# Patient Record
Sex: Female | Born: 1975 | Race: White | Hispanic: No | Marital: Married | Smoking: Current every day smoker
Health system: Southern US, Community
[De-identification: ages and names within clinical notes are randomized; demographics above are authoritative.]

## PROBLEM LIST (undated history)

## (undated) DIAGNOSIS — G43909 Migraine, unspecified, not intractable, without status migrainosus: Secondary | ICD-10-CM

## (undated) DIAGNOSIS — E7800 Pure hypercholesterolemia, unspecified: Secondary | ICD-10-CM

## (undated) DIAGNOSIS — I10 Essential (primary) hypertension: Secondary | ICD-10-CM

## (undated) DIAGNOSIS — J40 Bronchitis, not specified as acute or chronic: Secondary | ICD-10-CM

## (undated) DIAGNOSIS — E119 Type 2 diabetes mellitus without complications: Secondary | ICD-10-CM

## (undated) HISTORY — PX: OTHER SURGICAL HISTORY: SHX169

---

## 2009-10-31 ENCOUNTER — Emergency Department: Payer: Self-pay | Admitting: Emergency Medicine

## 2011-11-11 ENCOUNTER — Emergency Department: Payer: Self-pay | Admitting: *Deleted

## 2011-11-16 ENCOUNTER — Emergency Department: Payer: Self-pay

## 2012-06-07 ENCOUNTER — Emergency Department: Payer: Self-pay | Admitting: Emergency Medicine

## 2012-07-30 ENCOUNTER — Emergency Department: Payer: Self-pay | Admitting: Emergency Medicine

## 2012-08-27 ENCOUNTER — Emergency Department: Payer: Self-pay | Admitting: Internal Medicine

## 2012-11-28 ENCOUNTER — Emergency Department: Payer: Self-pay | Admitting: Emergency Medicine

## 2012-12-21 ENCOUNTER — Emergency Department: Payer: Self-pay | Admitting: Emergency Medicine

## 2013-01-14 ENCOUNTER — Emergency Department: Payer: Self-pay | Admitting: Emergency Medicine

## 2013-02-19 ENCOUNTER — Emergency Department: Payer: Self-pay | Admitting: Emergency Medicine

## 2013-03-18 ENCOUNTER — Emergency Department: Payer: Self-pay | Admitting: Emergency Medicine

## 2013-05-15 ENCOUNTER — Emergency Department: Payer: Self-pay | Admitting: Internal Medicine

## 2014-01-04 ENCOUNTER — Emergency Department: Payer: Self-pay | Admitting: Emergency Medicine

## 2014-02-13 ENCOUNTER — Emergency Department: Payer: Self-pay | Admitting: Internal Medicine

## 2014-04-09 ENCOUNTER — Emergency Department: Payer: Self-pay | Admitting: Emergency Medicine

## 2014-08-15 ENCOUNTER — Encounter: Payer: Self-pay | Admitting: Emergency Medicine

## 2014-08-15 ENCOUNTER — Emergency Department
Admission: EM | Admit: 2014-08-15 | Discharge: 2014-08-15 | Disposition: A | Discharge status: Home / Self Care | Payer: Self-pay | Attending: Emergency Medicine | Admitting: Emergency Medicine

## 2014-08-15 DIAGNOSIS — E119 Type 2 diabetes mellitus without complications: Secondary | ICD-10-CM | POA: Insufficient documentation

## 2014-08-15 DIAGNOSIS — K047 Periapical abscess without sinus: Secondary | ICD-10-CM | POA: Insufficient documentation

## 2014-08-15 DIAGNOSIS — Z720 Tobacco use: Secondary | ICD-10-CM | POA: Insufficient documentation

## 2014-08-15 DIAGNOSIS — K029 Dental caries, unspecified: Secondary | ICD-10-CM | POA: Insufficient documentation

## 2014-08-15 HISTORY — DX: Type 2 diabetes mellitus without complications: E11.9

## 2014-08-15 HISTORY — DX: Bronchitis, not specified as acute or chronic: J40

## 2014-08-15 MED ORDER — AMOXICILLIN 500 MG PO TABS: 500.0000 mg | ORAL_TABLET | Freq: Two times a day (BID) | ORAL | Status: DC

## 2014-08-15 MED ORDER — IBUPROFEN 800 MG PO TABS: 800.0000 mg | ORAL_TABLET | Freq: Three times a day (TID) | ORAL | Status: DC | PRN

## 2014-08-15 MED ORDER — LORATADINE-PSEUDOEPHEDRINE ER 5-120 MG PO TB12: 1.0000 | ORAL_TABLET | Freq: Two times a day (BID) | ORAL | Status: DC

## 2014-08-15 NOTE — ED Notes (Signed)
Pt. In from the front with c/o of dental pain.  Pt. Has many caries in mouth.  Pt. States hx of dental problems. Pt. States "I dont have money to see dentist right now". Pt. States difficulty chewing on lt side.  Pt. States pain is to upper lt. Side of mouth.

## 2014-08-15 NOTE — ED Provider Notes (Signed)
CSN: [id number]     Arrival date & time 08/15/14  1515 History   First MD Initiated Contact with Patient 08/15/14 1542     Chief Complaint  Patient presents with  . Dental Pain     (Consider location/radiation/quality/duration/timing/severity/associated sxs/prior Treatment) HPI patient complains of dental pain lasting over the last week states that she has no money to see a dentist rates her pain as about a 5-6 out of 10 nothing making particularly better or worse denies any fevers chills nausea vomiting facial swelling edema or dental fractures no other complaints of note this time  Past Medical History  Diagnosis Date  . Diabetes mellitus without complication   . Bronchitis    No past surgical history on file. No family history on file. History  Substance Use Topics  . Smoking status: Current Every Day Smoker -- 0.50 packs/day    Types: Cigarettes  . Smokeless tobacco: Not on file  . Alcohol Use: No   OB History    No data available     Review of Systems Review of systems negative 16 systems as best review the patient's upper noted in history of present illness Patient does note a cough for the last 2 weeks    Allergies  Morphine and related  Home Medications   Prior to Admission medications   Medication Sig Start Date End Date Taking? Authorizing Provider  amoxicillin (AMOXIL) 500 MG tablet Take 1 tablet (500 mg total) by mouth 2 (two) times daily. 08/15/14   Cielo Arias William C Ulyana Pitones, PA-C  ibuprofen (ADVIL,MOTRIN) 800 MG tablet Take 1 tablet (800 mg total) by mouth every 8 (eight) hours as needed. 08/15/14   Stephfon Bovey William C Haizley Cannella, PA-C  loratadine-pseudoephedrine (CLARITIN-D 12 HOUR) 5-120 MG per tablet Take 1 tablet by mouth 2 (two) times daily. 08/15/14 08/15/15  Delina Kruczek William C Moncia Annas, PA-C   BP 131/69 mmHg  Pulse 101  Temp(Src) 98.2 F (36.8 C) (Oral)  Resp 18  Ht 5\' 6"  (1.676 m)  Wt 175 lb (79.379 kg)  BMI 28.26 kg/m2  SpO2 98%  LMP 08/03/2014 Physical  Exam Physical exam vitals Caucasian female appearing stated age well-developed well-nourished in no acute distress vitals as listed above reviewed Head ears eyes nose neck and throat examination was unremarkable Mouth reveals multiple dental caries and dental abscess and what appears to be one of her last remaining molars and an overall poor dentition gingivitis Cardiovascular regular rate and rhythm no murmurs or gallops pulmonary lungs clear to auscultation bilaterally Skin appeared free of rash or disease  ED Course  Procedures (including critical care time) Labs Review Labs Reviewed - No data to display  Imaging Review No results found.   EKG Interpretation None      Diagnostic impression on this patient has dental abscess multiple dental caries overall poor dentition patient will be discharged with a prescription of amoxicillin and Motrin follow-up with a dentist as soon as possible  MDM   Final diagnoses:  Dental abscess  Caries involving multiple surfaces of tooth        Aneudy Champlain Rosalyn GessWilliam C Chidiebere Wynn, PA-C 08/15/14 1601  Governor Rooksebecca Lord, MD 08/15/14 [phone]

## 2014-08-15 NOTE — Discharge Instructions (Signed)
OPTIONS FOR DENTAL FOLLOW UP CARE ° °[place] [facility] Health and Human Services - Local Safety Net Dental Clinics °http://www.ncdhhs.gov/dph/oralhealth/services/safetynetclinics.htm °  °[facility] (336-562-3123) ° °[facility] [place] (919-933-9087) ° °[facility] (919-663-1744 ext [phone]) ° °[facility] (336-570-6415) ° °[facility] (919-968-2025) °This clinic caters to the indigent population and is on a lottery system. °Location: °[facility], [address], [place] °Clinic Hours: °Wednesdays from 6pm - 9pm, patients seen by a lottery system. °For dates, call or go to www.med.[facility].edu/[facility]/patients/Dental-[facility] °Services: °Cleanings, fillings and simple extractions. °Payment Options: °DENTAL WORK IS FREE OF CHARGE. Bring proof of income or support. °Best way to get seen: °Arrive at 5:15 pm - this is a lottery, NOT first come/first serve, so arriving earlier will not increase your chances of being seen. °  °  °[facility] °919-537-3737 °Select option 1 for emergencies °  °Location: °[facility], [address], [place] °Clinic Hours: °No walk-ins accepted - call the day before to schedule an appointment. °Check in times are 9:30 am and 1:30 pm. °Services: °Simple extractions, temporary fillings, pulpectomy/pulp debridement, uncomplicated abscess drainage. °Payment Options: °PAYMENT IS DUE AT THE TIME OF SERVICE.  Fee is usually $100-200, additional surgical procedures (e.g. abscess drainage) may be extra. °Cash, checks, Visa/MasterCard accepted.  Can file Medicaid if patient is covered for dental - patient should call case worker to check. °No discount for [facility] patients. °Best way to get seen: °MUST call the day before and get onto the schedule. Can usually be seen the next 1-2 days. No walk-ins accepted. °  °  °[facility] °[phone] °   °Location: °[facility], [address], [place] °Clinic Hours: °M, W, Th, F 8am or 1:30pm, Tues 9a or 1:30 - first come/first served. °Services: °Simple extractions, temporary fillings, uncomplicated abscess drainage.  You do not need to be an [place] resident. °Payment Options: °PAYMENT IS DUE AT THE TIME OF SERVICE. °Dental insurance, otherwise sliding scale - bring proof of income or support. °Depending on income and treatment needed, cost is usually $50-200. °Best way to get seen: °Arrive early as it is first come/first served. °  °  °[facility] °919-542-1641 °  °Location: °[address] °Clinic Hours: °Mon-Thu 8a-5p °Services: °Most basic dental services including extractions and fillings. °Payment Options: °PAYMENT IS DUE AT THE TIME OF SERVICE. °Sliding scale, up to 50% off - bring proof if income or support. °Medicaid with dental option accepted. °Best way to get seen: °Call to schedule an appointment, can usually be seen within 2 weeks OR they will try to see walk-ins - show up at 8a or 2p (you may have to wait). °  °  °[facility] °919-245-2435 °[place] RESIDENTS ONLY °  °Location: °[facility], [address], [place],  [address] °Clinic Hours: By appointment only. °Monday - Thursday 8am-5pm, Friday 8am-12pm °Services: Cleanings, fillings, extractions. °Payment Options: °PAYMENT IS DUE AT THE TIME OF SERVICE. °Cash, Visa or MasterCard. Sliding scale - $30 minimum per service. °Best way to get seen: °Come in to office, complete packet and make an appointment - need proof of income °or support monies for each household member and proof of [place] residence. °Usually takes about a month to get in. °  °  °[facility] °[phone] °  °Location: °[address]    [place] °Clinic Hours: Walk-in Urgent Care Dental Services are offered Monday-Friday  mornings only. °The numbers of emergencies accepted daily is limited to the number of °providers available. °Maximum 15 - Mondays, Wednesdays & Thursdays °Maximum 10 - Tuesdays & Fridays °Services: °You do not need to be a [place] resident to be seen for a dental emergency. °Emergencies are defined as pain, swelling, abnormal bleeding, or dental trauma. Walkins will receive x-rays if needed. °NOTE: Dental cleaning is not an emergency. °Payment Options: °PAYMENT IS DUE AT THE TIME OF SERVICE. °Minimum co-pay is $40.00 for uninsured patients. °Minimum co-pay is $3.00 for Medicaid with dental coverage. °Dental Insurance is accepted and must be presented at time of visit. °Medicare does not cover dental. °Forms of payment: Cash, credit card, checks. °Best way to get seen: °If not previously registered with the clinic, walk-in dental registration begins at 7:15 am and is on a first come/first serve basis. °If previously registered with the clinic, call to make an appointment. °  °  °[facility] °919-776-4359 °[place] RESIDENTS ONLY °  °Location: °[address], [place], [place] °Clinic Hours: °Mon-Thu 10a-2p °Services: Extractions only! °Payment Options: °FREE (donations accepted) - bring proof of income or support °Best way to get seen: °Call and schedule an appointment OR come at 8am on the 1st Monday of every month (except for holidays) when it is first come/first served. °  °  °[facility] °[phone] °  °Location: °[address], [place] °Clinic Hours: °Friday mornings °Services, Payment Options, Best way to get seen: °Call for info ° °Dental Abscess °A dental abscess is a collection of infected fluid (pus) from a bacterial infection in the inner part of the tooth (pulp). It usually occurs at the end of the tooth's root.  °CAUSES  °· Severe tooth decay. °· Trauma to the tooth that allows bacteria to enter into the pulp, such as a broken or chipped tooth. °SYMPTOMS  °· Severe pain in and  around the infected tooth. °· Swelling and redness around the abscessed tooth or in the mouth or face. °· Tenderness. °· Pus drainage. °· Bad breath. °· Bitter taste in the mouth. °· Difficulty swallowing. °· Difficulty opening the mouth. °· Nausea. °· Vomiting. °· Chills. °· Swollen neck glands. °DIAGNOSIS  °· A medical and dental history will be taken. °· An examination will be performed by tapping on the abscessed tooth. °· X-rays may be taken of the tooth to identify the abscess. °TREATMENT °The goal of treatment is to eliminate the infection. You may be prescribed antibiotic medicine to stop the infection from spreading. A root canal may be performed to save the tooth. If the tooth cannot be saved, it may be pulled (extracted) and the abscess may be drained.  °HOME CARE INSTRUCTIONS °· Only take over-the-counter or prescription medicines for pain, fever, or discomfort as directed by your caregiver. °· Rinse your mouth (gargle) often with salt water (¼ tsp salt in 8 oz [250 ml] of warm water) to relieve pain or swelling. °· Do not drive after taking pain medicine (narcotics). °· Do not apply heat to the outside of your face. °· Return to your dentist for further treatment as directed. °SEEK MEDICAL CARE IF: °· Your pain is not helped by medicine. °· Your pain is getting worse instead of better. °SEEK IMMEDIATE MEDICAL CARE IF: °· You have a fever or persistent symptoms for more than 2-3 days. °· You have a fever and your symptoms suddenly   worse.  You have chills or a very bad headache.  You have problems breathing or swallowing.  You have trouble opening your mouth.  You have swelling in the neck or around the eye. Document Released: 03/21/2005 Document Revised: 12/14/2011 Document Reviewed: 06/29/2010 [facility]ntExitCare Patient Information 2015 [place], [place]. This information is not intended to replace advice given to you by your health care provider. Make sure you discuss any questions you have with your  health care provider.  Dental Care and Dentist Visits Dental care supports good overall health. Regular dental visits can also help you avoid dental pain, bleeding, infection, and other more serious health problems in the future. It is important to keep the mouth healthy because diseases in the teeth, gums, and other oral tissues can spread to other areas of the body. Some problems, such as diabetes, heart disease, and pre-term labor have been associated with poor oral health.  See your dentist every 6 months. If you experience emergency problems such as a toothache or broken tooth, go to the dentist right away. If you see your dentist regularly, you may catch problems early. It is easier to be treated for problems in the early stages.  WHAT TO EXPECT AT A DENTIST VISIT  Your dentist will look for many common oral health problems and recommend proper treatment. At your regular dental visit, you can expect:  Gentle cleaning of the teeth and gums. This includes scraping and polishing. This helps to remove the sticky substance around the teeth and gums (plaque). Plaque forms in the mouth shortly after eating. Over time, plaque hardens on the teeth as tartar. If tartar is not removed regularly, it can cause problems. Cleaning also helps remove stains.  Periodic X-rays. These pictures of the teeth and supporting bone will help your dentist assess the health of your teeth.  Periodic fluoride treatments. Fluoride is a natural mineral shown to help strengthen teeth. Fluoride treatmentinvolves applying a fluoride gel or varnish to the teeth. It is most commonly done in children.  Examination of the mouth, tongue, jaws, teeth, and gums to look for any oral health problems, such as:  Cavities (dental caries). This is decay on the tooth caused by plaque, sugar, and acid in the mouth. It is best to catch a cavity when it is small.  Inflammation of the gums caused by plaque buildup (gingivitis).  Problems  with the mouth or malformed or misaligned teeth.  Oral cancer or other diseases of the soft tissues or jaws. KEEP YOUR TEETH AND GUMS HEALTHY For healthy teeth and gums, follow these general guidelines as well as your dentist's specific advice:  Have your teeth professionally cleaned at the dentist every 6 months.  Brush twice daily with a fluoride toothpaste.  Floss your teeth daily.  Ask your dentist if you need fluoride supplements, treatments, or fluoride toothpaste.  Eat a healthy diet. Reduce foods and drinks with added sugar.  Avoid smoking. TREATMENT FOR ORAL HEALTH PROBLEMS If you have oral health problems, treatment varies depending on the conditions present in your teeth and gums.  Your caregiver will most likely recommend good oral hygiene at each visit.  For cavities, gingivitis, or other oral health disease, your caregiver will perform a procedure to treat the problem. This is typically done at a separate appointment. Sometimes your caregiver will refer you to another dental specialist for specific tooth problems or for surgery. SEEK IMMEDIATE DENTAL CARE IF:  You have pain, bleeding, or soreness in the gum, tooth, jaw,  or mouth area.  A permanent tooth becomes loose or separated from the gum socket.  You experience a blow or injury to the mouth or jaw area. Document Released: 12/01/2010 Document Revised: 06/13/2011 Document Reviewed: 12/01/2010 [facility]Exit[facility] Patient Information 2015 [place], [place]LL[place]. This information is not intended to replace advice given to you by your health care provider. Make sure you discuss any questions you have with your health care provider.

## 2014-08-15 NOTE — ED Notes (Signed)
C/o left sided toothache x 3 days

## 2014-10-10 ENCOUNTER — Emergency Department
Admission: EM | Admit: 2014-10-10 | Discharge: 2014-10-10 | Disposition: A | Discharge status: Home / Self Care | Payer: Self-pay | Attending: Emergency Medicine | Admitting: Emergency Medicine

## 2014-10-10 DIAGNOSIS — Z91040 Latex allergy status: Secondary | ICD-10-CM | POA: Insufficient documentation

## 2014-10-10 DIAGNOSIS — H6692 Otitis media, unspecified, left ear: Secondary | ICD-10-CM | POA: Insufficient documentation

## 2014-10-10 DIAGNOSIS — H6121 Impacted cerumen, right ear: Secondary | ICD-10-CM | POA: Insufficient documentation

## 2014-10-10 DIAGNOSIS — E119 Type 2 diabetes mellitus without complications: Secondary | ICD-10-CM | POA: Insufficient documentation

## 2014-10-10 DIAGNOSIS — Z720 Tobacco use: Secondary | ICD-10-CM | POA: Insufficient documentation

## 2014-10-10 MED ORDER — AMOXICILLIN 500 MG PO CAPS: 500.0000 mg | ORAL_CAPSULE | Freq: Three times a day (TID) | ORAL | Status: DC

## 2014-10-10 NOTE — Discharge Instructions (Signed)
Cerumen Impaction A cerumen impaction is when the wax in your ear forms a plug. This plug usually causes reduced hearing. Sometimes it also causes an earache or dizziness. Removing a cerumen impaction can be difficult and painful. The wax sticks to the ear canal. The canal is sensitive and bleeds easily. If you try to remove a heavy wax buildup with a cotton tipped swab, you may push it in further. Irrigation with water, suction, and small ear curettes may be used to clear out the wax. If the impaction is fixed to the skin in the ear canal, ear drops may be needed for a few days to loosen the wax. People who build up a lot of wax frequently can use ear wax removal products available in your local drugstore. SEEK MEDICAL CARE IF:  You develop an earache, increased hearing loss, or marked dizziness. Document Released: 04/28/2004 Document Revised: 06/13/2011 Document Reviewed: 06/18/2009 [facility] CenterExitCare Patient Information 2015 [place], [place]LL[place]. This information is not intended to replace advice given to you by your health care provider. Make sure you discuss any questions you have with your health care provider.    USE DROPS FOR EAR WAX REMOVAL AS DIRECTED FOLLOW UP WITH [facility]

## 2014-10-10 NOTE — ED Notes (Signed)
Pt states that she started with a head cold last week and states that it has went into her ears, pt is unable to hear out of her rt ear and states the left ear is draining. Pt states that she is a little off balanced and a little nauseated

## 2014-10-10 NOTE — ED Provider Notes (Signed)
[facility] Emergency Department Provider Note  ____________________________________________  Time seen:  9:21 AM  I have reviewed the triage vital signs and the nursing notes.   HISTORY  Chief Complaint Ear Drainage   HPI Brittany Bates is a 39 y.o. female is here with complaint of ear pain.She states she had a head cold last week and has taken DayQuil for it. Now she has decreased hearing of her right ear and left ear has been draining. She has had tubes put in her ears before and has had a lot of ear infections in the past. Currently she is a little nauseous and a little dizzy. She rates her pain as 8/10. Sitting and improves her balance, nothing has helped her ear draining.   Past Medical History  Diagnosis Date  . Diabetes mellitus without complication   . Bronchitis     There are no active problems to display for this patient.   No past surgical history on file.  Current Outpatient Rx  Name  Route  Sig  Dispense  Refill  . amoxicillin (AMOXIL) 500 MG capsule   Oral   Take 1 capsule (500 mg total) by mouth 3 (three) times daily.   30 capsule   0     Allergies Latex and Morphine and related  No family history on file.  Social History History  Substance Use Topics  . Smoking status: Current Every Day Smoker -- 0.50 packs/day    Types: Cigarettes  . Smokeless tobacco: Not on file  . Alcohol Use: No    Review of Systems Constitutional: No fever/chills Eyes: No visual changes. ENT: No sore throat. Decreased hearing right ear draining left ear Cardiovascular: Denies chest pain. Respiratory: Denies shortness of breath. Gastrointestinal: No abdominal pain.  No nausea, no vomiting.  Genitourinary: Negative for dysuria. Musculoskeletal: Negative for back pain. Skin: Negative for rash. Neurological: Negative for headaches  10-point ROS otherwise negative.  ____________________________________________   PHYSICAL EXAM:  VITAL  SIGNS: ED Triage Vitals  Enc Vitals Group     BP 10/10/14 0906 143/88 mmHg     Pulse Rate 10/10/14 0906 116     Resp 10/10/14 0906 18     Temp 10/10/14 0906 98.4 F (36.9 C)     Temp Source 10/10/14 0906 Oral     SpO2 10/10/14 0906 96 %     Weight 10/10/14 0906 175 lb (79.379 kg)     Height 10/10/14 0906 $RemoveBefor'5\' 6"'laieSCbFEVlM$  (1.676 m)     Head Cir --      Peak Flow --      Pain Score 10/10/14 0907 8     Pain Loc --      Pain Edu? --      Excl. in [place]? --     Constitutional: Alert and oriented. Well appearing and in no acute distress. Eyes: Conjunctivae are normal. PERRL. EOMI. Head: Atraumatic. Nose: No congestion/rhinnorhea. Right EAC is occluded with cerumen. TM is not visible. Left EAC is clear left TM is red and dull. Neck: No stridor.  Supple Hematological/Lymphatic/Immunilogical: No cervical lymphadenopathy. Cardiovascular: Normal rate, regular rhythm. Grossly normal heart sounds.  Good peripheral circulation. Respiratory: Normal respiratory effort.  No retractions. Lungs CTAB. Gastrointestinal: Soft and nontender. No distention. Musculoskeletal: No lower extremity tenderness nor edema.  No joint effusions. Neurologic:  Normal speech and language. No gross focal neurologic deficits are appreciated. Speech is normal. No gait instability. Skin:  Skin is warm, dry and intact. No rash noted. Psychiatric:  Mood and affect are normal. Speech and behavior are normal.  ____________________________________________   LABS (all labs ordered are listed, but only abnormal results are displayed)  Labs Reviewed - No data to display   PROCEDURES  Procedure(s) performed: None  Critical Care performed: No  ____________________________________________   INITIAL IMPRESSION / ASSESSMENT AND PLAN / ED COURSE  Pertinent labs & imaging results that were available during my care of the patient were reviewed by me and considered in my medical decision making (see chart for details).  Patient was  given instructions to obtain over-the-counter earwax removal kit. She is also started on amoxicillin for her ear infection. She is to follow-up with [facility] if any continued problems. ____________________________________________   FINAL CLINICAL IMPRESSION(S) / ED DIAGNOSES  Final diagnoses:  Acute left otitis media, recurrence not specified, unspecified otitis media type  Cerumen impaction, right      Johnn Hai, PA-C 10/10/14 1107  Harvest Dark, MD 10/10/14 1425

## 2014-10-10 NOTE — ED Notes (Signed)
C/o bil lateral earache

## 2014-11-13 ENCOUNTER — Encounter: Payer: Self-pay | Admitting: Emergency Medicine

## 2014-11-13 ENCOUNTER — Emergency Department
Admission: EM | Admit: 2014-11-13 | Discharge: 2014-11-13 | Disposition: A | Discharge status: Home / Self Care | Payer: Self-pay | Attending: Emergency Medicine | Admitting: Emergency Medicine

## 2014-11-13 DIAGNOSIS — Z79899 Other long term (current) drug therapy: Secondary | ICD-10-CM | POA: Insufficient documentation

## 2014-11-13 DIAGNOSIS — H6121 Impacted cerumen, right ear: Secondary | ICD-10-CM | POA: Insufficient documentation

## 2014-11-13 DIAGNOSIS — Z720 Tobacco use: Secondary | ICD-10-CM | POA: Insufficient documentation

## 2014-11-13 DIAGNOSIS — Z792 Long term (current) use of antibiotics: Secondary | ICD-10-CM | POA: Insufficient documentation

## 2014-11-13 DIAGNOSIS — E119 Type 2 diabetes mellitus without complications: Secondary | ICD-10-CM | POA: Insufficient documentation

## 2014-11-13 NOTE — ED Notes (Signed)
Right ear pain for couple of days.

## 2014-11-13 NOTE — ED Provider Notes (Signed)
[facility] Emergency Department Provider Note  ____________________________________________  Time seen: Approximately 8:32 AM  I have reviewed the triage vital signs and the nursing notes.   HISTORY  Chief Complaint Otalgia   HPI Brittany Bates is a 39 y.o. female right ear pain for several days. Patient states she has not had any fever. She states she has a history of ear problems and while her husband was here thought she would have it checked out. She denies any upper respiratory symptoms. Currently her ear pain is proximal medial 5 out of 10. She does not appear to be any distress. She is not taking any over-the-counter medication for her ear pain.   Past Medical History  Diagnosis Date  . Diabetes mellitus without complication   . Bronchitis     There are no active problems to display for this patient.   History reviewed. No pertinent past surgical history.  Current Outpatient Rx  Name  Route  Sig  Dispense  Refill  . loratadine (CLARITIN) 10 MG tablet   Oral   Take 10 mg by mouth daily.         Marland Kitchen amoxicillin (AMOXIL) 500 MG capsule   Oral   Take 1 capsule (500 mg total) by mouth 3 (three) times daily.   30 capsule   0     Allergies Latex and Morphine and related  No family history on file.  Social History Social History  Substance Use Topics  . Smoking status: Current Every Day Smoker -- 0.50 packs/day    Types: Cigarettes  . Smokeless tobacco: None  . Alcohol Use: No    Review of Systems Constitutional: No fever/chills ENT: No sore throat. Cardiovascular: Denies chest pain. Respiratory: Denies shortness of breath. Gastrointestinal: No abdominal pain.  No nausea, no vomiting.   Musculoskeletal: Negative for back pain. Skin: Negative for rash. Neurological: Negative for headaches, focal weakness or numbness.  10-point ROS otherwise negative.  ____________________________________________   PHYSICAL EXAM:  VITAL  SIGNS: ED Triage Vitals  Enc Vitals Group     BP 11/13/14 0822 143/75 mmHg     Pulse Rate 11/13/14 0822 93     Resp --      Temp 11/13/14 0822 97.9 F (36.6 C)     Temp src --      SpO2 11/13/14 0822 100 %     Weight 11/13/14 0822 175 lb (79.379 kg)     Height 11/13/14 0822 $RemoveBefor'5\' 6"'phFapvphKXrF$  (1.676 m)     Head Cir --      Peak Flow --      Pain Score --      Pain Loc --      Pain Edu? --      Excl. in [place]? --     Constitutional: Alert and oriented. Well appearing and in no acute distress. Eyes: Conjunctivae are normal. PERRL. EOMI. Head: Atraumatic.  Right EAC moderate cerumen impaction. Left EAC and TM clear. Nose: No congestion/rhinnorhea. Mouth/Throat: Mucous membranes are moist.  Oropharynx non-erythematous. Neck: No stridor.  Supple Hematological/Lymphatic/Immunilogical: No cervical lymphadenopathy. Cardiovascular: Normal rate, regular rhythm. Grossly normal heart sounds.  Good peripheral circulation. Respiratory: Normal respiratory effort.  No retractions. Lungs CTAB. Gastrointestinal: Soft and nontender. No distention.  Musculoskeletal: No lower extremity tenderness nor edema.  No joint effusions. Neurologic:  Normal speech and language. No gross focal neurologic deficits are appreciated. No gait instability. Skin:  Skin is warm, dry and intact. No rash noted. Psychiatric: Mood and affect  are normal. Speech and behavior are normal.  ____________________________________________   LABS (all labs ordered are listed, but only abnormal results are displayed)  Labs Reviewed - No data to display  PROCEDURES  Procedure(s) performed: None  Critical Bates performed: No  ____________________________________________   INITIAL IMPRESSION / ASSESSMENT AND PLAN / ED COURSE  Pertinent labs & imaging results that were available during my Bates of the patient were reviewed by me and considered in my medical decision making (see chart for details).  Patient was told to get a earwax  removal kit at the drugstore. She is to continue using 3 drops for 3 days. If there is no improvement she is to follow-up with Dr. Pryor Ochoa at [facility]. ____________________________________________   FINAL CLINICAL IMPRESSION(S) / ED DIAGNOSES  Final diagnoses:  Cerumen impaction, right      Johnn Hai, PA-C 11/13/14 1014  Ahmed Prima, MD 11/13/14 [phone]

## 2014-11-13 NOTE — Discharge Instructions (Signed)
Cerumen Impaction °A cerumen impaction is when the wax in your ear forms a plug. This plug usually causes reduced hearing. Sometimes it also causes an earache or dizziness. Removing a cerumen impaction can be difficult and painful. The wax sticks to the ear canal. The canal is sensitive and bleeds easily. If you try to remove a heavy wax buildup with a cotton tipped swab, you may push it in further. °Irrigation with water, suction, and small ear curettes may be used to clear out the wax. If the impaction is fixed to the skin in the ear canal, ear drops may be needed for a few days to loosen the wax. People who build up a lot of wax frequently can use ear wax removal products available in your local drugstore. °SEEK MEDICAL CARE IF:  °You develop an earache, increased hearing loss, or marked dizziness. °Document Released: 04/28/2004 Document Revised: 06/13/2011 Document Reviewed: 06/18/2009 °ExitCare® Patient Information ©2015 [employer]. This information is not intended to replace advice given to you by your health care provider. Make sure you discuss any questions you have with your health care provider. ° ° ° ° °GET EAR WAX REMOVAL KIT FROM DRUG STORE AND USE 3 DAYS IN A ROW °FOLLOW UP WITH DR. VAUGHT IF ANY CONTINUED PROBLEMS  °

## 2015-02-18 ENCOUNTER — Encounter: Payer: Self-pay | Admitting: Emergency Medicine

## 2015-02-18 ENCOUNTER — Emergency Department
Admission: EM | Admit: 2015-02-18 | Discharge: 2015-02-18 | Disposition: A | Discharge status: Home / Self Care | Payer: Self-pay | Attending: Emergency Medicine | Admitting: Emergency Medicine

## 2015-02-18 DIAGNOSIS — Z79899 Other long term (current) drug therapy: Secondary | ICD-10-CM | POA: Insufficient documentation

## 2015-02-18 DIAGNOSIS — J069 Acute upper respiratory infection, unspecified: Secondary | ICD-10-CM | POA: Insufficient documentation

## 2015-02-18 DIAGNOSIS — M25571 Pain in right ankle and joints of right foot: Secondary | ICD-10-CM | POA: Insufficient documentation

## 2015-02-18 DIAGNOSIS — T8484XA Pain due to internal orthopedic prosthetic devices, implants and grafts, initial encounter: Secondary | ICD-10-CM | POA: Insufficient documentation

## 2015-02-18 DIAGNOSIS — Z792 Long term (current) use of antibiotics: Secondary | ICD-10-CM | POA: Insufficient documentation

## 2015-02-18 DIAGNOSIS — F17210 Nicotine dependence, cigarettes, uncomplicated: Secondary | ICD-10-CM | POA: Insufficient documentation

## 2015-02-18 DIAGNOSIS — K029 Dental caries, unspecified: Secondary | ICD-10-CM | POA: Insufficient documentation

## 2015-02-18 DIAGNOSIS — E119 Type 2 diabetes mellitus without complications: Secondary | ICD-10-CM | POA: Insufficient documentation

## 2015-02-18 DIAGNOSIS — T85848A Pain due to other internal prosthetic devices, implants and grafts, initial encounter: Secondary | ICD-10-CM

## 2015-02-18 DIAGNOSIS — Y658 Other specified misadventures during surgical and medical care: Secondary | ICD-10-CM | POA: Insufficient documentation

## 2015-02-18 DIAGNOSIS — Z91040 Latex allergy status: Secondary | ICD-10-CM | POA: Insufficient documentation

## 2015-02-18 MED ORDER — PENICILLIN V POTASSIUM 500 MG PO TABS: 500.0000 mg | ORAL_TABLET | Freq: Four times a day (QID) | ORAL | Status: DC

## 2015-02-18 MED ORDER — TRAMADOL HCL 50 MG PO TABS: 50.0000 mg | ORAL_TABLET | Freq: Four times a day (QID) | ORAL | Status: DC | PRN

## 2015-02-18 NOTE — Discharge Instructions (Signed)
Please take your medications as prescribed for their entire course. Please follow-up with the local dental clinics by calling the numbers provided. Please take Tylenol or Motrin as needed for discomfort. Respiratory infection. You may also take over-the-counter medications such as pseudoephedrine or phenylephrine as needed for nasal congestion, as written on the box.   OPTIONS FOR DENTAL FOLLOW UP CARE  [place] [facility] and [facility] Services - Local Safety Net Dental Clinics [email or web address]   [facility] [phone])  Sharl Ma [phone])  [place] [phone])  [facility] [phone])  [facility] [phone]) This clinic caters to the indigent population and is on a lottery system. Location: [employer] [facility], [employer], [address], [place] Clinic Hours: Wednesdays from 6pm - 9pm, patients seen by a lottery system. For dates, call or go to [email or web address] Services: Cleanings, fillings and simple extractions. Payment Options: DENTAL WORK IS FREE OF CHARGE. Bring proof of income or support. Best way to get seen: Arrive at 5:15 pm - this is a lottery, NOT first come/first serve, so arriving earlier will not increase your chances of being seen.     [facility] [phone] Select option 1 for emergencies   Location: [facility], [place], [address] [place] Clinic Hours: No walk-ins accepted - call the day before to schedule an appointment. Check in times are 9:30 am and 1:30 pm. Services: Simple extractions, temporary fillings, pulpectomy/pulp debridement, uncomplicated abscess drainage. Payment Options: PAYMENT IS DUE AT THE TIME OF SERVICE.  Fee is usually $100-200, additional surgical procedures (e.g. abscess drainage) may be  extra. Cash, checks, Visa/MasterCard accepted.  Can file Medicaid if patient is covered for dental - patient should call case worker to check. No discount for [facility] patients. Best way to get seen: MUST call the day before and get onto the schedule. Can usually be seen the next 1-2 days. No walk-ins accepted.     [facility] [phone]   Location: [facility], [address] [place] Clinic Hours: M, W, Th, F 8am or 1:30pm, Tues 9a or 1:30 - first come/first served. Services: Simple extractions, temporary fillings, uncomplicated abscess drainage.  You do not need to be an [facility] resident. Payment Options: PAYMENT IS DUE AT THE TIME OF SERVICE. Dental insurance, otherwise sliding scale - bring proof of income or support. Depending on income and treatment needed, cost is usually $50-200. Best way to get seen: Arrive early as it is first come/first served.     [facility] [phone]   Location: [address] Clinic Hours: Mon-Thu 8a-5p Services: Most basic dental services including extractions and fillings. Payment Options: PAYMENT IS DUE AT THE TIME OF SERVICE. Sliding scale, up to 50% off - bring proof if income or support. Medicaid with dental option accepted. Best way to get seen: Call to schedule an appointment, can usually be seen within 2 weeks OR they will try to see walk-ins - show up at 8a or 2p (you may have to wait).     [facility] [phone] [place] RESIDENTS ONLY   Location: [facility], [address] [place], [place] [address] Clinic Hours: By appointment only. Monday - Thursday 8am-5pm, Friday 8am-12pm Services: Cleanings, fillings, extractions. Payment Options: PAYMENT IS DUE AT THE TIME OF SERVICE. Cash, Visa or MasterCard. Sliding scale - $30 minimum per service. Best  way to get seen: Come in to office,  complete packet and make an appointment - need proof of income or support monies for each household member and proof of [facility] [place] [place] residence. Usually takes about a month to get in.     [facility] [phone]   Location: [address] [facility] Hours: Walk-in Urgent Care Dental Services are offered Monday-Friday mornings only. The numbers of emergencies accepted daily is limited to the number of providers available. Maximum 15 - Mondays, Wednesdays & Thursdays Maximum 10 - Tuesdays & Fridays Services: You do not need to be [place] resident to be seen for a dental emergency. Emergencies are defined as pain, swelling, abnormal bleeding, or dental trauma. Walkins will receive x-rays if needed. NOTE: Dental cleaning is not an emergency. Payment Options: PAYMENT IS DUE AT THE TIME OF SERVICE. Minimum co-pay is $40.00 for uninsured patients. Minimum co-pay is $3.00 for Medicaid with dental coverage. Dental Insurance is accepted and must be presented at time of visit. Medicare does not cover dental. Forms of payment: Cash, credit card, checks. Best way to get seen: If not previously registered with the clinic, walk-in dental registration begins at 7:15 am and is on a first come/first serve basis. If previously registered with the clinic, call to make an appointment.     [facility] [phone] [place] RESIDENTS ONLY   Location: [address], [place], [place] Clinic Hours: Mon-Thu 10a-2p Services: Extractions only! Payment Options: FREE (donations accepted) - bring proof of income or support Best way to get seen: Call and schedule an appointment OR come at 8am on the 1st Monday of every month (except for holidays) when it is first come/first served.     [facility] [phone]   Location: [address], [place] Clinic Hours: Friday mornings Services, Payment Options, Best way to get seen: Call for  info    Upper Respiratory Infection, Adult Most upper respiratory infections (URIs) are caused by a virus. A URI affects the nose, throat, and upper air passages. The most common type of URI is often called "the common cold." HOME CARE   Take medicines only as told by your doctor.  Gargle warm saltwater or take cough drops to comfort your throat as told by your doctor.  Use a warm mist humidifier or inhale steam from a shower to increase air moisture. This may make it easier to breathe.  Drink enough fluid to keep your pee (urine) clear or pale yellow.  Eat soups and other clear broths.  Have a healthy diet.  Rest as needed.  Go back to work when your fever is gone or your doctor says it is okay.  You may need to stay home longer to avoid giving your URI to others.  You can also wear a face mask and wash your hands often to prevent spread of the virus.  Use your inhaler more if you have asthma.  Do not use any tobacco products, including cigarettes, chewing tobacco, or electronic cigarettes. If you need help quitting, ask your doctor. GET HELP IF:  You are getting worse, not better.  Your symptoms are not helped by medicine.  You have chills.  You are getting more short of breath.  You have brown or red mucus.  You have yellow or brown discharge from your nose.  You have pain in your face, especially when you bend forward.  You have a fever.  You have puffy (swollen)  neck glands.  You have pain while swallowing.  You have white areas in the back of your throat. GET HELP RIGHT AWAY IF:   You have very bad or constant:  Headache.  Ear pain.  Pain in your forehead, behind your eyes, and over your cheekbones (sinus pain).  Chest pain.  You have long-lasting (chronic) lung disease and any of the following:  Wheezing.  Long-lasting cough.  Coughing up blood.  A change in your usual mucus.  You have a stiff neck.  You have changes in  your:  Vision.  Hearing.  Thinking.  Mood. MAKE SURE YOU:   Understand these instructions.  Will watch your condition.  Will get help right away if you are not doing well or get worse.   This information is not intended to replace advice given to you by your health care provider. Make sure you discuss any questions you have with your health care provider.   Document Released: 09/07/2007 Document Revised: 08/05/2014 Document Reviewed: 06/26/2013 Elsevier Interactive Patient Education [employer]

## 2015-02-18 NOTE — ED Provider Notes (Signed)
[facility] Emergency Department Provider Note  Time seen: 12:41 PM  I have reviewed the triage vital signs and the nursing notes.   HISTORY  Chief Complaint Dental Pain and URI    HPI Brittany Bates is a 39 y.o. female with a past medical history of diabetes and bronchitis who presents the emergency department with an upper respiratory infection, as well as right ankle pain. According to the patient for the past 1 week she's had cough, congestion. Denies fever. States her symptoms have not improved for one week. The last 3 days she has also had right lower molar dental pain. Cannot afford a dentist per patient.Describes her respiratory symptoms as mild to moderate. Describes her tooth pain is moderate. Dull/aching in the right lower mouth.     Past Medical History  Diagnosis Date  . Diabetes mellitus without complication (HCC)   . Bronchitis     There are no active problems to display for this patient.   History reviewed. No pertinent past surgical history.  Current Outpatient Rx  Name  Route  Sig  Dispense  Refill  . amoxicillin (AMOXIL) 500 MG capsule   Oral   Take 1 capsule (500 mg total) by mouth 3 (three) times daily.   30 capsule   0   . loratadine (CLARITIN) 10 MG tablet   Oral   Take 10 mg by mouth daily.           Allergies Latex and Morphine and related  No family history on file.  Social History Social History  Substance Use Topics  . Smoking status: Current Every Day Smoker -- 0.50 packs/day    Types: Cigarettes  . Smokeless tobacco: None  . Alcohol Use: No    Review of Systems Constitutional: Negative for fever. ENT: Positive for nasal congestion. Cardiovascular: Negative for chest pain. Respiratory: Negative for shortness of breath. Positive for cough. Gastrointestinal: Negative for abdominal pain Musculoskeletal: Negative for back pain. Neurological: Negative for headache 10-point ROS otherwise  negative.  ____________________________________________   PHYSICAL EXAM:  VITAL SIGNS: ED Triage Vitals  Enc Vitals Group     BP 02/18/15 1223 128/82 mmHg     Pulse Rate 02/18/15 1223 93     Resp 02/18/15 1223 16     Temp 02/18/15 1223 98.4 F (36.9 C)     Temp Source 02/18/15 1223 Oral     SpO2 02/18/15 1223 99 %     Weight 02/18/15 1223 175 lb (79.379 kg)     Height 02/18/15 1223 5\' 6"  (1.676 m)     Head Cir --      Peak Flow --      Pain Score 02/18/15 1224 8     Pain Loc --      Pain Edu? --      Excl. in [place]? --     Constitutional: Alert and oriented. Well appearing and in no distress. Eyes: Normal exam ENT   Head: Normocephalic and atraumatic.   Nose: Mild congestion.   Mouth/Throat: Mucous membranes are moist. No pharyngeal erythema. Poor dentition overall. Decayed tooth in the right lower mouth, no signs of abscess. Cardiovascular: Normal rate, regular rhythm. No murmur Respiratory: Normal respiratory effort without tachypnea nor retractions. Breath sounds are clear  Gastrointestinal: Soft and nontender.  Musculoskeletal: Ambulates without difficulty. Neurologic:  Normal speech and language. No gross focal neurologic deficits   Skin:  Skin is warm, dry and intact.  Psychiatric: Mood and affect are normal.  ____________________________________________    INITIAL IMPRESSION / ASSESSMENT AND PLAN / ED COURSE  Pertinent labs & imaging results that were available during my care of the patient were reviewed by me and considered in my medical decision making (see chart for details).  Patient with signs and symptoms most suggestive of an upper respiratory infection likely viral. Patient also has moderate tooth tenderness to palpation of the right lower molar. The tooth appears to be fractured/decayed in this area. No signs of abscess. We'll place the patient on penicillin, I will provide a list of dental clinics to the patient. We will also prescribe a short  course of Ultram as needed for dental discomfort.  ____________________________________________   FINAL CLINICAL IMPRESSION(S) / ED DIAGNOSES  Dental pain Upper respiratory infection   Minna Antis, MD 02/18/15 1244

## 2015-02-18 NOTE — ED Notes (Signed)
Pt states she has had a cold for a week and now states pain in her right side of her mouth, states she believes she has an abscess tooth

## 2015-02-18 NOTE — ED Notes (Signed)
Pt reports runny nose, sore throat, cough x1 week; pt also reports abscessed tooth. Pt in no respiratory distress in triage.

## 2015-04-03 ENCOUNTER — Encounter: Payer: Self-pay | Admitting: *Deleted

## 2015-04-03 ENCOUNTER — Emergency Department
Admission: EM | Admit: 2015-04-03 | Discharge: 2015-04-03 | Disposition: A | Discharge status: Home / Self Care | Payer: Self-pay | Attending: Emergency Medicine | Admitting: Emergency Medicine

## 2015-04-03 DIAGNOSIS — F17210 Nicotine dependence, cigarettes, uncomplicated: Secondary | ICD-10-CM | POA: Insufficient documentation

## 2015-04-03 DIAGNOSIS — R220 Localized swelling, mass and lump, head: Secondary | ICD-10-CM

## 2015-04-03 DIAGNOSIS — K0263 Dental caries on smooth surface penetrating into pulp: Secondary | ICD-10-CM | POA: Insufficient documentation

## 2015-04-03 DIAGNOSIS — Z91040 Latex allergy status: Secondary | ICD-10-CM | POA: Insufficient documentation

## 2015-04-03 DIAGNOSIS — Z79899 Other long term (current) drug therapy: Secondary | ICD-10-CM | POA: Insufficient documentation

## 2015-04-03 DIAGNOSIS — E119 Type 2 diabetes mellitus without complications: Secondary | ICD-10-CM | POA: Insufficient documentation

## 2015-04-03 DIAGNOSIS — K029 Dental caries, unspecified: Secondary | ICD-10-CM

## 2015-04-03 MED ORDER — PENICILLIN V POTASSIUM 500 MG PO TABS: 500.0000 mg | ORAL_TABLET | Freq: Four times a day (QID) | ORAL | Status: DC

## 2015-04-03 MED ORDER — TRAMADOL HCL 50 MG PO TABS: 50.0000 mg | ORAL_TABLET | Freq: Two times a day (BID) | ORAL | Status: DC

## 2015-04-03 NOTE — Discharge Instructions (Signed)
Dental Caries Dental caries is tooth decay. This decay can cause a hole in teeth (cavity) that can get bigger and deeper over time. HOME CARE  Brush and floss your teeth. Do this at least two times a day.  Use a fluoride toothpaste.  Use a mouth rinse if told by your dentist or doctor.  Eat less sugary and starchy foods. Drink less sugary drinks.  Avoid snacking often on sugary and starchy foods. Avoid sipping often on sugary drinks.  Keep regular checkups and cleanings with your dentist.  Use fluoride supplements if told by your dentist or doctor.  Allow fluoride to be applied to teeth if told by your dentist or doctor.   This information is not intended to replace advice given to you by your health care provider. Make sure you discuss any questions you have with your health care provider.   Document Released: 12/29/2007 Document Revised: 04/11/2014 Document Reviewed: 03/23/2012 Elsevier Interactive Patient Education 2016 Elsevier Inc.  Dental Pain Dental pain may be caused by many things, including:  Tooth decay (cavities or caries). Cavities cause the nerve of your tooth to be open to air and hot or cold temperatures. This can cause pain or discomfort.  Abscess or infection. A dental abscess is an area that is full of infected pus from a bacterial infection in the inner part of the tooth (pulp). It usually happens at the end of the tooth's root.  Injury.  An unknown reason (idiopathic). Your pain may be mild or severe. It may only happen when:  You are chewing.  You are exposed to hot or cold temperature.  You are eating or drinking sugary foods or beverages, such as:  Soda.  Candy. Your pain may also be there all of the time. HOME CARE Watch your dental pain for any changes. Do these things to lessen your discomfort:  Take medicines only as told by your dentist.  If your dentist tells you to take an antibiotic medicine, finish all of it even if you start to  feel better.  Keep all follow-up visits as told by your dentist. This is important.  Do not apply heat to the outside of your face.  Rinse your mouth or gargle with salt water if told by your dentist. This helps with pain and swelling.  You can make salt water by adding  tsp of salt to 1 cup of warm water.  Apply ice to the painful area of your face:  Put ice in a plastic bag.  Place a towel between your skin and the bag.  Leave the ice on for 20 minutes, 2-3 times per day.  Avoid foods or drinks that cause you pain, such as:  Very hot or very cold foods or drinks.  Sweet or sugary foods or drinks. GET HELP IF:  Your pain is not helped with medicines.  Your symptoms are worse.  You have new symptoms. GET HELP RIGHT AWAY IF:  You cannot open your mouth.  You are having trouble breathing or swallowing.  You have a fever.  Your face, neck, or jaw is puffy (swollen).   This information is not intended to replace advice given to you by your health care provider. Make sure you discuss any questions you have with your health care provider.   Document Released: 09/07/2007 Document Revised: 08/05/2014 Document Reviewed: 03/17/2014 Elsevier Interactive Patient Education [employer] Elsevier Inc.  Take the antibiotic as directed. See one of the dental clinics listed below.  OPTIONS FOR  DENTAL FOLLOW UP CARE  [place] [facility] - Local Safety Net Dental Clinics TripDoors.com.htm   [facility] [phone])  [facility] [phone])  [place] [phone])  [facility] [phone])  [facility] [phone]) This clinic caters to the indigent population and is on a lottery system. Location: [employer] [facility], [employer], [address] [place] Clinic Hours: Wednesdays from 6pm - 9pm, patients seen by a  lottery system. For dates, call or go to [email or web address] Services: Cleanings, fillings and simple extractions. Payment Options: DENTAL WORK IS FREE OF CHARGE. Bring proof of income or support. Best way to get seen: Arrive at 5:15 pm - this is a lottery, NOT first come/first serve, so arriving earlier will not increase your chances of being seen.     [facility] [phone] Select option 1 for emergencies   Location: [facility], [place], [address] [place] Clinic Hours: No walk-ins accepted - call the day before to schedule an appointment. Check in times are 9:30 am and 1:30 pm. Services: Simple extractions, temporary fillings, pulpectomy/pulp debridement, uncomplicated abscess drainage. Payment Options: PAYMENT IS DUE AT THE TIME OF SERVICE.  Fee is usually $100-200, additional surgical procedures (e.g. abscess drainage) may be extra. Cash, checks, Visa/MasterCard accepted.  Can file Medicaid if patient is covered for dental - patient should call case worker to check. No discount for [facility] patients. Best way to get seen: MUST call the day before and get onto the schedule. Can usually be seen the next 1-2 days. No walk-ins accepted.     [facility] [phone]   Location: [facility], [address], [place] Clinic Hours: M, W, Th, F 8am or 1:30pm, Tues 9a or 1:30 - first come/first served. Services: Simple extractions, temporary fillings, uncomplicated abscess drainage.  You do not need to be an [facility] resident. Payment Options: PAYMENT IS DUE AT THE TIME OF SERVICE. Dental insurance, otherwise sliding scale - bring proof of income or support. Depending on income and treatment needed, cost is usually $50-200. Best way to get seen: Arrive early as it is first come/first served.     [facility] [phone]   Location: [address] Clinic Hours: Mon-Thu 8a-5p Services: Most basic dental services including extractions and fillings. Payment Options: PAYMENT IS DUE AT THE TIME OF SERVICE. Sliding scale, up to 50% off - bring proof if income or support. Medicaid with dental option accepted. Best way to get seen: Call to schedule an appointment, can usually be seen within 2 weeks OR they will try to see walk-ins - show up at 8a or 2p (you may have to wait).     [facility] [phone] [place] RESIDENTS ONLY   Location: [facility], [address] [place], [place] [address] Clinic Hours: By appointment only. Monday - Thursday 8am-5pm, Friday 8am-12pm Services: Cleanings, fillings, extractions. Payment Options: PAYMENT IS DUE AT THE TIME OF SERVICE. Cash, Visa or MasterCard. Sliding scale - $30 minimum per service. Best way to get seen: Come in to office, complete packet and make an appointment - need proof of income or support monies for each household member and proof of [facility] residence. Usually takes about a month to get in.     [facility] [phone]   Location: [address], [place]  Clinic Hours: Walk-in Urgent Care Dental Services are offered Monday-Friday mornings only. The numbers of emergencies accepted daily is limited to the number of providers available. Maximum 15 - Mondays, Wednesdays & Thursdays Maximum 10 - Tuesdays & Fridays Services: You do not need to be a [facility] resident to be seen for a dental emergency. Emergencies are defined as pain, swelling, abnormal bleeding, or dental trauma. Walkins will receive x-rays if needed. NOTE: Dental cleaning is not an emergency. Payment Options: PAYMENT IS DUE AT THE TIME OF SERVICE. Minimum co-pay is $40.00 for uninsured patients. Minimum co-pay is $3.00 for Medicaid with dental  coverage. Dental Insurance is accepted and must be presented at time of visit. Medicare does not cover dental. Forms of payment: Cash, credit card, checks. Best way to get seen: If not previously registered with the clinic, walk-in dental registration begins at 7:15 am and is on a first come/first serve basis. If previously registered with the clinic, call to make an appointment.     [facility] [phone] [place] RESIDENTS ONLY   Location: [address], [place], [place] Clinic Hours: Mon-Thu 10a-2p Services: Extractions only! Payment Options: FREE (donations accepted) - bring proof of income or support Best way to get seen: Call and schedule an appointment OR come at 8am on the 1st Monday of every month (except for holidays) when it is first come/first served.     [facility] [phone]   Location: [address] [place], [place] Clinic Hours: Friday mornings Services, Payment Options, Best way to get seen: Call for info

## 2015-04-03 NOTE — ED Notes (Signed)
States she developed some swelling under left eye and to left side of face couple of days

## 2015-04-08 NOTE — ED Provider Notes (Signed)
[facility] Emergency Department Provider Note ____________________________________________  Time seen: 0940  I have reviewed the triage vital signs and the nursing notes.  HISTORY  Chief Complaint  Facial Swelling  HPI Brittany Bates is a 40 y.o. female presents to the ED with 3 day complaint of swelling to the left side of the face. She denies any interim fevers, chills, sweats. She does note some poor dentition on that side. She denies any other symptoms including facial trauma, or purulent discharge into the mouth.She rates her discomfort an 8/10 in triage.  Past Medical History  Diagnosis Date  . Diabetes mellitus without complication (HCC)   . Bronchitis     There are no active problems to display for this patient.   History reviewed. No pertinent past surgical history.  Current Outpatient Rx  Name  Route  Sig  Dispense  Refill  . loratadine (CLARITIN) 10 MG tablet   Oral   Take 10 mg by mouth daily.         . penicillin v potassium (VEETID) 500 MG tablet   Oral   Take 1 tablet (500 mg total) by mouth 4 (four) times daily.   40 tablet   0   . traMADol (ULTRAM) 50 MG tablet   Oral   Take 1 tablet (50 mg total) by mouth 2 (two) times daily.   10 tablet   0    Allergies Latex and Morphine and related  No family history on file.  Social History Social History  Substance Use Topics  . Smoking status: Current Every Day Smoker -- 0.50 packs/day    Types: Cigarettes  . Smokeless tobacco: None  . Alcohol Use: No   Review of Systems  Constitutional: Negative for fever. Eyes: Negative for visual changes. ENT: Negative for sore throat. Facial swelling and dental pain Cardiovascular: Negative for chest pain. Respiratory: Negative for shortness of breath. Gastrointestinal: Negative for abdominal pain, vomiting and diarrhea. Genitourinary: Negative for dysuria. Musculoskeletal: Negative for back pain. Skin: Negative for  rash. Neurological: Negative for headaches, focal weakness or numbness. ____________________________________________  PHYSICAL EXAM:  VITAL SIGNS: ED Triage Vitals  Enc Vitals Group     BP 04/03/15 0849 148/85 mmHg     Pulse Rate 04/03/15 0849 111     Resp 04/03/15 0849 24     Temp 04/03/15 0849 98 F (36.7 C)     Temp Source 04/03/15 0849 Oral     SpO2 04/03/15 0849 99 %     Weight 04/03/15 0849 165 lb (74.844 kg)     Height 04/03/15 0849 5\' 6"  (1.676 m)     Head Cir --      Peak Flow --      Pain Score 04/03/15 0850 8     Pain Loc --      Pain Edu? --      Excl. in GC? --    Constitutional: Alert and oriented. Well appearing and in no distress. Head: Normocephalic and atraumatic, except for some mild swelling to the left nasolabial fold.       Eyes: Conjunctivae are normal. PERRL. Normal extraocular movements      Ears: Canals clear. TMs intact bilaterally.   Nose: No congestion/rhinorrhea.   Mouth/Throat: Mucous membranes are moist. Uvula midline. Tonsils flat. Poor dentition throughout.    Neck: Supple. No thyromegaly. Hematological/Lymphatic/Immunological: No cervical lymphadenopathy. Cardiovascular: Normal rate, regular rhythm.  Respiratory: Normal respiratory effort. No wheezes/rales/rhonchi. Gastrointestinal: Soft and nontender. No distention. Musculoskeletal: Nontender  with normal range of motion in all extremities.  Neurologic:  Normal gait without ataxia. Normal speech and language. No gross focal neurologic deficits are appreciated. Skin:  Skin is warm, dry and intact. No rash noted. Psychiatric: Mood and affect are normal. Patient exhibits appropriate insight and judgment. ____________________________________________  INITIAL IMPRESSION / ASSESSMENT AND PLAN / ED COURSE  Acute dental pain and left facial swelling secondary to dental caries and dental abscess. Patient will be discharged with prescriptions for pen VK as well as tramadol dose as  directed. She is also advised to follow with local dental providers on the list given. ____________________________________________  FINAL CLINICAL IMPRESSION(S) / ED DIAGNOSES  Final diagnoses:  Left facial swelling  Dental decay  Dental caries extending into pulp      Lissa Hoard, PA-C 04/08/15 1642  Sharman Cheek, MD 04/09/15 2328

## 2015-04-21 ENCOUNTER — Emergency Department
Admission: EM | Admit: 2015-04-21 | Discharge: 2015-04-22 | Disposition: A | Discharge status: Home / Self Care | Payer: Self-pay | Attending: Emergency Medicine | Admitting: Emergency Medicine

## 2015-04-21 ENCOUNTER — Encounter: Payer: Self-pay | Admitting: Emergency Medicine

## 2015-04-21 DIAGNOSIS — E119 Type 2 diabetes mellitus without complications: Secondary | ICD-10-CM | POA: Insufficient documentation

## 2015-04-21 DIAGNOSIS — Z91040 Latex allergy status: Secondary | ICD-10-CM | POA: Insufficient documentation

## 2015-04-21 DIAGNOSIS — Z792 Long term (current) use of antibiotics: Secondary | ICD-10-CM | POA: Insufficient documentation

## 2015-04-21 DIAGNOSIS — R51 Headache: Secondary | ICD-10-CM | POA: Insufficient documentation

## 2015-04-21 DIAGNOSIS — Z79899 Other long term (current) drug therapy: Secondary | ICD-10-CM | POA: Insufficient documentation

## 2015-04-21 DIAGNOSIS — R519 Headache, unspecified: Secondary | ICD-10-CM

## 2015-04-21 DIAGNOSIS — F17210 Nicotine dependence, cigarettes, uncomplicated: Secondary | ICD-10-CM | POA: Insufficient documentation

## 2015-04-21 MED ORDER — METOCLOPRAMIDE HCL 5 MG PO TABS: 5.0000 mg | ORAL_TABLET | Freq: Three times a day (TID) | ORAL | Status: DC | PRN

## 2015-04-21 MED ORDER — CYCLOBENZAPRINE HCL 5 MG PO TABS: 5.0000 mg | ORAL_TABLET | Freq: Three times a day (TID) | ORAL | Status: DC | PRN

## 2015-04-21 MED ORDER — METOCLOPRAMIDE HCL 10 MG PO TABS
10.0000 mg | ORAL_TABLET | Freq: Once | ORAL | Status: AC
  Administered 2015-04-21: 10 mg via ORAL
  Filled 2015-04-21: qty 1

## 2015-04-21 MED ORDER — SUMATRIPTAN SUCCINATE 6 MG/0.5ML ~~LOC~~ SOLN
6.0000 mg | Freq: Once | SUBCUTANEOUS | Status: AC
  Administered 2015-04-21: 6 mg via SUBCUTANEOUS
  Filled 2015-04-21: qty 0.5

## 2015-04-21 MED ORDER — KETOROLAC TROMETHAMINE 60 MG/2ML IM SOLN
60.0000 mg | Freq: Once | INTRAMUSCULAR | Status: AC
  Administered 2015-04-21: 60 mg via INTRAMUSCULAR
  Filled 2015-04-21: qty 2

## 2015-04-21 MED ORDER — KETOROLAC TROMETHAMINE 10 MG PO TABS: 10.0000 mg | ORAL_TABLET | Freq: Three times a day (TID) | ORAL | Status: DC

## 2015-04-21 NOTE — Discharge Instructions (Signed)
General Headache Without Cause A headache is pain or discomfort felt around the head or neck area. There are many causes and types of headaches. In some cases, the cause may not be found.  HOME CARE  Managing Pain  Take over-the-counter and prescription medicines only as told by your doctor.  Lie down in a dark, quiet room when you have a headache.  If directed, apply ice to the head and neck area:  Put ice in a plastic bag.  Place a towel between your skin and the bag.  Leave the ice on for 20 minutes, 2-3 times per day.  Use a heating pad or hot shower to apply heat to the head and neck area as told by your doctor.  Keep lights dim if bright lights bother you or make your headaches worse. Eating and Drinking  Eat meals on a regular schedule.  Lessen how much alcohol you drink.  Lessen how much caffeine you drink, or stop drinking caffeine. General Instructions  Keep all follow-up visits as told by your doctor. This is important.  Keep a journal to find out if certain things bring on headaches. For example, write down:  What you eat and drink.  How much sleep you get.  Any change to your diet or medicines.  Relax by getting a massage or doing other relaxing activities.  Lessen stress.  Sit up straight. Do not tighten (tense) your muscles.  Do not use tobacco products. This includes cigarettes, chewing tobacco, or e-cigarettes. If you need help quitting, ask your doctor.  Exercise regularly as told by your doctor.  Get enough sleep. This often means 7-9 hours of sleep. GET HELP IF:  Your symptoms are not helped by medicine.  You have a headache that feels different than the other headaches.  You feel sick to your stomach (nauseous) or you throw up (vomit).  You have a fever. GET HELP RIGHT AWAY IF:   Your headache becomes really bad.  You keep throwing up.  You have a stiff neck.  You have trouble seeing.  You have trouble speaking.  You have  pain in the eye or ear.  Your muscles are weak or you lose muscle control.  You lose your balance or have trouble walking.  You feel like you will pass out (faint) or you pass out.  You have confusion.   This information is not intended to replace advice given to you by your health care provider. Make sure you discuss any questions you have with your health care provider.   Document Released: 12/29/2007 Document Revised: 12/10/2014 Document Reviewed: 07/14/2014 Elsevier Interactive Patient Education [employer]  Your exam is essentially normal today. Take the prescription medicines as directed for headache pain relief. Follow-up with a provider at [facility] for ongoing symptoms.

## 2015-04-21 NOTE — ED Notes (Addendum)
Pt presents to ED with headache for about a week and cold like symptoms (for a couple of months). Pt has been taking otc medications at home with no relief. Pt states her symptoms would return within an hour. Pt states a little over an hour ago she took Toradol and when it returned it seemed to hurt worse and she became tearful. Hx of the same. Last time she came to ED with the same type of headache her husband states she received a shot of dilaudid and then was able to sleep for a few hours and then she felt much better. Pt tearful during triage. nausea but denies vomiting.

## 2015-04-21 NOTE — ED Provider Notes (Signed)
[facility] Emergency Department Provider Note ____________________________________________  Time seen: 2245  I have reviewed the triage vital signs and the nursing notes.  HISTORY  Chief Complaint  Headache  HPI Brittany Bates is a 40 y.o. female this is the ED for evaluation of headache for the last week. She gives a history of migraines diagnosed 3 years prior and claims that she only takes Excedrin for migraine pain. She describes his headache is not being a migraine, because it did not respond to her dose of Excedrin the way her migraines typically do. She describes the pain to the left temple that radiates across to the right temple. She denies any nausea, vomiting, dizziness, and vision change, or weakness. She claims she is also dosed Tylenol for this headache without significant benefit. She rates her pain an 8/10 in triage.  Past Medical History  Diagnosis Date  . Diabetes mellitus without complication (HCC)   . Bronchitis     There are no active problems to display for this patient.   History reviewed. No pertinent past surgical history.  Current Outpatient Rx  Name  Route  Sig  Dispense  Refill  . cyclobenzaprine (FLEXERIL) 5 MG tablet   Oral   Take 1 tablet (5 mg total) by mouth 3 (three) times daily as needed for muscle spasms.   15 tablet   0   . ketorolac (TORADOL) 10 MG tablet   Oral   Take 1 tablet (10 mg total) by mouth every 8 (eight) hours.   15 tablet   0   . loratadine (CLARITIN) 10 MG tablet   Oral   Take 10 mg by mouth daily.         . metoCLOPramide (REGLAN) 5 MG tablet   Oral   Take 1 tablet (5 mg total) by mouth every 8 (eight) hours as needed for nausea or vomiting.   15 tablet   0   . penicillin v potassium (VEETID) 500 MG tablet   Oral   Take 1 tablet (500 mg total) by mouth 4 (four) times daily.   40 tablet   0   . traMADol (ULTRAM) 50 MG tablet   Oral   Take 1 tablet (50 mg total) by mouth 2 (two)  times daily.   10 tablet   0    Allergies Latex and Morphine and related  No family history on file.  Social History Social History  Substance Use Topics  . Smoking status: Current Every Day Smoker -- 0.50 packs/day    Types: Cigarettes  . Smokeless tobacco: None  . Alcohol Use: No   Review of Systems  Constitutional: Negative for fever. Eyes: Negative for visual changes. ENT: Negative for sore throat. Cardiovascular: Negative for chest pain. Respiratory: Negative for shortness of breath. Gastrointestinal: Negative for abdominal pain, vomiting and diarrhea. Genitourinary: Negative for dysuria. Musculoskeletal: Negative for back pain. Skin: Negative for rash. Neurological: Positive for headaches. Denies focal weakness or numbness. ____________________________________________  PHYSICAL EXAM:  VITAL SIGNS: ED Triage Vitals  Enc Vitals Group     BP 04/21/15 2111 137/82 mmHg     Pulse Rate 04/21/15 2111 97     Resp 04/21/15 2111 18     Temp 04/21/15 2111 97.9 F (36.6 C)     Temp Source 04/21/15 2111 Oral     SpO2 04/21/15 2111 99 %     Weight 04/21/15 2111 175 lb (79.379 kg)     Height 04/21/15 2111  (1.676  m)     Head Cir --      Peak Flow --      Pain Score 04/21/15 2206 9     Pain Loc --      Pain Edu? --      Excl. in GC? --    Constitutional: Alert and oriented. Well appearing and in no distress. Head: Normocephalic and atraumatic.      Eyes: Conjunctivae are normal. PERRL. Normal extraocular movements      Ears: Canals clear. TMs intact bilaterally.   Nose: No congestion/rhinorrhea.   Mouth/Throat: Mucous membranes are moist.   Neck: Supple. No thyromegaly. Hematological/Lymphatic/Immunological: No cervical lymphadenopathy. Cardiovascular: Normal rate, regular rhythm.  Respiratory: Normal respiratory effort. No wheezes/rales/rhonchi. Gastrointestinal: Soft and nontender. No distention. Musculoskeletal: Nontender with normal range of  motion in all extremities.  Neurologic: Cranial Nerves II through XII grossly intact. Normal UE DTRs bilaterally. Normal intrinsic and opposition testing.  Normal gait without ataxia. Normal speech and language. No gross focal neurologic deficits are appreciated. Skin:  Skin is warm, dry and intact. No rash noted. Psychiatric: Mood and affect are normal. Patient exhibits appropriate insight and judgment. ____________________________________________  PROCEDURES  Toradol 60 mg IM Sumatriptan 6 mg SQ Reglan 5 mg PO ____________________________________________  INITIAL IMPRESSION / ASSESSMENT AND PLAN / ED COURSE  Patient with an acute headache which does not appear to be her typical migraine presentation. She however is found to have a normal neuro exam without deficit. She'll be discharged with prescriptions for Flexeril, Toradol, and Reglan. She will follow with primary care provider one the local community clinics as needed. ____________________________________________  FINAL CLINICAL IMPRESSION(S) / ED DIAGNOSES  Final diagnoses:  Acute nonintractable headache, unspecified headache type      Lissa Hoard, PA-C 04/21/15 2340  Darien Ramus, MD 04/21/15 2348

## 2015-04-24 ENCOUNTER — Emergency Department
Admission: EM | Admit: 2015-04-24 | Discharge: 2015-04-24 | Disposition: A | Discharge status: Home / Self Care | Payer: Self-pay | Attending: Emergency Medicine | Admitting: Emergency Medicine

## 2015-04-24 ENCOUNTER — Encounter: Payer: Self-pay | Admitting: Emergency Medicine

## 2015-04-24 DIAGNOSIS — F17210 Nicotine dependence, cigarettes, uncomplicated: Secondary | ICD-10-CM | POA: Insufficient documentation

## 2015-04-24 DIAGNOSIS — Z79899 Other long term (current) drug therapy: Secondary | ICD-10-CM | POA: Insufficient documentation

## 2015-04-24 DIAGNOSIS — K0889 Other specified disorders of teeth and supporting structures: Secondary | ICD-10-CM | POA: Insufficient documentation

## 2015-04-24 DIAGNOSIS — Z791 Long term (current) use of non-steroidal anti-inflammatories (NSAID): Secondary | ICD-10-CM | POA: Insufficient documentation

## 2015-04-24 DIAGNOSIS — K029 Dental caries, unspecified: Secondary | ICD-10-CM

## 2015-04-24 DIAGNOSIS — K047 Periapical abscess without sinus: Secondary | ICD-10-CM | POA: Insufficient documentation

## 2015-04-24 DIAGNOSIS — E119 Type 2 diabetes mellitus without complications: Secondary | ICD-10-CM | POA: Insufficient documentation

## 2015-04-24 MED ORDER — PENICILLIN V POTASSIUM 500 MG PO TABS
500.0000 mg | ORAL_TABLET | Freq: Four times a day (QID) | ORAL | Status: DC
  Administered 2015-04-24: 500 mg via ORAL
  Filled 2015-04-24: qty 1

## 2015-04-24 MED ORDER — PENICILLIN V POTASSIUM 500 MG PO TABS: 500.0000 mg | ORAL_TABLET | Freq: Four times a day (QID) | ORAL | Status: DC

## 2015-04-24 NOTE — ED Notes (Addendum)
States she may have broken a tooth to left side  Increased pain with some facial swelling  Was seen for headache earlier this week ..but thinks pain is coming from her teeth

## 2015-04-24 NOTE — Discharge Instructions (Signed)
Dental Caries °Dental caries is tooth decay. This decay can cause a hole in teeth (cavity) that can get bigger and deeper over time. °HOME CARE °· Brush and floss your teeth. Do this at least two times a day. °· Use a fluoride toothpaste. °· Use a mouth rinse if told by your dentist or doctor. °· Eat less sugary and starchy foods. Drink less sugary drinks. °· Avoid snacking often on sugary and starchy foods. Avoid sipping often on sugary drinks. °· Keep regular checkups and cleanings with your dentist. °· Use fluoride supplements if told by your dentist or doctor. °· Allow fluoride to be applied to teeth if told by your dentist or doctor. °  °This information is not intended to replace advice given to you by your health care provider. Make sure you discuss any questions you have with your health care provider. °  °Document Released: 12/29/2007 Document Revised: 04/11/2014 Document Reviewed: 03/23/2012 °Elsevier Interactive Patient Education ©2016 [employer] ° °Dental Pain °Dental pain may be caused by many things, including: °· Tooth decay (cavities or caries). Cavities expose the nerve of your tooth to air and hot or cold temperatures. This can cause pain or discomfort. °· Abscess or infection. A dental abscess is a collection of infected pus from a bacterial infection in the inner part of the tooth (pulp). It usually occurs at the end of the tooth's root. °· Injury. °· An unknown reason (idiopathic). °Your pain may be mild or severe. It may only occur when: °· You are chewing. °· You are exposed to hot or cold temperature. °· You are eating or drinking sugary foods or beverages, such as soda or candy. °Your pain may also be constant. °HOME CARE INSTRUCTIONS °Watch your dental pain for any changes. The following actions may help to lessen any discomfort that you are feeling: °· Take medicines only as directed by your dentist. °· If you were prescribed an antibiotic medicine, finish all of it even if you start to  feel better. °· Keep all follow-up visits as directed by your dentist. This is important. °· Do not apply heat to the outside of your face. °· Rinse your mouth or gargle with salt water if directed by your dentist. This helps with pain and swelling. °¨ You can make salt water by adding ¼ tsp of salt to 1 cup of warm water. °· Apply ice to the painful area of your face: °¨ Put ice in a plastic bag. °¨ Place a towel between your skin and the bag. °¨ Leave the ice on for 20 minutes, 2-3 times per day. °· Avoid foods or drinks that cause you pain, such as: °¨ Very hot or very cold foods or drinks. °¨ Sweet or sugary foods or drinks. °SEEK MEDICAL CARE IF: °· Your pain is not controlled with medicines. °· Your symptoms are worse. °· You have new symptoms. °SEEK IMMEDIATE MEDICAL CARE IF: °· You are unable to open your mouth. °· You are having trouble breathing or swallowing. °· You have a fever. °· Your face, neck, or jaw is swollen. °  °This information is not intended to replace advice given to you by your health care provider. Make sure you discuss any questions you have with your health care provider. °  °Document Released: 03/21/2005 Document Revised: 08/05/2014 Document Reviewed: 03/17/2014 °Elsevier Interactive Patient Education ©2016 [employer] ° °

## 2015-04-24 NOTE — ED Provider Notes (Signed)
CSN: [id number]     Arrival date & time 04/24/15  1806 History   None    Chief Complaint  Patient presents with  . Dental Pain     (Consider location/radiation/quality/duration/timing/severity/associated sxs/prior Treatment) HPI  40 year old female presents to urgent part for evaluation of left-sided facial pain. She describes the pain as 8 out of 10 constant ache with throbbing sensation that has been present for the last couple of weeks. Patient has severe dental decay. She has frequent dental infections. Swelling has fluctuated over the last few days. She denies any fevers or difficulty swallowing. No chest pain or shortness of breath. She has been taken Toradol for pain.   Past Medical History  Diagnosis Date  . Diabetes mellitus without complication (HCC)   . Bronchitis    History reviewed. No pertinent past surgical history. No family history on file. Social History  Substance Use Topics  . Smoking status: Current Every Day Smoker -- 0.50 packs/day    Types: Cigarettes  . Smokeless tobacco: None  . Alcohol Use: No   OB History    No data available     Review of Systems  Constitutional: Negative.  Negative for fever and chills.  HENT: Positive for dental problem and facial swelling. Negative for drooling, mouth sores, trouble swallowing and voice change.   Respiratory: Negative for chest tightness and shortness of breath.   Cardiovascular: Negative for chest pain.  Gastrointestinal: Negative for nausea, vomiting, abdominal pain and diarrhea.  Musculoskeletal: Negative for arthralgias, neck pain and neck stiffness.  Skin: Negative.   Psychiatric/Behavioral: Negative for confusion.  All other systems reviewed and are negative.     Allergies  Latex and Morphine and related  Home Medications   Prior to Admission medications   Medication Sig Start Date End Date Taking? Authorizing Provider  cyclobenzaprine (FLEXERIL) 5 MG tablet Take 1 tablet (5 mg total) by mouth  3 (three) times daily as needed for muscle spasms. 04/21/15   Jenise V Bacon Menshew, PA-C  ketorolac (TORADOL) 10 MG tablet Take 1 tablet (10 mg total) by mouth every 8 (eight) hours. 04/21/15   Jenise V Bacon Menshew, PA-C  loratadine (CLARITIN) 10 MG tablet Take 10 mg by mouth daily.    Historical Provider, MD  metoCLOPramide (REGLAN) 5 MG tablet Take 1 tablet (5 mg total) by mouth every 8 (eight) hours as needed for nausea or vomiting. 04/21/15   Jenise V Bacon Menshew, PA-C  penicillin v potassium (VEETID) 500 MG tablet Take 1 tablet (500 mg total) by mouth 4 (four) times daily. 04/03/15   Jenise V Bacon Menshew, PA-C  penicillin v potassium (VEETID) 500 MG tablet Take 1 tablet (500 mg total) by mouth 4 (four) times daily. 04/24/15   Evon Slack, PA-C  traMADol (ULTRAM) 50 MG tablet Take 1 tablet (50 mg total) by mouth 2 (two) times daily. 04/03/15   Jenise V Bacon Menshew, PA-C   BP 139/85 mmHg  Pulse 97  Temp(Src) 97.9 F (36.6 C) (Oral)  Resp 20  Ht  (1.676 m)  Wt 79.379 kg  BMI 28.26 kg/m2  SpO2 97%  LMP 03/30/2015 (Approximate) Physical Exam  Constitutional: She is oriented to person, place, and time. She appears well-developed and well-nourished. No distress.  HENT:  Head: Normocephalic and atraumatic.  Right Ear: External ear normal.  Left Ear: External ear normal.  Nose: Nose normal.  Mouth/Throat: Uvula is midline and oropharynx is clear and moist. No oral lesions. No trismus in the  jaw. Normal dentition. Dental abscesses and dental caries present. No uvula swelling.    Eyes: EOM are normal. Pupils are equal, round, and reactive to light. Right eye exhibits no discharge. Left eye exhibits no discharge.  Neck: Normal range of motion. Neck supple.  Cardiovascular: Normal rate, regular rhythm and intact distal pulses.  Exam reveals no gallop and no friction rub.   No murmur heard. Pulmonary/Chest: Effort normal. No respiratory distress.  Abdominal: Soft.    Musculoskeletal: Normal range of motion. She exhibits no edema.  Neurological: She is alert and oriented to person, place, and time. She has normal reflexes.  Skin: Skin is warm and dry.  Psychiatric: She has a normal mood and affect. Her behavior is normal. Thought content normal.    ED Course  Procedures (including critical care time) Labs Review Labs Reviewed - No data to display  Imaging Review No results found. I have personally reviewed and evaluated these images and lab results as part of my medical decision-making.   EKG Interpretation None      MDM   Final diagnoses:  Pain due to dental caries    40 year old female with dental pain/infection. She is placed on penicillin VK for 10 days. She'll continue with Toradol. Follow-up with [facility] dental clinic in 3-4 days. Turn to the ER for any worsening symptoms urgent changes in her health.    Evon Slack, PA-C 04/24/15 1836  Governor Rooks, MD 04/24/15 2042

## 2015-06-08 DIAGNOSIS — F17210 Nicotine dependence, cigarettes, uncomplicated: Secondary | ICD-10-CM | POA: Insufficient documentation

## 2015-06-08 DIAGNOSIS — R0789 Other chest pain: Secondary | ICD-10-CM | POA: Insufficient documentation

## 2015-06-08 DIAGNOSIS — R0981 Nasal congestion: Secondary | ICD-10-CM | POA: Insufficient documentation

## 2015-06-08 DIAGNOSIS — E119 Type 2 diabetes mellitus without complications: Secondary | ICD-10-CM | POA: Insufficient documentation

## 2015-06-08 DIAGNOSIS — Z79899 Other long term (current) drug therapy: Secondary | ICD-10-CM | POA: Insufficient documentation

## 2015-06-08 DIAGNOSIS — R51 Headache: Secondary | ICD-10-CM | POA: Insufficient documentation

## 2015-06-08 DIAGNOSIS — Z91040 Latex allergy status: Secondary | ICD-10-CM | POA: Insufficient documentation

## 2015-06-08 DIAGNOSIS — Z792 Long term (current) use of antibiotics: Secondary | ICD-10-CM | POA: Insufficient documentation

## 2015-06-09 ENCOUNTER — Encounter: Payer: Self-pay | Admitting: Emergency Medicine

## 2015-06-09 ENCOUNTER — Emergency Department
Admission: EM | Admit: 2015-06-09 | Discharge: 2015-06-09 | Disposition: A | Discharge status: Home / Self Care | Payer: Self-pay | Attending: Emergency Medicine | Admitting: Emergency Medicine

## 2015-06-09 ENCOUNTER — Emergency Department: Payer: Self-pay

## 2015-06-09 DIAGNOSIS — R0981 Nasal congestion: Secondary | ICD-10-CM

## 2015-06-09 DIAGNOSIS — R519 Headache, unspecified: Secondary | ICD-10-CM

## 2015-06-09 DIAGNOSIS — R0789 Other chest pain: Secondary | ICD-10-CM

## 2015-06-09 DIAGNOSIS — R51 Headache: Secondary | ICD-10-CM

## 2015-06-09 MED ORDER — BUTALBITAL-APAP-CAFFEINE 50-325-40 MG PO TABS
2.0000 | ORAL_TABLET | Freq: Once | ORAL | Status: AC
  Administered 2015-06-09: 2 via ORAL
  Filled 2015-06-09: qty 2

## 2015-06-09 MED ORDER — CYCLOBENZAPRINE HCL 10 MG PO TABS: 10.0000 mg | ORAL_TABLET | Freq: Three times a day (TID) | ORAL | Status: DC | PRN

## 2015-06-09 MED ORDER — BUTALBITAL-APAP-CAFFEINE 50-325-40 MG PO TABS: 1.0000 | ORAL_TABLET | Freq: Three times a day (TID) | ORAL | Status: DC | PRN

## 2015-06-09 MED ORDER — OXYMETAZOLINE HCL 0.05 % NA SOLN
1.0000 | Freq: Once | NASAL | Status: AC
  Administered 2015-06-09: 1 via NASAL
  Filled 2015-06-09 (×2): qty 15

## 2015-06-09 MED ORDER — KETOROLAC TROMETHAMINE 60 MG/2ML IM SOLN
60.0000 mg | Freq: Once | INTRAMUSCULAR | Status: AC
  Administered 2015-06-09: 60 mg via INTRAMUSCULAR
  Filled 2015-06-09: qty 2

## 2015-06-09 NOTE — Discharge Instructions (Signed)
Chest Wall Pain °Chest wall pain is pain in or around the bones and muscles of your chest. Sometimes, an injury causes this pain. Sometimes, the cause may not be known. This pain may take several weeks or longer to get better. °HOME CARE INSTRUCTIONS  °Pay attention to any changes in your symptoms. Take these actions to help with your pain:  °· Rest as told by your health care provider.   °· Avoid activities that cause pain. These include any activities that use your chest muscles or your abdominal and side muscles to lift heavy items.    °· If directed, apply ice to the painful area: °· Put ice in a plastic bag. °· Place a towel between your skin and the bag. °· Leave the ice on for 20 minutes, 2-3 times per day. °· Take over-the-counter and prescription medicines only as told by your health care provider. °· Do not use tobacco products, including cigarettes, chewing tobacco, and e-cigarettes. If you need help quitting, ask your health care provider. °· Keep all follow-up visits as told by your health care provider. This is important. °SEEK MEDICAL CARE IF: °· You have a fever. °· Your chest pain becomes worse. °· You have new symptoms. °SEEK IMMEDIATE MEDICAL CARE IF: °· You have nausea or vomiting. °· You feel sweaty or light-headed. °· You have a cough with phlegm (sputum) or you cough up blood. °· You develop shortness of breath. °  °This information is not intended to replace advice given to you by your health care provider. Make sure you discuss any questions you have with your health care provider. °  °Document Released: 03/21/2005 Document Revised: 12/10/2014 Document Reviewed: 06/16/2014 °Elsevier Interactive Patient Education ©2016 [employer] °General Headache Without Cause °A headache is pain or discomfort felt around the head or neck area. The specific cause of a headache may not be found. There are many causes and types of headaches. A few common ones are: °· Tension headaches. °· Migraine  headaches. °· Cluster headaches. °· Chronic daily headaches. °HOME CARE INSTRUCTIONS  °Watch your condition for any changes. Take these steps to help with your condition: °Managing Pain °· Take over-the-counter and prescription medicines only as told by your health care provider. °· Lie down in a dark, quiet room when you have a headache. °· If directed, apply ice to the head and neck area: °¨ Put ice in a plastic bag. °¨ Place a towel between your skin and the bag. °¨ Leave the ice on for 20 minutes, 2-3 times per day. °· Use a heating pad or hot shower to apply heat to the head and neck area as told by your health care provider. °· Keep lights dim if bright lights bother you or make your headaches worse. °Eating and Drinking °· Eat meals on a regular schedule. °· Limit alcohol use. °· Decrease the amount of caffeine you drink, or stop drinking caffeine. °General Instructions °· Keep all follow-up visits as told by your health care provider. This is important. °· Keep a headache journal to help find out what may trigger your headaches. For example, write down: °¨ What you eat and drink. °¨ How much sleep you get. °¨ Any change to your diet or medicines. °· Try massage or other relaxation techniques. °· Limit stress. °· Sit up straight, and do not tense your muscles. °· Do not use tobacco products, including cigarettes, chewing tobacco, or e-cigarettes. If you need help quitting, ask your health care provider. °· Exercise regularly as told   by your health care provider. °· Sleep on a regular schedule. Get 7-9 hours of sleep, or the amount recommended by your health care provider. °SEEK MEDICAL CARE IF:  °· Your symptoms are not helped by medicine. °· You have a headache that is different from the usual headache. °· You have nausea or you vomit. °· You have a fever. °SEEK IMMEDIATE MEDICAL CARE IF:  °· Your headache becomes severe. °· You have repeated vomiting. °· You have a stiff neck. °· You have a loss of  vision. °· You have problems with speech. °· You have pain in the eye or ear. °· You have muscular weakness or loss of muscle control. °· You lose your balance or have trouble walking. °· You feel faint or pass out. °· You have confusion. °  °This information is not intended to replace advice given to you by your health care provider. Make sure you discuss any questions you have with your health care provider. °  °Document Released: 03/21/2005 Document Revised: 12/10/2014 Document Reviewed: 07/14/2014 °Elsevier Interactive Patient Education ©2016 Elsevier Inc. ° °

## 2015-06-09 NOTE — ED Provider Notes (Signed)
[facility] Emergency Department Provider Note  ____________________________________________  Time seen: Approximately 504 AM  I have reviewed the triage vital signs and the nursing notes.   HISTORY  Chief Complaint Nasal Congestion; Fever; and Rib Injury    HPI Brittany Bates is a 40 y.o. female comes into the hospital today with congestion for the past month, headache for a week and some right-sided chest pain. She reports that she was putting her dog into a bathtub and when she stood up she had some pain in her right chest. She reports the dog weighs 60-70 pounds. She did not take anything for pain. She's been taking ibuprofen for her headache as well as Excedrin but it has not helped a lot. She reports that she's been using generic DayQuil for the congestion but it keeps coming back. The patient feels as though she can't smell anything she does have a history of seasonal allergies. The patient has been taking Claritin. The patient reports that she could not tolerate the symptoms and the pain anymore so she decided to come into the hospital tonight.She rates her pain an 8 out of 10 in intensity.   Past Medical History  Diagnosis Date  . Diabetes mellitus without complication (HCC)   . Bronchitis     There are no active problems to display for this patient.   History reviewed. No pertinent past surgical history.  Current Outpatient Rx  Name  Route  Sig  Dispense  Refill  . butalbital-acetaminophen-caffeine (FIORICET) 50-325-40 MG tablet   Oral   Take 1-2 tablets by mouth every 8 (eight) hours as needed for headache.   20 tablet   0   . cyclobenzaprine (FLEXERIL) 10 MG tablet   Oral   Take 1 tablet (10 mg total) by mouth every 8 (eight) hours as needed for muscle spasms.   15 tablet   0   . cyclobenzaprine (FLEXERIL) 5 MG tablet   Oral   Take 1 tablet (5 mg total) by mouth 3 (three) times daily as needed for muscle spasms.   15 tablet   0    . ketorolac (TORADOL) 10 MG tablet   Oral   Take 1 tablet (10 mg total) by mouth every 8 (eight) hours.   15 tablet   0   . loratadine (CLARITIN) 10 MG tablet   Oral   Take 10 mg by mouth daily.         . metoCLOPramide (REGLAN) 5 MG tablet   Oral   Take 1 tablet (5 mg total) by mouth every 8 (eight) hours as needed for nausea or vomiting.   15 tablet   0   . penicillin v potassium (VEETID) 500 MG tablet   Oral   Take 1 tablet (500 mg total) by mouth 4 (four) times daily.   40 tablet   0   . penicillin v potassium (VEETID) 500 MG tablet   Oral   Take 1 tablet (500 mg total) by mouth 4 (four) times daily.   40 tablet   0   . traMADol (ULTRAM) 50 MG tablet   Oral   Take 1 tablet (50 mg total) by mouth 2 (two) times daily.   10 tablet   0     Allergies Latex and Morphine and related  No family history on file.  Social History Social History  Substance Use Topics  . Smoking status: Current Every Day Smoker -- 0.50 packs/day    Types: Cigarettes  .  Smokeless tobacco: None  . Alcohol Use: No    Review of Systems Constitutional: No fever/chills Eyes: No visual changes. ENT: Nasal congestion Cardiovascular: Right-sided chest pain Respiratory: Denies shortness of breath. Gastrointestinal: No abdominal pain.  No nausea, no vomiting.  No diarrhea.  No constipation. Genitourinary: Negative for dysuria. Musculoskeletal: Negative for back pain. Skin: Negative for rash. Neurological: Headache  10-point ROS otherwise negative.  ____________________________________________   PHYSICAL EXAM:  VITAL SIGNS: ED Triage Vitals  Enc Vitals Group     BP 06/09/15 0026 125/84 mmHg     Pulse Rate 06/09/15 0026 97     Resp 06/09/15 0026 20     Temp 06/09/15 0026 98.4 F (36.9 C)     Temp Source 06/09/15 0026 Oral     SpO2 06/09/15 0026 98 %     Weight 06/09/15 0026 175 lb (79.379 kg)     Height 06/09/15 0026 5\' 6"  (1.676 m)     Head Cir --      Peak Flow --       Pain Score 06/09/15 0026 8     Pain Loc --      Pain Edu? --      Excl. in GC? --     Constitutional: Alert and oriented. Well appearing and in mild distress. Eyes: Conjunctivae are normal. PERRL. EOMI. Head: Atraumatic. Nose: No congestion/rhinnorhea. Mouth/Throat: Mucous membranes are moist.  Oropharynx non-erythematous. Cardiovascular: Normal rate, regular rhythm. Grossly normal heart sounds.  Good peripheral circulation. Respiratory: Normal respiratory effort.  No retractions. Lungs CTAB. Right chest wall tender palpation Gastrointestinal: Soft and nontender. No distention. Positive bowel sounds Musculoskeletal: No lower extremity tenderness nor edema.   Neurologic:  Normal speech and language. No gross focal neurologic deficits are appreciated. No gait instability. Skin:  Skin is warm, dry and intact. Psychiatric: Mood and affect are normal.   ____________________________________________   LABS (all labs ordered are listed, but only abnormal results are displayed)  Labs Reviewed - No data to display ____________________________________________  EKG  None ____________________________________________  RADIOLOGY  Chest x-ray: Mild central bronchial thickening, this may reflect bronchitis or asthma. ____________________________________________   PROCEDURES  Procedure(s) performed: None  Critical Care performed: No  ____________________________________________   INITIAL IMPRESSION / ASSESSMENT AND PLAN / ED COURSE  Pertinent labs & imaging results that were available during my care of the patient were reviewed by me and considered in my medical decision making (see chart for details).  This is a 40 year old female who comes into the hospital today with some chest discomfort, headache and some nasal congestion. I did write the patient for some Afrin as well as a dose of Fioricet. The patient will also receive a shot of Toradol for her chest wall pain. I did have  the patient monitor while in the emergency department and when I did go in to reassess her she was sleeping comfortably. Patient reports that she was unable to sleep previously. The patient's significant other reports that she just had fallen asleep. She will be discharged home to follow-up with the acute care clinic. The patient has no further complaints at this time. ____________________________________________   FINAL CLINICAL IMPRESSION(S) / ED DIAGNOSES  Final diagnoses:  Acute nonintractable headache, unspecified headache type  Nasal congestion  Chest wall pain      Rebecka ApleyAllison P Webster, MD 06/09/15 [phone]

## 2015-06-09 NOTE — ED Notes (Addendum)
Patient ambulatory to triage with steady gait, without difficulty or distress noted; pt reports nasal congestion x month with right sided HA; also reports right lower rib pain after picking dog up in tub; denies SOB

## 2015-06-13 ENCOUNTER — Emergency Department
Admission: EM | Admit: 2015-06-13 | Discharge: 2015-06-13 | Disposition: A | Discharge status: Home / Self Care | Payer: Self-pay | Attending: Emergency Medicine | Admitting: Emergency Medicine

## 2015-06-13 ENCOUNTER — Encounter: Payer: Self-pay | Admitting: Emergency Medicine

## 2015-06-13 DIAGNOSIS — Z79899 Other long term (current) drug therapy: Secondary | ICD-10-CM | POA: Insufficient documentation

## 2015-06-13 DIAGNOSIS — K029 Dental caries, unspecified: Secondary | ICD-10-CM | POA: Insufficient documentation

## 2015-06-13 DIAGNOSIS — F17210 Nicotine dependence, cigarettes, uncomplicated: Secondary | ICD-10-CM | POA: Insufficient documentation

## 2015-06-13 DIAGNOSIS — Z91040 Latex allergy status: Secondary | ICD-10-CM | POA: Insufficient documentation

## 2015-06-13 DIAGNOSIS — E119 Type 2 diabetes mellitus without complications: Secondary | ICD-10-CM | POA: Insufficient documentation

## 2015-06-13 DIAGNOSIS — Z791 Long term (current) use of non-steroidal anti-inflammatories (NSAID): Secondary | ICD-10-CM | POA: Insufficient documentation

## 2015-06-13 DIAGNOSIS — K047 Periapical abscess without sinus: Secondary | ICD-10-CM

## 2015-06-13 DIAGNOSIS — K0889 Other specified disorders of teeth and supporting structures: Secondary | ICD-10-CM | POA: Insufficient documentation

## 2015-06-13 MED ORDER — TRAMADOL HCL 50 MG PO TABS: 50.0000 mg | ORAL_TABLET | Freq: Four times a day (QID) | ORAL | Status: DC | PRN

## 2015-06-13 MED ORDER — PENICILLIN V POTASSIUM 500 MG PO TABS: 500.0000 mg | ORAL_TABLET | Freq: Four times a day (QID) | ORAL | Status: DC

## 2015-06-13 NOTE — Discharge Instructions (Signed)
Dental Care and Dentist Visits °Dental care supports good overall health. Regular dental visits can also help you avoid dental pain, bleeding, infection, and other more serious health problems in the future. It is important to keep the mouth healthy because diseases in the teeth, gums, and other oral tissues can spread to other areas of the body. Some problems, such as diabetes, heart disease, and pre-term labor have been associated with poor oral health.  °See your dentist every 6 months. If you experience emergency problems such as a toothache or broken tooth, go to the dentist right away. If you see your dentist regularly, you may catch problems early. It is easier to be treated for problems in the early stages.  °WHAT TO EXPECT AT A DENTIST VISIT  °Your dentist will look for many common oral health problems and recommend proper treatment. At your regular dental visit, you can expect: °· Gentle cleaning of the teeth and gums. This includes scraping and polishing. This helps to remove the sticky substance around the teeth and gums (plaque). Plaque forms in the mouth shortly after eating. Over time, plaque hardens on the teeth as tartar. If tartar is not removed regularly, it can cause problems. Cleaning also helps remove stains. °· Periodic X-rays. These pictures of the teeth and supporting bone will help your dentist assess the health of your teeth. °· Periodic fluoride treatments. Fluoride is a natural mineral shown to help strengthen teeth. Fluoride treatment involves applying a fluoride gel or varnish to the teeth. It is most commonly done in children. °· Examination of the mouth, tongue, jaws, teeth, and gums to look for any oral health problems, such as: °· Cavities (dental caries). This is decay on the tooth caused by plaque, sugar, and acid in the mouth. It is best to catch a cavity when it is small. °· Inflammation of the gums caused by plaque buildup (gingivitis). °· Problems with the mouth or malformed  or misaligned teeth. °· Oral cancer or other diseases of the soft tissues or jaws.  °KEEP YOUR TEETH AND GUMS HEALTHY °For healthy teeth and gums, follow these general guidelines as well as your dentist's specific advice: °· Have your teeth professionally cleaned at the dentist every 6 months. °· Brush twice daily with a fluoride toothpaste. °· Floss your teeth daily.  °· Ask your dentist if you need fluoride supplements, treatments, or fluoride toothpaste. °· Eat a healthy diet. Reduce foods and drinks with added sugar. °· Avoid smoking. °TREATMENT FOR ORAL HEALTH PROBLEMS °If you have oral health problems, treatment varies depending on the conditions present in your teeth and gums. °· Your caregiver will most likely recommend good oral hygiene at each visit. °· For cavities, gingivitis, or other oral health disease, your caregiver will perform a procedure to treat the problem. This is typically done at a separate appointment. Sometimes your caregiver will refer you to another dental specialist for specific tooth problems or for surgery. °SEEK IMMEDIATE DENTAL CARE IF: °· You have pain, bleeding, or soreness in the gum, tooth, jaw, or mouth area. °· A permanent tooth becomes loose or separated from the gum socket. °· You experience a blow or injury to the mouth or jaw area. °  °This information is not intended to replace advice given to you by your health care provider. Make sure you discuss any questions you have with your health care provider. °  °Document Released: 12/01/2010 Document Revised: 06/13/2011 Document Reviewed: 12/01/2010 °Elsevier Interactive Patient Education ©2016 Elsevier Inc. ° °Dental Caries °Dental   caries (also called tooth decay) is the most common oral disease. It can occur at any age but is more common in children and young adults.  °HOW DENTAL CARIES DEVELOPS  °The process of decay begins when bacteria and foods (particularly sugars and starches) combine in your mouth to produce plaque.  Plaque is a substance that sticks to the hard, outer surface of a tooth (enamel). The bacteria in plaque produce acids that attack enamel. These acids may also attack the root surface of a tooth (cementum) if it is exposed. Repeated attacks dissolve these surfaces and create holes in the tooth (cavities). If left untreated, the acids destroy the other layers of the tooth.  °RISK FACTORS °· Frequent sipping of sugary beverages.   °· Frequent snacking on sugary and starchy foods, especially those that easily get stuck in the teeth.   °· Poor oral hygiene.   °· Dry mouth.   °· Substance abuse such as methamphetamine abuse.   °· Broken or poor-fitting dental restorations.   °· Eating disorders.   °· Gastroesophageal reflux disease (GERD).   °· Certain radiation treatments to the head and neck. °SYMPTOMS °In the early stages of dental caries, symptoms are seldom present. Sometimes white, chalky areas may be seen on the enamel or other tooth layers. In later stages, symptoms may include: °· Pits and holes on the enamel. °· Toothache after sweet, hot, or cold foods or drinks are consumed. °· Pain around the tooth. °· Swelling around the tooth. °DIAGNOSIS  °Most of the time, dental caries is detected during a regular dental checkup. A diagnosis is made after a thorough medical and dental history is taken and the surfaces of your teeth are checked for signs of dental caries. Sometimes special instruments, such as lasers, are used to check for dental caries. Dental X-ray exams may be taken so that areas not visible to the eye (such as between the contact areas of the teeth) can be checked for cavities.  °TREATMENT  °If dental caries is in its early stages, it may be reversed with a fluoride treatment or an application of a remineralizing agent at the dental office. Thorough brushing and flossing at home is needed to aid these treatments. If it is in its later stages, treatment depends on the location and extent of tooth  destruction:  °· If a small area of the tooth has been destroyed, the destroyed area will be removed and cavities will be filled with a material such as gold, silver amalgam, or composite resin.   °· If a large area of the tooth has been destroyed, the destroyed area will be removed and a cap (crown) will be fitted over the remaining tooth structure.   °· If the center part of the tooth (pulp) is affected, a procedure called a root canal will be needed before a filling or crown can be placed.   °· If most of the tooth has been destroyed, the tooth may need to be pulled (extracted). °HOME CARE INSTRUCTIONS °You can prevent, stop, or reverse dental caries at home by practicing good oral hygiene. Good oral hygiene includes: °· Thoroughly cleaning your teeth at least twice a day with a toothbrush and dental floss.   °· Using a fluoride toothpaste. A fluoride mouth rinse may also be used if recommended by your dentist or health care provider.   °· Restricting the amount of sugary and starchy foods and sugary liquids you consume.   °· Avoiding frequent snacking on these foods and sipping of these liquids.   °· Keeping regular visits with a dentist for   checkups and cleanings. °PREVENTION  °· Practice good oral hygiene. °· Consider a dental sealant. A dental sealant is a coating material that is applied by your dentist to the pits and grooves of teeth. The sealant prevents food from being trapped in them. It may protect the teeth for several years. °· Ask about fluoride supplements if you live in a community without fluorinated water or with water that has a low fluoride content. Use fluoride supplements as directed by your dentist or health care provider. °· Allow fluoride varnish applications to teeth if directed by your dentist or health care provider. °  °This information is not intended to replace advice given to you by your health care provider. Make sure you discuss any questions you have with your health care  provider. °  °Document Released: 12/11/2001 Document Revised: 04/11/2014 Document Reviewed: 03/23/2012 °Elsevier Interactive Patient Education ©2016 Elsevier Inc. ° °

## 2015-06-13 NOTE — ED Provider Notes (Signed)
CSN: [id number]     Arrival date & time 06/13/15  1216 History   First MD Initiated Contact with Patient 06/13/15 1312     Chief Complaint  Patient presents with  . Dental Pain     (Consider location/radiation/quality/duration/timing/severity/associated sxs/prior Treatment) HPI 40 year old female presents to the emergency department for evaluation of dental pain. She's had dental pain for 3 days. She describes swelling and discomfort along right lower back molar. No drainage, fevers, difficulty swallowing. Pain is moderate. No relief with ibuprofen. She has not been on any recent antibiotics. Has been trying to schedule appointment with Maurine Minister, has been unsuccessful.    Past Medical History  Diagnosis Date  . Diabetes mellitus without complication (HCC)   . Bronchitis    History reviewed. No pertinent past surgical history. No family history on file. Social History  Substance Use Topics  . Smoking status: Current Every Day Smoker -- 0.50 packs/day    Types: Cigarettes  . Smokeless tobacco: None  . Alcohol Use: No   OB History    No data available     Review of Systems  Constitutional: Negative.  Negative for fever, chills, activity change and fatigue.  HENT: Positive for dental problem and facial swelling. Negative for congestion, drooling, mouth sores, sinus pressure, sore throat, trouble swallowing and voice change.   Eyes: Negative for visual disturbance.  Respiratory: Negative for cough, chest tightness and shortness of breath.   Cardiovascular: Negative for chest pain and leg swelling.  Gastrointestinal: Negative for nausea, vomiting, abdominal pain and diarrhea.  Genitourinary: Negative for dysuria.  Musculoskeletal: Negative for arthralgias, gait problem, neck pain and neck stiffness.  Skin: Negative.  Negative for rash.  Neurological: Negative for weakness, numbness and headaches.  Hematological: Negative for adenopathy.  Psychiatric/Behavioral: Negative for  behavioral problems, confusion and agitation.  All other systems reviewed and are negative.     Allergies  Latex and Morphine and related  Home Medications   Prior to Admission medications   Medication Sig Start Date End Date Taking? Authorizing Provider  butalbital-acetaminophen-caffeine (FIORICET) 50-325-40 MG tablet Take 1-2 tablets by mouth every 8 (eight) hours as needed for headache. 06/09/15 06/08/16  Rebecka Apley, MD  cyclobenzaprine (FLEXERIL) 10 MG tablet Take 1 tablet (10 mg total) by mouth every 8 (eight) hours as needed for muscle spasms. 06/09/15   Rebecka Apley, MD  cyclobenzaprine (FLEXERIL) 5 MG tablet Take 1 tablet (5 mg total) by mouth 3 (three) times daily as needed for muscle spasms. 04/21/15   Jenise V Bacon Menshew, PA-C  ketorolac (TORADOL) 10 MG tablet Take 1 tablet (10 mg total) by mouth every 8 (eight) hours. 04/21/15   Jenise V Bacon Menshew, PA-C  loratadine (CLARITIN) 10 MG tablet Take 10 mg by mouth daily.    Historical Provider, MD  metoCLOPramide (REGLAN) 5 MG tablet Take 1 tablet (5 mg total) by mouth every 8 (eight) hours as needed for nausea or vomiting. 04/21/15   Jenise V Bacon Menshew, PA-C  penicillin v potassium (VEETID) 500 MG tablet Take 1 tablet (500 mg total) by mouth 4 (four) times daily. 06/13/15   Evon Slack, PA-C  traMADol (ULTRAM) 50 MG tablet Take 1 tablet (50 mg total) by mouth every 6 (six) hours as needed. 06/13/15   Evon Slack, PA-C   BP 131/84 mmHg  Pulse 94  Temp(Src) 97.4 F (36.3 C) (Oral)  Resp 18  Ht  (1.676 m)  Wt 79.379 kg  BMI 28.26 kg/m2  SpO2 100%  LMP 06/04/2015 (Exact Date) Physical Exam  Constitutional: She is oriented to person, place, and time. She appears well-developed and well-nourished. No distress.  HENT:  Head: Normocephalic and atraumatic.  Mouth/Throat: Oropharynx is clear and moist. No trismus in the jaw. No oropharyngeal exudate, posterior oropharyngeal edema or posterior oropharyngeal  erythema.    Eyes: EOM are normal. Pupils are equal, round, and reactive to light. Right eye exhibits no discharge. Left eye exhibits no discharge.  Neck: Normal range of motion. Neck supple.  Cardiovascular: Normal rate, regular rhythm and intact distal pulses.   Pulmonary/Chest: Effort normal and breath sounds normal. No respiratory distress. She exhibits no tenderness.  Abdominal: Soft. She exhibits no distension. There is no tenderness.  Musculoskeletal: Normal range of motion. She exhibits no edema.  Neurological: She is alert and oriented to person, place, and time. She has normal reflexes.  Skin: Skin is warm and dry.  Psychiatric: She has a normal mood and affect. Her behavior is normal. Thought content normal.    ED Course  Procedures (including critical care time) Labs Review Labs Reviewed - No data to display  Imaging Review No results found. I have personally reviewed and evaluated these images and lab results as part of my medical decision-making.   EKG Interpretation None      MDM   Final diagnoses:  Pain, dental  Infected dental carries    40 year old female with dental pain for 3 days. She has multiple dental caries and soft tissue swelling along the right lower jaw. She is started on penicillin VK one tab by mouth twice a day for 10 days. Tramadol as needed for pain. She is also given a prescription for ibuprofen. She will follow-up with dentist. Return to the ER for any worsening symptoms urgent changes in health.   Evon Slackhomas C Rumeal Cullipher, PA-C 06/13/15 1331  Jene Everyobert Kinner, MD 06/13/15 [phone]

## 2015-06-13 NOTE — ED Notes (Signed)
Toothache x 2 days 

## 2015-08-10 ENCOUNTER — Encounter: Payer: Self-pay | Admitting: Emergency Medicine

## 2015-08-10 ENCOUNTER — Emergency Department
Admission: EM | Admit: 2015-08-10 | Discharge: 2015-08-10 | Disposition: A | Discharge status: Home / Self Care | Payer: Self-pay | Attending: Emergency Medicine | Admitting: Emergency Medicine

## 2015-08-10 DIAGNOSIS — F17210 Nicotine dependence, cigarettes, uncomplicated: Secondary | ICD-10-CM | POA: Insufficient documentation

## 2015-08-10 DIAGNOSIS — Y999 Unspecified external cause status: Secondary | ICD-10-CM | POA: Insufficient documentation

## 2015-08-10 DIAGNOSIS — W57XXXA Bitten or stung by nonvenomous insect and other nonvenomous arthropods, initial encounter: Secondary | ICD-10-CM | POA: Insufficient documentation

## 2015-08-10 DIAGNOSIS — Z91040 Latex allergy status: Secondary | ICD-10-CM | POA: Insufficient documentation

## 2015-08-10 DIAGNOSIS — Z885 Allergy status to narcotic agent status: Secondary | ICD-10-CM | POA: Insufficient documentation

## 2015-08-10 DIAGNOSIS — E119 Type 2 diabetes mellitus without complications: Secondary | ICD-10-CM | POA: Insufficient documentation

## 2015-08-10 DIAGNOSIS — S80861A Insect bite (nonvenomous), right lower leg, initial encounter: Secondary | ICD-10-CM | POA: Insufficient documentation

## 2015-08-10 DIAGNOSIS — Y929 Unspecified place or not applicable: Secondary | ICD-10-CM | POA: Insufficient documentation

## 2015-08-10 DIAGNOSIS — Y939 Activity, unspecified: Secondary | ICD-10-CM | POA: Insufficient documentation

## 2015-08-10 DIAGNOSIS — L03115 Cellulitis of right lower limb: Secondary | ICD-10-CM | POA: Insufficient documentation

## 2015-08-10 DIAGNOSIS — Z79899 Other long term (current) drug therapy: Secondary | ICD-10-CM | POA: Insufficient documentation

## 2015-08-10 MED ORDER — SULFAMETHOXAZOLE-TRIMETHOPRIM 800-160 MG PO TABS: 1.0000 | ORAL_TABLET | Freq: Two times a day (BID) | ORAL | Status: DC

## 2015-08-10 MED ORDER — SULFAMETHOXAZOLE-TRIMETHOPRIM 800-160 MG PO TABS
1.0000 | ORAL_TABLET | Freq: Once | ORAL | Status: AC
  Administered 2015-08-10: 1 via ORAL
  Filled 2015-08-10: qty 1

## 2015-08-10 NOTE — ED Notes (Signed)
Pt noticed small red area on side of calf on Sat, now redness increasing, painful to touch, warm to touch. Appears to be a bug or spider bite, or ingrown hair per pt.

## 2015-08-10 NOTE — Discharge Instructions (Signed)
Insect Bite Mosquitoes, flies, fleas, bedbugs, and many other insects can bite. Insect bites are different from insect stings. A sting is when poison (venom) is injected into the skin. Insect bites can cause pain or itching for a few days, but they are usually not serious. Some insects can spread diseases to people through a bite. SYMPTOMS  Symptoms of an insect bite include:  Itching or pain in the bite area.  Redness and swelling in the bite area.  An open wound (skin ulcer). In many cases, symptoms last for 2-4 days.  DIAGNOSIS  This condition is usually diagnosed based on symptoms and a physical exam. TREATMENT  Treatment is usually not needed for an insect bite. Symptoms often go away on their own. Your health care provider may recommend creams or lotions to help reduce itching. Antibiotic medicines may be prescribed if the bite becomes infected. A tetanus shot may be given in some cases. If you develop an allergic reaction to an insect bite, your health care provider will prescribe medicines to treat the reaction (antihistamines). This is rare. HOME CARE INSTRUCTIONS  Do not scratch the bite area.  Keep the bite area clean and dry. Wash the bite area daily with soap and water as told by your health care provider.  If directed, applyice to the bite area.  Put ice in a plastic bag.  Place a towel between your skin and the bag.  Leave the ice on for 20 minutes, 2-3 times per day.  To help reduce itching and swelling, try applying a baking soda paste, cortisone cream, or calamine lotion to the bite area as told by your health care provider.  Apply or take over-the-counter and prescription medicines only as told by your health care provider.  If you were prescribed an antibiotic medicine, use it as told by your health care provider. Do not stop using the antibiotic even if your condition improves.  Keep all follow-up visits as told by your health care provider. This is  important. PREVENTION   Use insect repellent. The best insect repellents contain:  DEET, picaridin, oil of lemon eucalyptus (OLE), or IR3535.  Higher amounts of an active ingredient.  When you are outdoors, wear clothing that covers your arms and legs.  Avoid opening windows that do not have window screens. SEEK MEDICAL CARE IF:  You have increased redness, swelling, or pain in the bite area.  You have a fever. SEEK IMMEDIATE MEDICAL CARE IF:   You have joint pain.   You have fluid, blood, or pus coming from the bite area.  You have a headache or neck pain.  You have unusual weakness.  You have a rash.  You have chest pain or shortness of breath.  You have abdominal pain, nausea, or vomiting.  You feel unusually tired or sleepy.   This information is not intended to replace advice given to you by your health care provider. Make sure you discuss any questions you have with your health care provider.   Document Released: 04/28/2004 Document Revised: 12/10/2014 Document Reviewed: 08/06/2014 Elsevier Interactive Patient Education 2016 Elsevier Inc.  Cellulitis Cellulitis is an infection of the skin and the tissue under the skin. The infected area is usually red and tender. This happens most often in the arms and lower legs. HOME CARE   Take your antibiotic medicine as told. Finish the medicine even if you start to feel better.  Keep the infected arm or leg raised (elevated).  Put a warm cloth on  the area up to 4 times per day.  Only take medicines as told by your doctor.  Keep all doctor visits as told. GET HELP IF:  You see red streaks on the skin coming from the infected area.  Your red area gets bigger or turns a dark color.  Your bone or joint under the infected area is painful after the skin heals.  Your infection comes back in the same area or different area.  You have a puffy (swollen) bump in the infected area.  You have new symptoms.  You have  a fever. GET HELP RIGHT AWAY IF:   You feel very sleepy.  You throw up (vomit) or have watery poop (diarrhea).  You feel sick and have muscle aches and pains.   This information is not intended to replace advice given to you by your health care provider. Make sure you discuss any questions you have with your health care provider.   Document Released: 09/07/2007 Document Revised: 12/10/2014 Document Reviewed: 06/06/2011 Elsevier Interactive Patient Education 2016 [employer]   You likely have a local reaction to an insect bite. Take the antibiotic as directed until completely gone. Apply warm compresses and rest with the leg elevated. Follow-[facility] with your provider or [facility] as needed. Take Tylenol or Motrin for pain relief.

## 2015-08-10 NOTE — ED Notes (Signed)
States she fell an area to lower leg on sat  Now area is larger and having more pain  Possible insect bite

## 2015-08-13 NOTE — ED Provider Notes (Signed)
[facility] Emergency Department Provider Note ____________________________________________  Time seen: 1916  I have reviewed the triage vital signs and the nursing notes.  HISTORY  Chief Complaint  Leg Pain and Insect Bite  HPI Brittany Bates is a 40 y.o. female isn't to the ED for evaluation of a possible insect bite to herright lower leg.  She reports feeling something bite or sting her, but did not look for it. Since then, she's noted local redness, warmth, and firmness. She denies interim fevers, chills, sweats. She also denies any local wound drainage or streaking up the leg. She is not taking any medication or she is without any compresses to the leg for her benefit. She reports pain to the leg at a 9/10 in triage.  Past Medical History  Diagnosis Date  . Diabetes mellitus without complication (HCC)   . Bronchitis     There are no active problems to display for this patient.   History reviewed. No pertinent past surgical history.  Current Outpatient Rx  Name  Route  Sig  Dispense  Refill  . butalbital-acetaminophen-caffeine (FIORICET) 50-325-40 MG tablet   Oral   Take 1-2 tablets by mouth every 8 (eight) hours as needed for headache.   20 tablet   0   . cyclobenzaprine (FLEXERIL) 10 MG tablet   Oral   Take 1 tablet (10 mg total) by mouth every 8 (eight) hours as needed for muscle spasms.   15 tablet   0   . cyclobenzaprine (FLEXERIL) 5 MG tablet   Oral   Take 1 tablet (5 mg total) by mouth 3 (three) times daily as needed for muscle spasms.   15 tablet   0   . ketorolac (TORADOL) 10 MG tablet   Oral   Take 1 tablet (10 mg total) by mouth every 8 (eight) hours.   15 tablet   0   . loratadine (CLARITIN) 10 MG tablet   Oral   Take 10 mg by mouth daily.         . metoCLOPramide (REGLAN) 5 MG tablet   Oral   Take 1 tablet (5 mg total) by mouth every 8 (eight) hours as needed for nausea or vomiting.   15 tablet   0   . penicillin  v potassium (VEETID) 500 MG tablet   Oral   Take 1 tablet (500 mg total) by mouth 4 (four) times daily.   40 tablet   0   . sulfamethoxazole-trimethoprim (BACTRIM DS,SEPTRA DS) 800-160 MG tablet   Oral   Take 1 tablet by mouth 2 (two) times daily.   19 tablet   0   . traMADol (ULTRAM) 50 MG tablet   Oral   Take 1 tablet (50 mg total) by mouth every 6 (six) hours as needed.   20 tablet   0    Allergies Latex and Morphine and related  No family history on file.  Social History Social History  Substance Use Topics  . Smoking status: Current Every Day Smoker -- 0.50 packs/day    Types: Cigarettes  . Smokeless tobacco: None  . Alcohol Use: No    Review of Systems  Constitutional: Negative for fever. Musculoskeletal: Negative for back pain. Skin: Negative for rash. Insect bite as above Neurological: Negative for headaches, focal weakness or numbness. ____________________________________________  PHYSICAL EXAM:  VITAL SIGNS: ED Triage Vitals  Enc Vitals Group     BP 08/10/15 1756 160/86 mmHg     Pulse --  Resp 08/10/15 1756 18     Temp 08/10/15 1756 98.1 F (36.7 C)     Temp Source 08/10/15 1756 Oral     SpO2 08/10/15 1756 97 %     Weight 08/10/15 1756 165 lb (74.844 kg)     Height 08/10/15 1756  (1.676 m)     Head Cir --      Peak Flow --      Pain Score 08/10/15 1804 9     Pain Loc --      Pain Edu? --      Excl. in GC? --    Constitutional: Alert and oriented. Well appearing and in no distress. Head: Normocephalic and atraumatic. Cardiovascular: Normal distal pulses.  Respiratory: Normal respiratory effort.  Musculoskeletal: Nontender with normal range of motion in all extremities.  Neurologic:  Normal gait without ataxia. Normal speech and language. No gross focal neurologic deficits are appreciated. Skin:  Skin is warm, dry and intact. No rash noted. Well-circumscribed local area of erythema, warmth, and induration with a central papule. No  lymphangitis, drainage, or edema noted.  ____________________________________________  PROCEDURES  Bactrim DS 1 PO ____________________________________________  INITIAL IMPRESSION / ASSESSMENT AND PLAN / ED COURSE  Patient with a likely local reaction to an insect bite to the right lower extremity. She will be discharged with a prescription for Bactrim to dose as directed for a local cellulitis of the lower extremities. She is advised to rest with the leg elevated as necessary apply warm compresses to promote healing. She should follow-up with the primary care provider or local community clinic for ongoing symptom management. ____________________________________________  FINAL CLINICAL IMPRESSION(S) / ED DIAGNOSES  Final diagnoses:  Cellulitis of right lower extremity  Insect bite of lower leg with local reaction, right, initial encounter      Lissa Hoard, PA-C 08/13/15 1924  Sharman Cheek, MD 08/14/15 [phone]

## 2016-02-05 ENCOUNTER — Encounter: Payer: Self-pay | Admitting: Emergency Medicine

## 2016-02-05 ENCOUNTER — Emergency Department
Admission: EM | Admit: 2016-02-05 | Discharge: 2016-02-05 | Disposition: A | Discharge status: Home / Self Care | Payer: Self-pay | Attending: Emergency Medicine | Admitting: Emergency Medicine

## 2016-02-05 DIAGNOSIS — K0889 Other specified disorders of teeth and supporting structures: Secondary | ICD-10-CM | POA: Insufficient documentation

## 2016-02-05 DIAGNOSIS — E119 Type 2 diabetes mellitus without complications: Secondary | ICD-10-CM | POA: Insufficient documentation

## 2016-02-05 DIAGNOSIS — Z79899 Other long term (current) drug therapy: Secondary | ICD-10-CM | POA: Insufficient documentation

## 2016-02-05 DIAGNOSIS — J069 Acute upper respiratory infection, unspecified: Secondary | ICD-10-CM | POA: Insufficient documentation

## 2016-02-05 DIAGNOSIS — Z91040 Latex allergy status: Secondary | ICD-10-CM | POA: Insufficient documentation

## 2016-02-05 DIAGNOSIS — F17210 Nicotine dependence, cigarettes, uncomplicated: Secondary | ICD-10-CM | POA: Insufficient documentation

## 2016-02-05 MED ORDER — NAPROXEN 500 MG PO TABS: 500.0000 mg | ORAL_TABLET | Freq: Two times a day (BID) | ORAL | 0 refills | Status: DC

## 2016-02-05 MED ORDER — AMOXICILLIN 500 MG PO TABS: 500.0000 mg | ORAL_TABLET | Freq: Two times a day (BID) | ORAL | 0 refills | Status: DC

## 2016-02-05 MED ORDER — BENZONATATE 100 MG PO CAPS: 100.0000 mg | ORAL_CAPSULE | Freq: Four times a day (QID) | ORAL | 0 refills | Status: DC | PRN

## 2016-02-05 NOTE — ED Notes (Addendum)
Dental pain, cough and congestion. Pt alert and oriented X4, active, cooperative, pt in NAD. RR even and unlabored, color WNL.

## 2016-02-05 NOTE — Discharge Instructions (Signed)
Please call and schedule a dental appointment as soon as possible. You will need to be seen within the next 14 days. Return to the emergency department for symptoms that change or worsen if you're unable to schedule an appointment.  OPTIONS FOR DENTAL FOLLOW UP CARE  [place] Department of Health and Human Services - Local Safety Net Dental Clinics http://www.ncdhhs.gov/dph/oralhealth/services/safetynetclinics.htm   [facility] ([phone])  [facility] ([phone])  [facility] ([phone])  [facility] ([phone])  [facility] ([phone]) This clinic caters to the indigent population and is on a lottery system. Location: [facility], [address], [place] Clinic Hours: Wednesdays from 6pm - 9pm, patients seen by a lottery system. For dates, call or go to [email or web address] Services: Cleanings, fillings and simple extractions. Payment Options: DENTAL WORK IS FREE OF CHARGE. Bring proof of income or support. Best way to get seen: Arrive at 5:15 pm - this is a lottery, NOT first come/first serve, so arriving earlier will not increase your chances of being seen.     [facility] [phone] Select option 1 for emergencies   Location: [facility], [address], [place] Clinic Hours: No walk-ins accepted - call the day before to schedule an appointment. Check in times are 9:30 am and 1:30 pm. Services: Simple extractions, temporary fillings, pulpectomy/pulp debridement, uncomplicated abscess drainage. Payment Options: PAYMENT IS DUE AT THE TIME OF SERVICE.  Fee is usually $100-200, additional surgical procedures (e.g. abscess drainage) may be extra. Cash, checks, Visa/MasterCard accepted.  Can file Medicaid if patient is covered for dental - patient should call case worker to check. No  discount for [facility] patients. Best way to get seen: MUST call the day before and get onto the schedule. Can usually be seen the next 1-2 days. No walk-ins accepted.     [facility] [phone]   Location: [facility], [address], [place] Clinic Hours: M, W, Th, F 8am or 1:30pm, Tues 9a or 1:30 - first come/first served. Services: Simple extractions, temporary fillings, uncomplicated abscess drainage.  You do not need to be an [place] resident. Payment Options: PAYMENT IS DUE AT THE TIME OF SERVICE. Dental insurance, otherwise sliding scale - bring proof of income or support. Depending on income and treatment needed, cost is usually $50-200. Best way to get seen: Arrive early as it is first come/first served.     [facility] [phone]   Location: [address] Clinic Hours: Mon-Thu 8a-5p Services: Most basic dental services including extractions and fillings. Payment Options: PAYMENT IS DUE AT THE TIME OF SERVICE. Sliding scale, up to 50% off - bring proof if income or support. Medicaid with dental option accepted. Best way to get seen: Call to schedule an appointment, can usually be seen within 2 weeks OR they will try to see walk-ins - show up at 8a or 2p (you may have to wait).     [facility] [phone] [place]IDENTS ONLY   Location: [facility], [address], [place],  [address] Clinic Hours: By appointment only. Monday - Thursday 8am-5pm, Friday 8am-12pm Services: Cleanings, fillings, extractions. Payment Options: PAYMENT IS DUE AT THE TIME OF SERVICE. Cash, Visa or MasterCard. Sliding scale - $30 minimum per service. Best way to get seen: Come in to office, complete packet and make an appointment -   need proof of income or support monies for each household member and proof of [place]  residence. Usually takes about a month to get in.     [facility] [phone]   Location: [address] [place] Clinic Hours: Walk-in Urgent Care Dental Services are offered Monday-Friday mornings only. The numbers of emergencies accepted daily is limited to the number of providers available. Maximum 15 - Mondays, Wednesdays & Thursdays Maximum 10 - Tuesdays & Fridays Services: You do not need to be a [place] resident to be seen for a dental emergency. Emergencies are defined as pain, swelling, abnormal bleeding, or dental trauma. Walkins will receive x-rays if needed. NOTE: Dental cleaning is not an emergency. Payment Options: PAYMENT IS DUE AT THE TIME OF SERVICE. Minimum co-pay is $40.00 for uninsured patients. Minimum co-pay is $3.00 for Medicaid with dental coverage. Dental Insurance is accepted and must be presented at time of visit. Medicare does not cover dental. Forms of payment: Cash, credit card, checks. Best way to get seen: If not previously registered with the clinic, walk-in dental registration begins at 7:15 am and is on a first come/first serve basis. If previously registered with the clinic, call to make an appointment.     [facility] [phone] [place] RESIDENTS ONLY   Location: [address], [place],  Clinic Hours: Mon-Thu 10a-2p Services: Extractions only! Payment Options: FREE (donations accepted) - bring proof of income or support Best way to get seen: Call and schedule an appointment OR come at 8am on the 1st Monday of every month (except for holidays) when it is first come/first served.     [facility] [phone]   Location: [address], [place] Clinic Hours: Friday mornings Services, Payment Options, Best way to get seen: Call for info  

## 2016-02-05 NOTE — ED Triage Notes (Signed)
Pt states that she has been having dental pain in the upper left gum x 3-4 days. Pt also c/o of cold and cough. Pt is ambulatory with NAD noted at this time.

## 2016-02-05 NOTE — ED Provider Notes (Signed)
[facility] Emergency Department Provider Note ____________________________________________  Time seen: Approximately 10:18 PM  I have reviewed the triage vital signs and the nursing notes.   HISTORY  Chief Complaint Dental Pain   HPI Brittany Bates is a 40 y.o. female who presents to the emergency department for evaluation of dental pain.She reports having chronic dental pain and is [facility]. Pain started worsening about 4 days ago and she now has some redness and swelling in the gum. She also complains of cough for the past 2 weeks without fever or other symptoms.  Past Medical History:  Diagnosis Date  . Bronchitis   . Diabetes mellitus without complication (HCC)     There are no active problems to display for this patient.   Past Surgical History:  Procedure Laterality Date  . Abcess removal      Prior to Admission medications   Medication Sig Start Date End Date Taking? Authorizing Provider  amoxicillin (AMOXIL) 500 MG tablet Take 1 tablet (500 mg total) by mouth 2 (two) times daily. 02/05/16   Chinita Pesterari B Vaishnav Demartin, FNP  benzonatate (TESSALON PERLES) 100 MG capsule Take 1 capsule (100 mg total) by mouth every 6 (six) hours as needed for cough. 02/05/16 02/04/17  Chinita Pesterari B Avika Carbine, FNP  ketorolac (TORADOL) 10 MG tablet Take 1 tablet (10 mg total) by mouth every 8 (eight) hours. 04/21/15   Jenise V Bacon Menshew, PA-C  loratadine (CLARITIN) 10 MG tablet Take 10 mg by mouth daily.    Historical Provider, MD  metoCLOPramide (REGLAN) 5 MG tablet Take 1 tablet (5 mg total) by mouth every 8 (eight) hours as needed for nausea or vomiting. 04/21/15   Jenise V Bacon Menshew, PA-C  naproxen (NAPROSYN) 500 MG tablet Take 1 tablet (500 mg total) by mouth 2 (two) times daily with a meal. 02/05/16 02/04/17  Chinita Pesterari B Zerek Litsey, FNP    Allergies Latex and Morphine and related  No family history on file.  Social History Social History    Substance Use Topics  . Smoking status: Current Every Day Smoker    Packs/day: 0.50    Types: Cigarettes  . Smokeless tobacco: Never Used  . Alcohol use No    Review of Systems Constitutional: Negative for fever ENT: Positive for dental pain Musculoskeletal: Negative for jaw pain/trismus. Skin: Negative for swelling Respiratory: Positive for cough ____________________________________________   PHYSICAL EXAM:  VITAL SIGNS: ED Triage Vitals  Enc Vitals Group     BP 02/05/16 2146 132/87     Pulse Rate 02/05/16 2146 100     Resp 02/05/16 2146 18     Temp 02/05/16 2146 98.2 F (36.8 C)     Temp Source 02/05/16 2146 Oral     SpO2 02/05/16 2146 98 %     Weight 02/05/16 2147 160 lb (72.6 kg)     Height 02/05/16 2147 5\' 6"  (1.676 m)     Head Circumference --      Peak Flow --      Pain Score 02/05/16 2148 6     Pain Loc --      Pain Edu? --      Excl. in GC? --     Constitutional: Alert and oriented. Well appearing and in no acute distress. Eyes: Conjunctivae are normal.EOMI. Mouth/Throat: Mucous membranes are moist. Oropharynx non-erythematous. Periodontal Exam    Hematological/Lymphatic/Immunilogical: No cervical lymphadenopathy. Respiratory: Normal respiratory effort. Breath sounds clear to auscultation throughout Musculoskeletal: Full  ROM x 4 extremities. Neurologic:  Normal speech and language. No gross focal neurologic deficits are appreciated. Speech is normal. No gait instability. Skin:  See periodontal exam Psychiatric: Mood and affect are normal. Speech and behavior are normal.  ____________________________________________   LABS (all labs ordered are listed, but only abnormal results are displayed)  Labs Reviewed - No data to display ____________________________________________   RADIOLOGY  Not indicated ____________________________________________   PROCEDURES  Procedure(s) performed: None  Critical Care performed:  No  ____________________________________________   INITIAL IMPRESSION / ASSESSMENT AND PLAN / ED COURSE  Pertinent labs & imaging results that were available during my care of the patient were reviewed by me and considered in my medical decision making (see chart for details). Patient will be prescribed amoxicillin and Tessalon Perles. Patient was advised to see the dentist within 14 days and primary care provider of her choice for cough that does not improve over the next few days. Also advised to take the antibiotic until finished. Instructed to return to the ER for symptoms that change or worsen if unable to schedule an appointment. ____________________________________________   FINAL CLINICAL IMPRESSION(S) / ED DIAGNOSES  Final diagnoses:  Pain, dental  Viral upper respiratory tract infection    Note:  This document was prepared using Dragon voice recognition software and may include unintentional dictation errors.    Chinita PesterCari B Abbigayle Toole, FNP 02/05/16 2314    Sharyn CreamerMark Quale, MD 02/06/16 [phone]

## 2016-02-05 NOTE — ED Notes (Signed)
Pt alert and oriented X4, active, cooperative, pt in NAD. RR even and unlabored, color WNL.  Pt informed to return if any life threatening symptoms occur.   

## 2016-03-06 ENCOUNTER — Emergency Department
Admission: EM | Admit: 2016-03-06 | Discharge: 2016-03-06 | Disposition: A | Discharge status: Home / Self Care | Payer: Self-pay | Attending: Emergency Medicine | Admitting: Emergency Medicine

## 2016-03-06 DIAGNOSIS — K029 Dental caries, unspecified: Secondary | ICD-10-CM | POA: Insufficient documentation

## 2016-03-06 DIAGNOSIS — E119 Type 2 diabetes mellitus without complications: Secondary | ICD-10-CM | POA: Insufficient documentation

## 2016-03-06 DIAGNOSIS — K0889 Other specified disorders of teeth and supporting structures: Secondary | ICD-10-CM | POA: Insufficient documentation

## 2016-03-06 DIAGNOSIS — F17210 Nicotine dependence, cigarettes, uncomplicated: Secondary | ICD-10-CM | POA: Insufficient documentation

## 2016-03-06 DIAGNOSIS — K047 Periapical abscess without sinus: Secondary | ICD-10-CM | POA: Insufficient documentation

## 2016-03-06 DIAGNOSIS — Z79899 Other long term (current) drug therapy: Secondary | ICD-10-CM | POA: Insufficient documentation

## 2016-03-06 MED ORDER — AMOXICILLIN 500 MG PO CAPS: 500.0000 mg | ORAL_CAPSULE | Freq: Three times a day (TID) | ORAL | 0 refills | Status: DC

## 2016-03-06 MED ORDER — NAPROXEN 500 MG PO TABS: 500.0000 mg | ORAL_TABLET | Freq: Two times a day (BID) | ORAL | 0 refills | Status: DC

## 2016-03-06 NOTE — ED Notes (Signed)
States she developed tooth pain about 3 days ago   Min swelling to face

## 2016-03-06 NOTE — Discharge Instructions (Signed)
OPTIONS FOR DENTAL FOLLOW UP CARE ° °[place] Department of Health and Human Services - Local Safety Net Dental Clinics °http://www.ncdhhs.gov/dph/oralhealth/services/safetynetclinics.htm °  °[facility] ([phone]) ° °[facility] ([phone]) ° °[facility] ([phone]) ° °[facility] ([phone]) ° °[facility] ([phone]) °This clinic caters to the indigent population and is on a lottery system. °Location: °[facility], [address], [place] °Clinic Hours: °Wednesdays from 6pm - 9pm, patients seen by a lottery system. °For dates, call or go to [email or web address] °Services: °Cleanings, fillings and simple extractions. °Payment Options: °DENTAL WORK IS FREE OF CHARGE. Bring proof of income or support. °Best way to get seen: °Arrive at 5:15 pm - this is a lottery, NOT first come/first serve, so arriving earlier will not increase your chances of being seen. °  °  °[facility] °[phone] °Select option 1 for emergencies °  °Location: °[facility], [address], [place] °Clinic Hours: °No walk-ins accepted - call the day before to schedule an appointment. °Check in times are 9:30 am and 1:30 pm. °Services: °Simple extractions, temporary fillings, pulpectomy/pulp debridement, uncomplicated abscess drainage. °Payment Options: °PAYMENT IS DUE AT THE TIME OF SERVICE.  Fee is usually $100-200, additional surgical procedures (e.g. abscess drainage) may be extra. °Cash, checks, Visa/MasterCard accepted.  Can file Medicaid if patient is covered for dental - patient should call case worker to check. °No discount for [facility] patients. °Best way to get seen: °MUST call the day before and get onto the schedule. Can usually be seen the next 1-2 days. No walk-ins accepted. °  °  °[facility] °[phone] °   °Location: °[facility], [address], [place] °Clinic Hours: °M, W, Th, F 8am or 1:30pm, Tues 9a or 1:30 - first come/first served. °Services: °Simple extractions, temporary fillings, uncomplicated abscess drainage.  You do not need to be an [place] resident. °Payment Options: °PAYMENT IS DUE AT THE TIME OF SERVICE. °Dental insurance, otherwise sliding scale - bring proof of income or support. °Depending on income and treatment needed, cost is usually $50-200. °Best way to get seen: °Arrive early as it is first come/first served. °  °  °[facility] °[phone] °  °Location: °[address] °Clinic Hours: °Mon-Thu 8a-5p °Services: °Most basic dental services including extractions and fillings. °Payment Options: °PAYMENT IS DUE AT THE TIME OF SERVICE. °Sliding scale, up to 50% off - bring proof if income or support. °Medicaid with dental option accepted. °Best way to get seen: °Call to schedule an appointment, can usually be seen within 2 weeks OR they will try to see walk-ins - show up at 8a or 2p (you may have to wait). °  °  °[facility] °[phone] °[place] RESIDENTS ONLY °  °Location: °[facility], [address], [place], [place] [address] °Clinic Hours: By appointment only. °Monday - Thursday 8am-5pm, Friday 8am-12pm °Services: Cleanings, fillings, extractions. °Payment Options: °PAYMENT IS DUE AT THE TIME OF SERVICE. °Cash, Visa or MasterCard. Sliding scale - $30 minimum per service. °Best way to get seen: °Come in to office, complete packet and make an appointment - need proof of income °or support monies for each household member and proof of [place] residence. °Usually takes about a month to get in. °  °  °[facility] °[phone] °  °Location: °[address]   [place] °Clinic Hours: Walk-in Urgent Care Dental Services are offered Monday-Friday  mornings only. °The numbers of emergencies accepted daily is limited to the number of °providers available. °Maximum 15 - Mondays, Wednesdays & Thursdays °Maximum 10 - Tuesdays & Fridays °Services: °You do not need to be a [place] resident to be seen for a dental emergency. °Emergencies are defined as pain, swelling, abnormal bleeding, or dental trauma. Walkins will receive x-rays if needed. °NOTE: Dental cleaning is not an emergency. °Payment Options: °PAYMENT IS DUE AT THE TIME OF SERVICE. °Minimum co-pay is $40.00 for uninsured patients. °Minimum co-pay is $3.00 for Medicaid with dental coverage. °Dental Insurance is accepted and must be presented at time of visit. °Medicare does not cover dental. °Forms of payment: Cash, credit card, checks. °Best way to get seen: °If not previously registered with the clinic, walk-in dental registration begins at 7:15 am and is on a first come/first serve basis. °If previously registered with the clinic, call to make an appointment. °  °  °[facility] °[phone] °[place] RESIDENTS ONLY °  °Location: °[address], [place], [place] °Clinic Hours: °Mon-Thu 10a-2p °Services: Extractions only! °Payment Options: °FREE (donations accepted) - bring proof of income or support °Best way to get seen: °Call and schedule an appointment OR come at 8am on the 1st Monday of every month (except for holidays) when it is first come/first served. °  °  °[facility] °[phone] °  °Location: °[address], [place] °Clinic Hours: °Friday mornings °Services, Payment Options, Best way to get seen: °Call for info °

## 2016-03-06 NOTE — ED Triage Notes (Signed)
Toothache to left side and left sided facial pain X 3 days. Hx of similar to that side.

## 2016-03-06 NOTE — ED Provider Notes (Signed)
[facility] Emergency Department Provider Note   ____________________________________________   First MD Initiated Contact with Patient 03/06/16 [phone]     (approximate)  I have reviewed the triage vital signs and the nursing notes.   HISTORY  Chief Complaint Facial Pain and Dental Pain   HPI Brittany Bates is a 40 y.o. female is here with complaint of left-sided facial pain for the last 3 days. Patient states that she gets frequent dental abscesses and believes that this is what she has at present. She denies any fever or chills. There's been no nausea or vomiting. She has taken some over-the-counter medications without any improvement of her pain. She states she was here last month for a dental infection also. Currently she rates her pain as an 8 out of 10.   Past Medical History:  Diagnosis Date  . Bronchitis   . Diabetes mellitus without complication (HCC)     There are no active problems to display for this patient.   Past Surgical History:  Procedure Laterality Date  . Abcess removal      Prior to Admission medications   Medication Sig Start Date End Date Taking? Authorizing Provider  amoxicillin (AMOXIL) 500 MG capsule Take 1 capsule (500 mg total) by mouth 3 (three) times daily. 03/06/16   Tommi Rumpshonda L Nila Winker, PA-C  loratadine (CLARITIN) 10 MG tablet Take 10 mg by mouth daily.    Historical Provider, MD  naproxen (NAPROSYN) 500 MG tablet Take 1 tablet (500 mg total) by mouth 2 (two) times daily with a meal. 03/06/16   Tommi Rumpshonda L Anslee Micheletti, PA-C    Allergies Latex and Morphine and related  No family history on file.  Social History Social History  Substance Use Topics  . Smoking status: Current Every Day Smoker    Packs/day: 0.50    Types: Cigarettes  . Smokeless tobacco: Never Used  . Alcohol use No    Review of Systems Constitutional: No fever/chills Eyes: No visual changes. ENT: Other dental pain. Cardiovascular: Denies chest  pain. Respiratory: Denies shortness of breath. Gastrointestinal:   No nausea, no vomiting.   Skin: Negative for rash. Neurological: Negative for headaches, focal weakness or numbness.  10-point ROS otherwise negative.  ____________________________________________   PHYSICAL EXAM:  VITAL SIGNS: ED Triage Vitals  Enc Vitals Group     BP 03/06/16 0940 127/79     Pulse Rate 03/06/16 0939 97     Resp 03/06/16 0939 18     Temp 03/06/16 0939 98.1 F (36.7 C)     Temp Source 03/06/16 0939 Oral     SpO2 03/06/16 0939 97 %     Weight 03/06/16 0939 160 lb (72.6 kg)     Height 03/06/16 0939 5\' 6"  (1.676 m)     Head Circumference --      Peak Flow --      Pain Score 03/06/16 0939 8     Pain Loc --      Pain Edu? --      Excl. in GC? --     Constitutional: Alert and oriented. Well appearing and in no acute distress. Eyes: Conjunctivae are normal. PERRL. EOMI. Head: Atraumatic. Nose: No congestion/rhinnorhea. Mouth/Throat: Mucous membranes are moist.  Oropharynx non-erythematous.Multiple dental caries is noted with very poor dental hygiene. Left upper gum is swollen around several teeth on the lateral aspect with no obvious abscess seen. Minimal facial swelling is noted. Neck: No stridor.   Hematological/Lymphatic/Immunilogical: No cervical lymphadenopathy. Cardiovascular: Normal rate,  regular rhythm. Grossly normal heart sounds.  Good peripheral circulation. Respiratory: Normal respiratory effort.  No retractions. Lungs CTAB. Gastrointestinal: Soft and nontender. No distention. No abdominal bruits. No CVA tenderness. Musculoskeletal: No lower extremity tenderness nor edema.  No joint effusions. Neurologic:  Normal speech and language. No gross focal neurologic deficits are appreciated.  Skin:  Skin is warm, dry and intact. No rash noted. Psychiatric: Mood and affect are normal. Speech and behavior are normal.  ____________________________________________   LABS (all labs ordered  are listed, but only abnormal results are displayed)  Labs Reviewed - No data to display    PROCEDURES  Procedure(s) performed: None  Procedures  Critical Care performed: No  ____________________________________________   INITIAL IMPRESSION / ASSESSMENT AND PLAN / ED COURSE  Pertinent labs & imaging results that were available during my care of the patient were reviewed by me and considered in my medical decision making (see chart for details).    Clinical Course    Patient was placed on amoxicillin 500 mg 3 times a day for 10 days and Naprosyn 500 mg twice a day with food. Patient was given a list of dental clinics in the area to get established with. She is encouraged to call tomorrow about getting an appointment.  ____________________________________________   FINAL CLINICAL IMPRESSION(S) / ED DIAGNOSES  Final diagnoses:  Pain due to dental caries  Dental infection      NEW MEDICATIONS STARTED DURING THIS VISIT:  Discharge Medication List as of 03/06/2016 10:09 AM    START taking these medications   Details  amoxicillin (AMOXIL) 500 MG capsule Take 1 capsule (500 mg total) by mouth 3 (three) times daily., Starting Sun 03/06/2016, Print         Note:  This document was prepared using Dragon voice recognition software and may include unintentional dictation errors.    Tommi RumpsRhonda L Masami Plata, PA-C 03/06/16 1430    Minna AntisKevin Paduchowski, MD 03/06/16 1454

## 2016-03-24 ENCOUNTER — Emergency Department
Admission: EM | Admit: 2016-03-24 | Discharge: 2016-03-24 | Disposition: A | Discharge status: Home / Self Care | Payer: Self-pay | Attending: Emergency Medicine | Admitting: Emergency Medicine

## 2016-03-24 ENCOUNTER — Emergency Department: Payer: Self-pay

## 2016-03-24 ENCOUNTER — Encounter: Payer: Self-pay | Admitting: *Deleted

## 2016-03-24 DIAGNOSIS — E119 Type 2 diabetes mellitus without complications: Secondary | ICD-10-CM | POA: Insufficient documentation

## 2016-03-24 DIAGNOSIS — F17210 Nicotine dependence, cigarettes, uncomplicated: Secondary | ICD-10-CM | POA: Insufficient documentation

## 2016-03-24 DIAGNOSIS — M7732 Calcaneal spur, left foot: Secondary | ICD-10-CM | POA: Insufficient documentation

## 2016-03-24 DIAGNOSIS — Z79899 Other long term (current) drug therapy: Secondary | ICD-10-CM | POA: Insufficient documentation

## 2016-03-24 MED ORDER — NAPROXEN 500 MG PO TABS: 500.0000 mg | ORAL_TABLET | Freq: Two times a day (BID) | ORAL | Status: DC

## 2016-03-24 MED ORDER — NAPROXEN 500 MG PO TABS
500.0000 mg | ORAL_TABLET | Freq: Once | ORAL | Status: AC
  Administered 2016-03-24: 500 mg via ORAL
  Filled 2016-03-24: qty 1

## 2016-03-24 NOTE — ED Triage Notes (Signed)
Pt to triage via wheelchair.  Pt has left heel pain.  no known injury  Sx fro 3 weeks.

## 2016-03-24 NOTE — Discharge Instructions (Signed)
Advised patient over-the-counter heels support pending evaluation by podiatry.

## 2016-03-24 NOTE — ED Provider Notes (Signed)
[facility] Emergency Department Provider Note   ____________________________________________   First MD Initiated Contact with Patient 03/24/16 1655     (approximate)  I have reviewed the triage vital signs and the nursing notes.   HISTORY  Chief Complaint Foot Pain    HPI Brittany Bates is a 40 y.o. female patient complaining of left heel pain for 2-3 weeks. Patient denies any provocative incident for her complaint. Patient denies prolonged standing. However patient states pain increase with standing. No palliative measures except for nonweightbearing for this complaint. Patient rates the pain as 8/10.   Past Medical History:  Diagnosis Date  . Bronchitis   . Diabetes mellitus without complication (HCC)     There are no active problems to display for this patient.   Past Surgical History:  Procedure Laterality Date  . Abcess removal      Prior to Admission medications   Medication Sig Start Date End Date Taking? Authorizing Provider  amoxicillin (AMOXIL) 500 MG capsule Take 1 capsule (500 mg total) by mouth 3 (three) times daily. 03/06/16   Tommi Rumpshonda L Summers, PA-C  loratadine (CLARITIN) 10 MG tablet Take 10 mg by mouth daily.    Historical Provider, MD  naproxen (NAPROSYN) 500 MG tablet Take 1 tablet (500 mg total) by mouth 2 (two) times daily with a meal. 03/06/16   Tommi Rumpshonda L Summers, PA-C  naproxen (NAPROSYN) 500 MG tablet Take 1 tablet (500 mg total) by mouth 2 (two) times daily with a meal. 03/24/16   Joni Reiningonald K Myrna Vonseggern, PA-C    Allergies Latex and Morphine and related  No family history on file.  Social History Social History  Substance Use Topics  . Smoking status: Current Every Day Smoker    Packs/day: 0.50    Types: Cigarettes  . Smokeless tobacco: Never Used  . Alcohol use No    Review of Systems Constitutional: No fever/chills Eyes: No visual changes. ENT: No sore throat. Cardiovascular: Denies chest pain. Respiratory:  Denies shortness of breath. Gastrointestinal: No abdominal pain.  No nausea, no vomiting.  No diarrhea.  No constipation. Genitourinary: Negative for dysuria. Musculoskeletal: Left heel pain  Skin: Negative for rash. Neurological: Negative for headaches, focal weakness or numbness. Endocrine:Diabetes _____________   PHYSICAL EXAM:  VITAL SIGNS: ED Triage Vitals [03/24/16 1631]  Enc Vitals Group     BP 120/73     Pulse Rate (!) 118     Resp 20     Temp 98.4 F (36.9 C)     Temp Source Oral     SpO2 99 %     Weight 160 lb (72.6 kg)     Height 5\' 6"  (1.676 m)     Head Circumference      Peak Flow      Pain Score 8     Pain Loc      Pain Edu?      Excl. in GC?     Constitutional: Alert and oriented. Well appearing and in no acute distress. Eyes: Conjunctivae are normal. PERRL. EOMI. Head: Atraumatic. Nose: No congestion/rhinnorhea. Mouth/Throat: Mucous membranes are moist.  Oropharynx non-erythematous. Neck: No stridor. No cervical spine tenderness to palpation.* Hematological/Lymphatic/Immunilogical: No cervical lymphadenopathy. Cardiovascular: Normal rate, regular rhythm. Grossly normal heart sounds.  Good peripheral circulation. Respiratory: Normal respiratory effort.  No retractions. Lungs CTAB. Gastrointestinal: Soft and nontender. No distention. No abdominal bruits. No CVA tenderness. Musculoskeletal: No obvious deformity to the left ankle. No edema or erythema. Patient demonstrated moderate  guarding palpation of the plantar aspect of the left heel. Neurologic:  Normal speech and language. No gross focal neurologic deficits are appreciated. No gait instability. Skin:  Skin is warm, dry and intact. No rash noted. Psychiatric: Mood and affect are normal. Speech and behavior are normal.  ____________________________________________   LABS (all labs ordered are listed, but only abnormal results are displayed)  Labs Reviewed - No data to  display ____________________________________________  EKG   ____________________________________________  RADIOLOGY  Small heel spur left foot. ____________________________________________   PROCEDURES  Procedure(s) performed: None  Procedures  Critical Care performed: No  ____________________________________________   INITIAL IMPRESSION / ASSESSMENT AND PLAN / ED COURSE  Pertinent labs & imaging results that were available during my care of the patient were reviewed by me and considered in my medical decision making (see chart for details).  Heel spur left foot. Patient given discharge care instructions. Patient get a prescription for naproxen. Patient advised to purchase over-the-counter heels support and follow-up with podiatry as needed.  Clinical Course      ____________________________________________   FINAL CLINICAL IMPRESSION(S) / ED DIAGNOSES  Final diagnoses:  Heel spur, left      NEW MEDICATIONS STARTED DURING THIS VISIT:  New Prescriptions   NAPROXEN (NAPROSYN) 500 MG TABLET    Take 1 tablet (500 mg total) by mouth 2 (two) times daily with a meal.     Note:  This document was prepared using Dragon voice recognition software and may include unintentional dictation errors.    Joni ReiningRonald K Prabhnoor Ellenberger, PA-C 03/24/16 1733    Myrna Blazeravid Matthew Schaevitz, MD 03/24/16 2221

## 2016-03-24 NOTE — ED Notes (Signed)
See triage note   Heel pain for about 3 weeks  Pain increases with standing  No injury

## 2016-04-30 ENCOUNTER — Emergency Department
Admission: EM | Admit: 2016-04-30 | Discharge: 2016-04-30 | Disposition: A | Discharge status: Home / Self Care | Payer: No Typology Code available for payment source | Attending: Emergency Medicine | Admitting: Emergency Medicine

## 2016-04-30 ENCOUNTER — Encounter: Payer: Self-pay | Admitting: Emergency Medicine

## 2016-04-30 DIAGNOSIS — Y92410 Unspecified street and highway as the place of occurrence of the external cause: Secondary | ICD-10-CM | POA: Insufficient documentation

## 2016-04-30 DIAGNOSIS — S161XXA Strain of muscle, fascia and tendon at neck level, initial encounter: Secondary | ICD-10-CM

## 2016-04-30 DIAGNOSIS — F17210 Nicotine dependence, cigarettes, uncomplicated: Secondary | ICD-10-CM | POA: Diagnosis not present

## 2016-04-30 DIAGNOSIS — E119 Type 2 diabetes mellitus without complications: Secondary | ICD-10-CM | POA: Insufficient documentation

## 2016-04-30 DIAGNOSIS — Z79899 Other long term (current) drug therapy: Secondary | ICD-10-CM | POA: Insufficient documentation

## 2016-04-30 DIAGNOSIS — Z791 Long term (current) use of non-steroidal anti-inflammatories (NSAID): Secondary | ICD-10-CM | POA: Insufficient documentation

## 2016-04-30 DIAGNOSIS — Z91040 Latex allergy status: Secondary | ICD-10-CM | POA: Diagnosis not present

## 2016-04-30 DIAGNOSIS — Y9389 Activity, other specified: Secondary | ICD-10-CM | POA: Diagnosis not present

## 2016-04-30 DIAGNOSIS — S199XXA Unspecified injury of neck, initial encounter: Secondary | ICD-10-CM | POA: Diagnosis present

## 2016-04-30 DIAGNOSIS — Y999 Unspecified external cause status: Secondary | ICD-10-CM | POA: Diagnosis not present

## 2016-04-30 MED ORDER — ONDANSETRON 4 MG PO TBDP
4.0000 mg | ORAL_TABLET | Freq: Once | ORAL | Status: AC
  Administered 2016-04-30: 4 mg via ORAL
  Filled 2016-04-30: qty 1

## 2016-04-30 MED ORDER — TRAMADOL HCL 50 MG PO TABS
50.0000 mg | ORAL_TABLET | Freq: Once | ORAL | Status: AC
Start: 2016-04-30 — End: 2016-04-30
  Administered 2016-04-30: 50 mg via ORAL
  Filled 2016-04-30: qty 1

## 2016-04-30 MED ORDER — TRAMADOL HCL 50 MG PO TABS: 50.0000 mg | ORAL_TABLET | Freq: Four times a day (QID) | ORAL | 0 refills | Status: DC | PRN

## 2016-04-30 MED ORDER — PROMETHAZINE HCL 25 MG PO TABS: 25.0000 mg | ORAL_TABLET | Freq: Four times a day (QID) | ORAL | 0 refills | Status: DC | PRN
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
## 2016-05-25 ENCOUNTER — Emergency Department
Admission: EM | Admit: 2016-05-25 | Discharge: 2016-05-25 | Disposition: A | Discharge status: Home / Self Care | Payer: Self-pay | Attending: Emergency Medicine | Admitting: Emergency Medicine

## 2016-05-25 ENCOUNTER — Encounter: Payer: Self-pay | Admitting: Emergency Medicine

## 2016-05-25 DIAGNOSIS — L723 Sebaceous cyst: Secondary | ICD-10-CM

## 2016-05-25 DIAGNOSIS — L089 Local infection of the skin and subcutaneous tissue, unspecified: Secondary | ICD-10-CM

## 2016-05-25 DIAGNOSIS — Z09 Encounter for follow-up examination after completed treatment for conditions other than malignant neoplasm: Secondary | ICD-10-CM

## 2016-05-25 DIAGNOSIS — L03313 Cellulitis of chest wall: Secondary | ICD-10-CM | POA: Insufficient documentation

## 2016-05-25 DIAGNOSIS — E119 Type 2 diabetes mellitus without complications: Secondary | ICD-10-CM | POA: Insufficient documentation

## 2016-05-25 DIAGNOSIS — F17210 Nicotine dependence, cigarettes, uncomplicated: Secondary | ICD-10-CM | POA: Insufficient documentation

## 2016-05-25 DIAGNOSIS — Z91040 Latex allergy status: Secondary | ICD-10-CM | POA: Insufficient documentation

## 2016-05-25 MED ORDER — SULFAMETHOXAZOLE-TRIMETHOPRIM 800-160 MG PO TABS: 1.0000 | ORAL_TABLET | Freq: Two times a day (BID) | ORAL | 0 refills | Status: DC

## 2016-05-25 MED ORDER — LIDOCAINE HCL (PF) 1 % IJ SOLN
INTRAMUSCULAR | Status: AC
  Administered 2016-05-25: 14:00:00
  Filled 2016-05-25: qty 5

## 2016-05-25 MED ORDER — LIDOCAINE HCL (PF) 1 % IJ SOLN
5.0000 mL | Freq: Once | INTRAMUSCULAR | Status: AC
  Administered 2016-05-25: 5 mL

## 2016-05-25 MED ORDER — SULFAMETHOXAZOLE-TRIMETHOPRIM 800-160 MG PO TABS
1.0000 | ORAL_TABLET | Freq: Once | ORAL | Status: AC
  Administered 2016-05-25: 1 via ORAL
  Filled 2016-05-25: qty 1

## 2016-05-25 MED ORDER — BACITRACIN ZINC 500 UNIT/GM EX OINT
TOPICAL_OINTMENT | Freq: Once | CUTANEOUS | Status: AC
  Administered 2016-05-25: 14:00:00 via TOPICAL
  Filled 2016-05-25: qty 0.9

## 2016-05-25 NOTE — Discharge Instructions (Signed)
Take the antibiotic as directed. Apply warm compresses over the dressing to promote healing. [facility]. Return to the ED for wound check as needed.

## 2016-05-25 NOTE — ED Triage Notes (Signed)
Says abscess upper mid abd for abou 4 days   Says she tried to pop and only blood came out.

## 2016-05-25 NOTE — ED Notes (Signed)
Pt reports that she tried to pop the abscess and it started to bleed. Pt reports hx of the same but this is the worst.

## 2016-05-25 NOTE — ED Provider Notes (Signed)
[facility] Emergency Department Provider Note ____________________________________________  Time seen: 1328  I have reviewed the triage vital signs and the nursing notes.  HISTORY  Chief Complaint  Abscess  HPI Brittany Bates is a 41 y.o. female presents to the ED for evaluation of an infected cyst to the anterior chest. Patient amends to have a small bump formationto her chest, between her breasts, she tried to pop. She describes only noting blood coming out after she inserted a small pin she had heated. Since that time, 4 days ago, the areas become increasingly red, hot, tender, and tight. She denies any spontaneous drainage, fevers, chills, or sweats.  Past Medical History:  Diagnosis Date  . Bronchitis   . Diabetes mellitus without complication (HCC)     There are no active problems to display for this patient.   Past Surgical History:  Procedure Laterality Date  . Abcess removal      Prior to Admission medications   Medication Sig Start Date End Date Taking? Authorizing Provider  amoxicillin (AMOXIL) 500 MG capsule Take 1 capsule (500 mg total) by mouth 3 (three) times daily. 03/06/16   Tommi Rumps, PA-C  loratadine (CLARITIN) 10 MG tablet Take 10 mg by mouth daily.    Historical Provider, MD  naproxen (NAPROSYN) 500 MG tablet Take 1 tablet (500 mg total) by mouth 2 (two) times daily with a meal. 03/06/16   Tommi Rumps, PA-C  naproxen (NAPROSYN) 500 MG tablet Take 1 tablet (500 mg total) by mouth 2 (two) times daily with a meal. 03/24/16   Joni Reining, PA-C  promethazine (PHENERGAN) 25 MG tablet Take 1 tablet (25 mg total) by mouth every 6 (six) hours as needed for nausea or vomiting. 04/30/16   Minna Antis, MD  sulfamethoxazole-trimethoprim (BACTRIM DS,SEPTRA DS) 800-160 MG tablet Take 1 tablet by mouth 2 (two) times daily. 05/25/16   Algis Lehenbauer V Bacon Micheline Markes, PA-C  traMADol (ULTRAM) 50 MG tablet Take 1 tablet (50 mg total) by mouth  every 6 (six) hours as needed. 04/30/16   Minna Antis, MD    Allergies Latex and Morphine and related  No family history on file.  Social History Social History  Substance Use Topics  . Smoking status: Current Every Day Smoker    Packs/day: 0.50    Types: Cigarettes  . Smokeless tobacco: Never Used  . Alcohol use No    Review of Systems  Constitutional: Negative for fever. Cardiovascular: Negative for chest pain. Respiratory: Negative for shortness of breath. Musculoskeletal: Negative for back pain. Skin: Negative for rash. Chest wall abscess as above Neurological: Negative for headaches, focal weakness or numbness. ____________________________________________  PHYSICAL EXAM:  VITAL SIGNS: ED Triage Vitals  Enc Vitals Group     BP 05/25/16 1235 (!) 141/80     Pulse Rate 05/25/16 1235 (!) 126     Resp 05/25/16 1235 18     Temp 05/25/16 1235 98.4 F (36.9 C)     Temp Source 05/25/16 1235 Oral     SpO2 05/25/16 1235 97 %     Weight --      Height --      Head Circumference --      Peak Flow --      Pain Score 05/25/16 1228 9     Pain Loc --      Pain Edu? --      Excl. in GC? --     Constitutional: Alert and oriented. Well appearing and  in no distress. Head: Normocephalic and atraumatic. Cardiovascular: Normal rate, regular rhythm. Normal distal pulses. Respiratory: Normal respiratory effort. No wheezes/rales/rhonchi. Musculoskeletal: Nontender with normal range of motion in all extremities.  Skin:  Skin is warm, dry and intact. No rash noted. She decided to have a central erythematous, cystic lesion with a central scab noted. There is area of surrounding induration deep to the skin measuring an additional centimeter beyond the central papule. ____________________________________________  PROCEDURES  Bactrim DS i PO  INCISION AND DRAINAGE Authorized by: Lissa HoardMenshew, Loredana Medellin V Bacon  Performed by: Laqueta DueZach Schulz, PA-S Sherrie Sport([place]) Consent: Verbal consent  obtained. Risks and benefits: risks, benefits and alternatives were discussed Type: abscess  Body area: ant chest wall  Anesthesia: local infiltration  Incision was made with a scalpel.  Local anesthetic: lidocaine 1% w/o epinephrine  Anesthetic total: 3 ml  Complexity: complex Blunt dissection to break up loculations  Drainage: purulent  Drainage amount: moderate  Patient tolerance: Patient tolerated the procedure well with no immediate complications. ____________________________________________  INITIAL IMPRESSION / ASSESSMENT AND PLAN / ED COURSE  Patient with a infected sebaceous cyst to the anterior chest, status post I&D procedure. The wound was appropriately dressed following the procedure. The patient is discharged wound care instructions and advised to apply warm compresses to promote healing. She will dose Bactrim as directed for infection management. She should return to the ED for wound check as needed. [facility]. ____________________________________________  FINAL CLINICAL IMPRESSION(S) / ED DIAGNOSES  Final diagnoses:  Cellulitis of chest wall  Encounter for recheck of abscess following incision and drainage  Infected sebaceous cyst of skin      Lissa HoardJenise V Bacon Reed Dady, PA-C 05/25/16 1415    Emily FilbertJonathan E Williams, MD 05/25/16 1442

## 2016-05-25 NOTE — ED Notes (Signed)
Pt with small abscess to upper abdomen. Pt reports painful when clothes touch it, she lays down, moves, etc.

## 2016-05-25 NOTE — ED Notes (Signed)
Pt verbalizes understanding of discharge instructions, medications and follow up.  

## 2016-08-08 ENCOUNTER — Encounter: Payer: Self-pay | Admitting: Emergency Medicine

## 2016-08-08 ENCOUNTER — Emergency Department
Admission: EM | Admit: 2016-08-08 | Discharge: 2016-08-08 | Disposition: A | Discharge status: Home / Self Care | Payer: Self-pay | Attending: Emergency Medicine | Admitting: Emergency Medicine

## 2016-08-08 DIAGNOSIS — F17210 Nicotine dependence, cigarettes, uncomplicated: Secondary | ICD-10-CM | POA: Insufficient documentation

## 2016-08-08 DIAGNOSIS — K029 Dental caries, unspecified: Secondary | ICD-10-CM | POA: Insufficient documentation

## 2016-08-08 DIAGNOSIS — E119 Type 2 diabetes mellitus without complications: Secondary | ICD-10-CM | POA: Insufficient documentation

## 2016-08-08 MED ORDER — PENICILLIN V POTASSIUM 500 MG PO TABS: 500.0000 mg | ORAL_TABLET | Freq: Four times a day (QID) | ORAL | 0 refills | Status: DC
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
## 2016-08-08 NOTE — Discharge Instructions (Signed)
Begin taking Pen-Vee K 500 mg as directed for dental infection. He will need to see one of the dental clinics listed below. These clinics are on a sliding scale per your income. This makes it possible to see a dentist without your settlement money. He called any of the clinics listed below. Also [facility] takes walk-in patients which may be a opportunity.

## 2016-08-08 NOTE — ED Triage Notes (Signed)
Patient presents to the ED with right lower jaw dental pain.  Patient reports she has a broken tooth on that side.  Patient states, "I think I have an abscessed tooth."  Patient is in no obvious distress at this time.

## 2016-09-12 ENCOUNTER — Encounter: Payer: Self-pay | Admitting: Emergency Medicine

## 2016-09-12 ENCOUNTER — Emergency Department
Admission: EM | Admit: 2016-09-12 | Discharge: 2016-09-12 | Disposition: A | Discharge status: Home / Self Care | Payer: Self-pay | Attending: Emergency Medicine | Admitting: Emergency Medicine

## 2016-09-12 DIAGNOSIS — Z91040 Latex allergy status: Secondary | ICD-10-CM | POA: Insufficient documentation

## 2016-09-12 DIAGNOSIS — E119 Type 2 diabetes mellitus without complications: Secondary | ICD-10-CM | POA: Insufficient documentation

## 2016-09-12 DIAGNOSIS — F17210 Nicotine dependence, cigarettes, uncomplicated: Secondary | ICD-10-CM | POA: Insufficient documentation

## 2016-09-12 DIAGNOSIS — K029 Dental caries, unspecified: Secondary | ICD-10-CM | POA: Insufficient documentation

## 2016-09-12 DIAGNOSIS — K0889 Other specified disorders of teeth and supporting structures: Secondary | ICD-10-CM | POA: Insufficient documentation

## 2016-09-12 MED ORDER — PENICILLIN V POTASSIUM 500 MG PO TABS: 500.0000 mg | ORAL_TABLET | Freq: Four times a day (QID) | ORAL | 0 refills | Status: DC

## 2016-09-12 MED ORDER — TRAMADOL HCL 50 MG PO TABS: 50.0000 mg | ORAL_TABLET | Freq: Four times a day (QID) | ORAL | 0 refills | Status: DC | PRN

## 2016-09-12 MED ORDER — IBUPROFEN 600 MG PO TABS: 600.0000 mg | ORAL_TABLET | Freq: Four times a day (QID) | ORAL | 0 refills | Status: DC | PRN

## 2016-09-12 NOTE — ED Notes (Signed)
See triage note  States she feels like she has a dental abscess to right side  Pain started about 3-4 days ago

## 2016-09-12 NOTE — Discharge Instructions (Signed)
Advised to go to the walk-in dental clinic this morning. Payment plans are available.

## 2016-09-12 NOTE — ED Provider Notes (Signed)
[facility] Emergency Department Provider Note   ____________________________________________   First MD Initiated Contact with Patient 09/12/16 [phone]     (approximate)  I have reviewed the triage vital signs and the nursing notes.   HISTORY  Chief Complaint Dental Pain    HPI Brittany Bates is a 41 y.o. female . Patient complaining of dental pain for greater than 2 months. Patient seen at this facility last month for dental pain referred to clinic. Patient did not follow dental clinic because  She denied head to $200 co-pay. Advised patient that there is a dental clinics that will take payment plans.   Past Medical History:  Diagnosis Date  . Bronchitis   . Diabetes mellitus without complication (HCC)     There are no active problems to display for this patient.   Past Surgical History:  Procedure Laterality Date  . Abcess removal      Prior to Admission medications   Medication Sig Start Date End Date Taking? Authorizing Provider  ibuprofen (ADVIL,MOTRIN) 600 MG tablet Take 1 tablet (600 mg total) by mouth every 6 (six) hours as needed. 09/12/16   Joni Reining, PA-C  penicillin v potassium (VEETID) 500 MG tablet Take 1 tablet (500 mg total) by mouth 4 (four) times daily. 08/08/16   Tommi Rumps, PA-C  penicillin v potassium (VEETID) 500 MG tablet Take 1 tablet (500 mg total) by mouth 4 (four) times daily. 09/12/16   Joni Reining, PA-C  traMADol (ULTRAM) 50 MG tablet Take 1 tablet (50 mg total) by mouth every 6 (six) hours as needed for moderate pain. 09/12/16   Joni Reining, PA-C    Allergies Latex and Morphine and related  No family history on file.  Social History Social History  Substance Use Topics  . Smoking status: Current Every Day Smoker    Packs/day: 0.40    Types: Cigarettes  . Smokeless tobacco: Never Used  . Alcohol use No    Review of Systems  Constitutional: No fever/chills Eyes: No visual changes. ENT:  No sore throat.Dental pain Cardiovascular: Denies chest pain. Respiratory: Denies shortness of breath. Gastrointestinal: No abdominal pain.  No nausea, no vomiting.  No diarrhea.  No constipation. Genitourinary: Negative for dysuria. Musculoskeletal: Negative for back pain. Skin: Negative for rash. Neurological: Negative for headaches, focal weakness or numbness. Endocrine:diabetes ____________________________________________   PHYSICAL EXAM:  VITAL SIGNS: ED Triage Vitals  Enc Vitals Group     BP 09/12/16 0843 (!) 140/95     Pulse Rate 09/12/16 0843 88     Resp 09/12/16 0843 17     Temp 09/12/16 0843 97.8 F (36.6 C)     Temp Source 09/12/16 0843 Oral     SpO2 09/12/16 0843 98 %     Weight 09/12/16 0843 138 lb (62.6 kg)     Height 09/12/16 0843 5\' 6"  (1.676 m)     Head Circumference --      Peak Flow --      Pain Score 09/12/16 0842 8     Pain Loc --      Pain Edu? --      Excl. in GC? --     Constitutional: Alert and oriented. Well appearing and in no acute distress. Eyes: Conjunctivae are normal. PERRL. EOMI. Head: Atraumatic. Nose: No congestion/rhinnorhea. Mouth/Throat: Mucous membranes are moist.  Oropharynx non-erythematous.  Multiple caries edematous gingiva. Neck: No stridor.  No cervical spine tenderness to palpation. Hematological/Lymphatic/Immunilogical: No cervical lymphadenopathy.  Cardiovascular: Normal rate, regular rhythm. Grossly normal heart sounds.  Good peripheral circulation. Respiratory: Normal respiratory effort.  No retractions. Lungs CTAB. Gastrointestinal: Soft and nontender. No distention. No abdominal bruits. No CVA tenderness. Musculoskeletal: No lower extremity tenderness nor edema.  No joint effusions. Neurologic:  Normal speech and language. No gross focal neurologic deficits are appreciated. No gait instability. Skin:  Skin is warm, dry and intact. No rash noted. Psychiatric: Mood and affect are normal. Speech and behavior are  normal.  ____________________________________________   LABS (all labs ordered are listed, but only abnormal results are displayed)  Labs Reviewed - No data to display ____________________________________________  EKG   ____________________________________________  RADIOLOGY  No results found.  ____________________________________________   PROCEDURES  Procedure(s) performed: None  Procedures  Critical Care performed: No  ____________________________________________   INITIAL IMPRESSION / ASSESSMENT AND PLAN / ED COURSE  Pertinent labs & imaging results that were available during my care of the patient were reviewed by me and considered in my medical decision making (see chart for details).To m  Dental pain secondary  multiple devitalized teeth and gingivitis.  Called the [facility] and advised patient that they confirmed Payment plans are available. Patient given discharge Instructions and advised to leave today to follow-up with walk-in clinic.      ____________________________________________   FINAL CLINICAL IMPRESSION(S) / ED DIAGNOSES  Final diagnoses:  Pain due to dental caries      NEW MEDICATIONS STARTED DURING THIS VISIT:  New Prescriptions   IBUPROFEN (ADVIL,MOTRIN) 600 MG TABLET    Take 1 tablet (600 mg total) by mouth every 6 (six) hours as needed.   PENICILLIN V POTASSIUM (VEETID) 500 MG TABLET    Take 1 tablet (500 mg total) by mouth 4 (four) times daily.   TRAMADOL (ULTRAM) 50 MG TABLET    Take 1 tablet (50 mg total) by mouth every 6 (six) hours as needed for moderate pain.     Note:  This document was prepared using Dragon voice recognition software and may include unintentional dictation errors.    Joni ReiningSmith, Chani Ghanem K, PA-C 09/12/16 [phone]    Merrily Brittleifenbark, Neil, MD 09/12/16 [phone]

## 2016-09-12 NOTE — ED Triage Notes (Signed)
Pt reports dental abscess.  

## 2017-02-12 ENCOUNTER — Encounter: Payer: Self-pay | Admitting: Emergency Medicine

## 2017-02-12 ENCOUNTER — Emergency Department
Admission: EM | Admit: 2017-02-12 | Discharge: 2017-02-12 | Disposition: A | Discharge status: Home / Self Care | Payer: Self-pay | Attending: Emergency Medicine | Admitting: Emergency Medicine

## 2017-02-12 ENCOUNTER — Other Ambulatory Visit: Payer: Self-pay

## 2017-02-12 DIAGNOSIS — Z7984 Long term (current) use of oral hypoglycemic drugs: Secondary | ICD-10-CM | POA: Insufficient documentation

## 2017-02-12 DIAGNOSIS — F17210 Nicotine dependence, cigarettes, uncomplicated: Secondary | ICD-10-CM | POA: Insufficient documentation

## 2017-02-12 DIAGNOSIS — M65051 Abscess of tendon sheath, right thigh: Secondary | ICD-10-CM | POA: Insufficient documentation

## 2017-02-12 DIAGNOSIS — L02415 Cutaneous abscess of right lower limb: Secondary | ICD-10-CM

## 2017-02-12 DIAGNOSIS — E119 Type 2 diabetes mellitus without complications: Secondary | ICD-10-CM | POA: Insufficient documentation

## 2017-02-12 LAB — GLUCOSE, CAPILLARY: Glucose-Capillary: 241 mg/dL — ABNORMAL HIGH (ref 65–99)

## 2017-02-12 MED ORDER — SULFAMETHOXAZOLE-TRIMETHOPRIM 800-160 MG PO TABS: 1.0000 | ORAL_TABLET | Freq: Two times a day (BID) | ORAL | 0 refills | Status: DC

## 2017-02-12 MED ORDER — METFORMIN HCL 500 MG PO TABS: 500.0000 mg | ORAL_TABLET | Freq: Two times a day (BID) | ORAL | 1 refills | Status: AC

## 2017-02-12 MED ORDER — TRAMADOL HCL 50 MG PO TABS: 50.0000 mg | ORAL_TABLET | Freq: Four times a day (QID) | ORAL | 0 refills | Status: DC | PRN

## 2017-02-12 NOTE — ED Provider Notes (Signed)
[facility] Emergency Department Provider Note  ____________________________________________   First MD Initiated Contact with Patient 02/12/17 1024     (approximate)  I have reviewed the triage vital signs and the nursing notes.   HISTORY  Chief Complaint Abscess   HPI Brittany AriasSabrina C Shasteen is a 41 y.o. female is complaining of abscess to her right groin for one week. Patient states that pain increased last evening. Patient had history of abscesses in the past. She also relates that she was diagnosed with beinga diabetic possibly 5 years ago but has not taken any medication due to expenses. She also does not have a PCP. Patient is fasting this morning. She was pain as 6 out of 10.   Past Medical History:  Diagnosis Date  . Bronchitis   . Diabetes mellitus without complication (HCC)     There are no active problems to display for this patient.   Past Surgical History:  Procedure Laterality Date  . Abcess removal      Prior to Admission medications   Medication Sig Start Date End Date Taking? Authorizing Provider  metFORMIN (GLUCOPHAGE) 500 MG tablet Take 1 tablet (500 mg total) 2 (two) times daily with a meal by mouth. 02/12/17   Tommi RumpsSummers, Hobie Kohles L, PA-C  sulfamethoxazole-trimethoprim (BACTRIM DS,SEPTRA DS) 800-160 MG tablet Take 1 tablet 2 (two) times daily by mouth. 02/12/17   Tommi RumpsSummers, Conswella Bruney L, PA-C  traMADol (ULTRAM) 50 MG tablet Take 1 tablet (50 mg total) every 6 (six) hours as needed by mouth. 02/12/17 02/12/18  Tommi RumpsSummers, Kayli Beal L, PA-C    Allergies Latex and Morphine and related  History reviewed. No pertinent family history.  Social History Social History   Tobacco Use  . Smoking status: Current Every Day Smoker    Packs/day: 0.40    Types: Cigarettes  . Smokeless tobacco: Never Used  Substance Use Topics  . Alcohol use: No  . Drug use: No    Review of Systems Constitutional: No fever/chills Cardiovascular: Denies chest  pain. Respiratory: Denies shortness of breath. Gastrointestinal: No abdominal pain.  No nausea, no vomiting.  Genitourinary: Negative for dysuria. Skin: positive for abscess. Neurological: Negative for headaches, focal weakness or numbness. ____________________________________________   PHYSICAL EXAM:  VITAL SIGNS: ED Triage Vitals  Enc Vitals Group     BP 02/12/17 1009 (!) 143/77     Pulse Rate 02/12/17 1009 (!) 106     Resp 02/12/17 1009 13     Temp 02/12/17 1009 98.5 F (36.9 C)     Temp Source 02/12/17 1009 Oral     SpO2 02/12/17 1009 98 %     Weight 02/12/17 1009 125 lb (56.7 kg)     Height 02/12/17 1009 5\' 6"  (1.676 m)     Head Circumference --      Peak Flow --      Pain Score 02/12/17 1005 6     Pain Loc --      Pain Edu? --      Excl. in GC? --    Constitutional: Alert and oriented. Well appearing and in no acute distress. Eyes: Conjunctivae are normal.  Head: Atraumatic. Neck: No stridor.   Cardiovascular: Normal rate, regular rhythm. Grossly normal heart sounds.  Good peripheral circulation. Respiratory: Normal respiratory effort.  No retractions. Lungs CTAB. Musculoskeletal: Moves upper and lower extremities without difficulty. Normal gait was noted. Neurologic:  Normal speech and language. No gross focal neurologic deficits are appreciated.  Skin:  Skin is warm, dry  and intact. There is a 2 cm firm, tender and erythematous nodule to the right medial aspect of her thigh. No surrounding cellulitis is present. No drainage present. Psychiatric: Mood and affect are normal. Speech and behavior are normal.  ____________________________________________   LABS (all labs ordered are listed, but only abnormal results are displayed)  Labs Reviewed  GLUCOSE, CAPILLARY - Abnormal; Notable for the following components:      Result Value   Glucose-Capillary 241 (*)    All other components within normal limits  CBG MONITORING, ED    PROCEDURES  Procedure(s)  performed: None  Procedures  Critical Care performed: No  ____________________________________________   INITIAL IMPRESSION / ASSESSMENT AND PLAN / ED COURSE Patient's fasting blood sugar this morning was 241. Patient was made aware of this. She is willing to begin taking metformin again as [facility] We discussed an established with a PCP. [facility], and [facility]. Should begin taking Bactrim DS twice a day for 10 days and tramadol as needed for pain. Use of warm moist compresses frequently to the area and return to the emergency department if any severe worsening of her symptoms or area has not opened and drained on its on in the next 2 days.  ____________________________________________   FINAL CLINICAL IMPRESSION(S) / ED DIAGNOSES  Final diagnoses:  Abscess of right thigh     ED Discharge Orders        Ordered    sulfamethoxazole-trimethoprim (BACTRIM DS,SEPTRA DS) 800-160 MG tablet  2 times daily     02/12/17 1129    metFORMIN (GLUCOPHAGE) 500 MG tablet  2 times daily with meals     02/12/17 1129    traMADol (ULTRAM) 50 MG tablet  Every 6 hours PRN     02/12/17 1129       Note:  This document was prepared using Dragon voice recognition software and may include unintentional dictation errors.    Tommi RumpsSummers, Dayven Linsley L, PA-C 02/12/17 1145    Governor RooksLord, Rebecca, MD 02/12/17 [phone]

## 2017-02-12 NOTE — Discharge Instructions (Signed)
Begin taking Bactrim DS twice a day for 10 days. Tramadol as needed for pain. Use warm compresses to the area frequently today. Also begin taking metformin 500 mg twice a day. He will also need to establish primary care and 3 clinics are listed above for consideration. You may call these to see if you can get an appointment. Return to the emergency department if abscess have not opened and drained on its on at which time we will drain it.

## 2017-02-12 NOTE — ED Notes (Signed)
FIRST NURSE NOTE:  Abscess around bikini line/vaginal per pt, states it has been there for 1 week started hurting last night. Pt has hx of abscesses states " I have had them all over the place"

## 2017-02-12 NOTE — ED Triage Notes (Signed)
Pt states she has abscess to right groin area x 1 week. Hx of the same. Pain since last night.

## 2017-04-06 ENCOUNTER — Emergency Department
Admission: EM | Admit: 2017-04-06 | Discharge: 2017-04-06 | Disposition: A | Discharge status: Home / Self Care | Payer: Self-pay | Attending: Emergency Medicine | Admitting: Emergency Medicine

## 2017-04-06 ENCOUNTER — Other Ambulatory Visit: Payer: Self-pay

## 2017-04-06 DIAGNOSIS — G43009 Migraine without aura, not intractable, without status migrainosus: Secondary | ICD-10-CM

## 2017-04-06 DIAGNOSIS — R0981 Nasal congestion: Secondary | ICD-10-CM | POA: Insufficient documentation

## 2017-04-06 DIAGNOSIS — R05 Cough: Secondary | ICD-10-CM | POA: Insufficient documentation

## 2017-04-06 DIAGNOSIS — E119 Type 2 diabetes mellitus without complications: Secondary | ICD-10-CM | POA: Insufficient documentation

## 2017-04-06 DIAGNOSIS — Z7984 Long term (current) use of oral hypoglycemic drugs: Secondary | ICD-10-CM | POA: Insufficient documentation

## 2017-04-06 DIAGNOSIS — J00 Acute nasopharyngitis [common cold]: Secondary | ICD-10-CM

## 2017-04-06 DIAGNOSIS — Z91040 Latex allergy status: Secondary | ICD-10-CM | POA: Insufficient documentation

## 2017-04-06 DIAGNOSIS — F17210 Nicotine dependence, cigarettes, uncomplicated: Secondary | ICD-10-CM | POA: Insufficient documentation

## 2017-04-06 HISTORY — DX: Migraine, unspecified, not intractable, without status migrainosus: G43.909

## 2017-04-06 MED ORDER — CETIRIZINE HCL 10 MG PO TABS: 10.0000 mg | ORAL_TABLET | Freq: Every day | ORAL | 0 refills | Status: DC

## 2017-04-06 MED ORDER — DIPHENHYDRAMINE HCL 50 MG/ML IJ SOLN
50.0000 mg | Freq: Once | INTRAMUSCULAR | Status: AC
  Administered 2017-04-06: 50 mg via INTRAMUSCULAR
  Filled 2017-04-06: qty 1

## 2017-04-06 MED ORDER — KETOROLAC TROMETHAMINE 30 MG/ML IJ SOLN
30.0000 mg | Freq: Once | INTRAMUSCULAR | Status: AC
  Administered 2017-04-06: 30 mg via INTRAMUSCULAR
  Filled 2017-04-06: qty 1

## 2017-04-06 MED ORDER — PROMETHAZINE HCL 25 MG/ML IJ SOLN
25.0000 mg | Freq: Once | INTRAMUSCULAR | Status: AC
  Administered 2017-04-06: 25 mg via INTRAMUSCULAR
  Filled 2017-04-06: qty 1

## 2017-04-06 MED ORDER — FLUTICASONE PROPIONATE 50 MCG/ACT NA SUSP: 1.0000 | Freq: Two times a day (BID) | NASAL | 0 refills | Status: DC

## 2017-04-06 NOTE — ED Provider Notes (Signed)
[facility] Emergency Department Provider Note  ____________________________________________  Time seen: Approximately 4:23 PM  I have reviewed the triage vital signs and the nursing notes.   HISTORY  Chief Complaint Headache    HPI Brittany Bates is a 42 y.o. female who presents the emergency department complaining of right-sided migraine.  Patient reports that she has had some nasal congestion, coughing but no fevers or chills, sore throat, abdominal pain, nausea vomiting.  Patient reports that she has a history of migraines but her typical Excedrin medication does not improve her headache today.  No recent trauma.  Patient denies any symptoms different than her normal migraine.  Past Medical History:  Diagnosis Date  . Bronchitis   . Diabetes mellitus without complication (HCC)   . Migraine     There are no active problems to display for this patient.   Past Surgical History:  Procedure Laterality Date  . Abcess removal      Prior to Admission medications   Medication Sig Start Date End Date Taking? Authorizing Provider  cetirizine (ZYRTEC) 10 MG tablet Take 1 tablet (10 mg total) by mouth daily. 04/06/17   Cuthriell, Delorise Royals, PA-C  fluticasone (FLONASE) 50 MCG/ACT nasal spray Place 1 spray into both nostrils 2 (two) times daily. 04/06/17   Cuthriell, Delorise Royals, PA-C  metFORMIN (GLUCOPHAGE) 500 MG tablet Take 1 tablet (500 mg total) 2 (two) times daily with a meal by mouth. 02/12/17   Tommi Rumps, PA-C  sulfamethoxazole-trimethoprim (BACTRIM DS,SEPTRA DS) 800-160 MG tablet Take 1 tablet 2 (two) times daily by mouth. 02/12/17   Tommi Rumps, PA-C  traMADol (ULTRAM) 50 MG tablet Take 1 tablet (50 mg total) every 6 (six) hours as needed by mouth. 02/12/17 02/12/18  Tommi Rumps, PA-C    Allergies Latex and Morphine and related  History reviewed. No pertinent family history.  Social History Social History   Tobacco Use  .  Smoking status: Current Every Day Smoker    Packs/day: 0.40    Types: Cigarettes  . Smokeless tobacco: Never Used  Substance Use Topics  . Alcohol use: No  . Drug use: No     Review of Systems  Constitutional: No fever/chills Eyes: No visual changes. No discharge ENT: Positive for nasal congestion Cardiovascular: no chest pain. Respiratory: Positive cough. No SOB. Gastrointestinal: No abdominal pain.  No nausea, no vomiting.  No diarrhea.  No constipation. Musculoskeletal: Negative for musculoskeletal pain. Skin: Negative for rash, abrasions, lacerations, ecchymosis. Neurological: Positive for right-sided migraine but denies focal weakness or numbness. 10-point ROS otherwise negative.  ____________________________________________   PHYSICAL EXAM:  VITAL SIGNS: ED Triage Vitals  Enc Vitals Group     BP 04/06/17 1509 (!) 114/96     Pulse Rate 04/06/17 1509 (!) 110     Resp 04/06/17 1509 14     Temp 04/06/17 1509 98.4 F (36.9 C)     Temp Source 04/06/17 1509 Oral     SpO2 04/06/17 1509 98 %     Weight 04/06/17 1509 125 lb (56.7 kg)     Height 04/06/17 1509 5\' 6"  (1.676 m)     Head Circumference --      Peak Flow --      Pain Score 04/06/17 1514 7     Pain Loc --      Pain Edu? --      Excl. in GC? --      Constitutional: Alert and oriented. Well appearing and in  no acute distress. Eyes: Conjunctivae are normal. PERRL. EOMI. Head: Atraumatic. ENT:      Ears: EAC is unremarkable bilaterally.  TM on right is mildly bulging.      Nose: Mild clear congestion/rhinnorhea.  Turbinates are boggy.      Mouth/Throat: Mucous membranes are moist.  Oropharynx is nonerythematous and nonedematous.  Uvula is midline. Neck: No stridor.  Neck is supple full range of motion Hematological/Lymphatic/Immunilogical: No cervical lymphadenopathy. Cardiovascular: Normal rate, regular rhythm. Normal S1 and S2.  Good peripheral circulation. Respiratory: Normal respiratory effort without  tachypnea or retractions. Lungs CTAB. Good air entry to the bases with no decreased or absent breath sounds. Musculoskeletal: Full range of motion to all extremities. No gross deformities appreciated. Neurologic:  Normal speech and language. No gross focal neurologic deficits are appreciated.  Skin:  Skin is warm, dry and intact. No rash noted. Psychiatric: Mood and affect are normal. Speech and behavior are normal. Patient exhibits appropriate insight and judgement.   ____________________________________________   LABS (all labs ordered are listed, but only abnormal results are displayed)  Labs Reviewed - No data to display ____________________________________________  EKG   ____________________________________________  RADIOLOGY   No results found.  ____________________________________________    PROCEDURES  Procedure(s) performed:    Procedures    Medications  ketorolac (TORADOL) 30 MG/ML injection 30 mg (not administered)  promethazine (PHENERGAN) injection 25 mg (not administered)  diphenhydrAMINE (BENADRYL) injection 50 mg (not administered)     ____________________________________________   INITIAL IMPRESSION / ASSESSMENT AND PLAN / ED COURSE  Pertinent labs & imaging results that were available during my care of the patient were reviewed by me and considered in my medical decision making (see chart for details).  Review of the [place] CSRS was performed in accordance of the [facility] prior to dispensing any controlled drugs.     Patient's diagnosis is consistent with migraine with acute rhinitis.  Patient has a history of recurrent sinusitis and rhinitis.  Differential included migraine versus cluster headache versus head bleed versus sinusitis versus otitis media.  At this time, no indication of bacterial sinusitis.  However, symptoms of rhinitis have triggered another migraine.  Patient will be given migraine cocktail emergency department. Patient will be  discharged home with prescriptions for Flonase and Zyrtec for her rhinitis. Patient is to follow up with primary care as needed or otherwise directed. Patient is given ED precautions to return to the ED for any worsening or new symptoms.     ____________________________________________  FINAL CLINICAL IMPRESSION(S) / ED DIAGNOSES  Final diagnoses:  Migraine without aura and without status migrainosus, not intractable  Acute rhinitis      NEW MEDICATIONS STARTED DURING THIS VISIT:  ED Discharge Orders        Ordered    fluticasone (FLONASE) 50 MCG/ACT nasal spray  2 times daily     04/06/17 1635    cetirizine (ZYRTEC) 10 MG tablet  Daily     04/06/17 1635          This chart was dictated using voice recognition software/Dragon. Despite best efforts to proofread, errors can occur which can change the meaning. Any change was purely unintentional.    Racheal PatchesCuthriell, Jonathan D, PA-C 04/06/17 1639    Don PerkingVeronese, [place], MD 04/09/17 [phone]

## 2017-04-06 NOTE — ED Notes (Signed)
See triage note  Presents with headache  States headache started this am  Usually is able to take Excedrin  But this am did not help  No trauma,fever or n/v

## 2017-04-06 NOTE — ED Triage Notes (Signed)
Pt arrives to ED c/o headache R temporal, R eye, and R ear. States hx migraines. Denies blurred vision. Light sensitivity. "sometimes sound." pt is alert, oriented, ambulatory.

## 2017-05-30 ENCOUNTER — Emergency Department
Admission: EM | Admit: 2017-05-30 | Discharge: 2017-05-30 | Disposition: A | Discharge status: Home / Self Care | Payer: Self-pay | Attending: Emergency Medicine | Admitting: Emergency Medicine

## 2017-05-30 ENCOUNTER — Encounter: Payer: Self-pay | Admitting: Intensive Care

## 2017-05-30 DIAGNOSIS — G43009 Migraine without aura, not intractable, without status migrainosus: Secondary | ICD-10-CM | POA: Insufficient documentation

## 2017-05-30 DIAGNOSIS — Z79899 Other long term (current) drug therapy: Secondary | ICD-10-CM | POA: Insufficient documentation

## 2017-05-30 DIAGNOSIS — Z7984 Long term (current) use of oral hypoglycemic drugs: Secondary | ICD-10-CM | POA: Insufficient documentation

## 2017-05-30 DIAGNOSIS — J329 Chronic sinusitis, unspecified: Secondary | ICD-10-CM

## 2017-05-30 DIAGNOSIS — E119 Type 2 diabetes mellitus without complications: Secondary | ICD-10-CM | POA: Insufficient documentation

## 2017-05-30 DIAGNOSIS — Z91040 Latex allergy status: Secondary | ICD-10-CM | POA: Insufficient documentation

## 2017-05-30 DIAGNOSIS — F17210 Nicotine dependence, cigarettes, uncomplicated: Secondary | ICD-10-CM | POA: Insufficient documentation

## 2017-05-30 DIAGNOSIS — B9689 Other specified bacterial agents as the cause of diseases classified elsewhere: Secondary | ICD-10-CM | POA: Insufficient documentation

## 2017-05-30 DIAGNOSIS — J019 Acute sinusitis, unspecified: Secondary | ICD-10-CM | POA: Insufficient documentation

## 2017-05-30 MED ORDER — KETOROLAC TROMETHAMINE 30 MG/ML IJ SOLN
30.0000 mg | Freq: Once | INTRAMUSCULAR | Status: AC
  Administered 2017-05-30: 30 mg via INTRAMUSCULAR
  Filled 2017-05-30: qty 1

## 2017-05-30 MED ORDER — AMOXICILLIN-POT CLAVULANATE 875-125 MG PO TABS: 1.0000 | ORAL_TABLET | Freq: Two times a day (BID) | ORAL | 0 refills | Status: DC

## 2017-05-30 MED ORDER — PROMETHAZINE HCL 25 MG/ML IJ SOLN
25.0000 mg | Freq: Once | INTRAMUSCULAR | Status: AC
  Administered 2017-05-30: 25 mg via INTRAMUSCULAR
  Filled 2017-05-30: qty 1

## 2017-05-30 MED ORDER — DIPHENHYDRAMINE HCL 50 MG/ML IJ SOLN
50.0000 mg | Freq: Once | INTRAMUSCULAR | Status: AC
  Administered 2017-05-30: 50 mg via INTRAMUSCULAR
  Filled 2017-05-30: qty 1

## 2017-05-30 NOTE — ED Triage Notes (Signed)
Patient c/o cold X1 week. Also reports nausea and headache today. Ambulatory with no problems in triage. No respiratory distress noted

## 2017-05-30 NOTE — ED Provider Notes (Signed)
[facility] Emergency Department Provider Note  ____________________________________________  Time seen: Approximately 3:50 PM  I have reviewed the triage vital signs and the nursing notes.   HISTORY  Chief Complaint URI    HPI Brittany BADDERS is a 42 y.o. female who presents emergency department complaining of a week of increasing nasal congestion, sinus pressure and presents with a headache today.  Patient reports that she has a history of chronic sinusitis and takes Flonase and Zyrtec on a daily basis.  Over the last week she has had sinus pressure.  Patient also has a history of migraine headaches.  Patient reports that headache is similar to her previous migraine.  No aura or visual changes.  Patient denies any fevers or chills, sore throat, difficulty breathing, chest pain, shortness of breath, abdominal pain, nausea or vomiting.  Patient has taken 1 Excedrin this morning and continues to take her allergy medications.  No other medications for this complaint prior to arrival.  No other complaints at this time.  Past Medical History:  Diagnosis Date  . Bronchitis   . Diabetes mellitus without complication (HCC)   . Migraine     There are no active problems to display for this patient.   Past Surgical History:  Procedure Laterality Date  . Abcess removal      Prior to Admission medications   Medication Sig Start Date End Date Taking? Authorizing Provider  amoxicillin-clavulanate (AUGMENTIN) 875-125 MG tablet Take 1 tablet by mouth 2 (two) times daily. 05/30/17   Cordney Barstow, Delorise Royals, PA-C  cetirizine (ZYRTEC) 10 MG tablet Take 1 tablet (10 mg total) by mouth daily. 04/06/17   Keygan Dumond, Delorise Royals, PA-C  fluticasone (FLONASE) 50 MCG/ACT nasal spray Place 1 spray into both nostrils 2 (two) times daily. 04/06/17   Erven Ramson, Delorise Royals, PA-C  metFORMIN (GLUCOPHAGE) 500 MG tablet Take 1 tablet (500 mg total) 2 (two) times daily with a meal by mouth. 02/12/17    Tommi Rumps, PA-C  sulfamethoxazole-trimethoprim (BACTRIM DS,SEPTRA DS) 800-160 MG tablet Take 1 tablet 2 (two) times daily by mouth. 02/12/17   Tommi Rumps, PA-C  traMADol (ULTRAM) 50 MG tablet Take 1 tablet (50 mg total) every 6 (six) hours as needed by mouth. 02/12/17 02/12/18  Tommi Rumps, PA-C    Allergies Latex and Morphine and related  History reviewed. No pertinent family history.  Social History Social History   Tobacco Use  . Smoking status: Current Every Day Smoker    Packs/day: 0.50    Types: Cigarettes  . Smokeless tobacco: Never Used  Substance Use Topics  . Alcohol use: No  . Drug use: No     Review of Systems  Constitutional: No fever/chills Eyes: No visual changes. No discharge ENT: Positive for nasal congestion and sinus pressure Cardiovascular: no chest pain. Respiratory: no cough. No SOB. Gastrointestinal: No abdominal pain.  No nausea, no vomiting.   Musculoskeletal: Negative for musculoskeletal pain. Skin: Negative for rash, abrasions, lacerations, ecchymosis. Neurological: Positive for migraine headache but denies focal weakness or numbness. 10-point ROS otherwise negative.  ____________________________________________   PHYSICAL EXAM:  VITAL SIGNS: ED Triage Vitals  Enc Vitals Group     BP 05/30/17 1518 (!) 154/89     Pulse Rate 05/30/17 1518 (!) 103     Resp 05/30/17 1518 16     Temp 05/30/17 1518 98.1 F (36.7 C)     Temp Source 05/30/17 1518 Oral     SpO2 05/30/17 1518 100 %  Weight 05/30/17 1519 125 lb (56.7 kg)     Height 05/30/17 1519 5\' 6"  (1.676 m)     Head Circumference --      Peak Flow --      Pain Score 05/30/17 1519 7     Pain Loc --      Pain Edu? --      Excl. in GC? --      Constitutional: Alert and oriented. Well appearing and in no acute distress. Eyes: Conjunctivae are normal. PERRL. EOMI. Head: Atraumatic. ENT:      Ears: EACs unremarkable bilaterally.  TMs are mildly bulging  bilaterally.      Nose: Moderate purulent Congestion/rhinnorhea.  Turbinates are erythematous and edematous.  Patient is tender to percussion over the ethmoid and maxillary sinuses.      Mouth/Throat: Mucous membranes are moist.  Oropharynx is nonerythematous and nonedematous.  Tonsils are unremarkable bilaterally.  Uvula is midline. Neck: No stridor.  Supple full range of motion Hematological/Lymphatic/Immunilogical: Scattered, nontender anterior cervical lymphadenopathy. Cardiovascular: Normal rate, regular rhythm. Normal S1 and S2.  Good peripheral circulation. Respiratory: Normal respiratory effort without tachypnea or retractions. Lungs CTAB. Good air entry to the bases with no decreased or absent breath sounds. Musculoskeletal: Full range of motion to all extremities. No gross deformities appreciated. Neurologic:  Normal speech and language. No gross focal neurologic deficits are appreciated.  Cranial nerves II through XII grossly intact.  Negative Romberg's and pronator drift. Skin:  Skin is warm, dry and intact. No rash noted. Psychiatric: Mood and affect are normal. Speech and behavior are normal. Patient exhibits appropriate insight and judgement.   ____________________________________________   LABS (all labs ordered are listed, but only abnormal results are displayed)  Labs Reviewed - No data to display ____________________________________________  EKG   ____________________________________________  RADIOLOGY   No results found.  ____________________________________________    PROCEDURES  Procedure(s) performed:    Procedures    Medications  promethazine (PHENERGAN) injection 25 mg (25 mg Intramuscular Given 05/30/17 1559)  ketorolac (TORADOL) 30 MG/ML injection 30 mg (30 mg Intramuscular Given 05/30/17 1600)  diphenhydrAMINE (BENADRYL) injection 50 mg (50 mg Intramuscular Given 05/30/17 1600)     ____________________________________________   INITIAL  IMPRESSION / ASSESSMENT AND PLAN / ED COURSE  Pertinent labs & imaging results that were available during my care of the patient were reviewed by me and considered in my medical decision making (see chart for details).  Review of the [place] CSRS was performed in accordance of the [facility] prior to dispensing any controlled drugs.     Patient's diagnosis is consistent with acute sinusitis with accompanying migraine.  Patient has had increasing sinus complaints over the past week.  Differential included URI, influenza, sinus infection, migraine headache.  Symptoms are consistent with bacterial sinusitis with accompanying migraine.  Patient is to receive migraine cocktail emergency department.. Patient will be discharged home with prescriptions for Augmentin for sinus infection. Patient is to follow up with primary care as needed or otherwise directed. Patient is given ED precautions to return to the ED for any worsening or new symptoms.     ____________________________________________  FINAL CLINICAL IMPRESSION(S) / ED DIAGNOSES  Final diagnoses:  Bacterial sinusitis  Migraine without aura and without status migrainosus, not intractable      NEW MEDICATIONS STARTED DURING THIS VISIT:  ED Discharge Orders        Ordered    amoxicillin-clavulanate (AUGMENTIN) 875-125 MG tablet  2 times daily     05/30/17 1556  This chart was dictated using voice recognition software/Dragon. Despite best efforts to proofread, errors can occur which can change the meaning. Any change was purely unintentional.    Racheal Patches, PA-C 05/30/17 1603    Don Perking [place], MD 05/30/17 2356

## 2017-05-30 NOTE — ED Notes (Signed)
See triage note  States she developed cold sx's about 1 week ago  Denies any fever  But states now she having headache  Afebrile on arrival

## 2017-06-26 ENCOUNTER — Emergency Department
Admission: EM | Admit: 2017-06-26 | Discharge: 2017-06-26 | Disposition: A | Discharge status: Home / Self Care | Payer: Self-pay | Attending: Emergency Medicine | Admitting: Emergency Medicine

## 2017-06-26 ENCOUNTER — Other Ambulatory Visit: Payer: Self-pay

## 2017-06-26 DIAGNOSIS — E119 Type 2 diabetes mellitus without complications: Secondary | ICD-10-CM | POA: Insufficient documentation

## 2017-06-26 DIAGNOSIS — Z79899 Other long term (current) drug therapy: Secondary | ICD-10-CM | POA: Insufficient documentation

## 2017-06-26 DIAGNOSIS — J069 Acute upper respiratory infection, unspecified: Secondary | ICD-10-CM | POA: Insufficient documentation

## 2017-06-26 DIAGNOSIS — F17210 Nicotine dependence, cigarettes, uncomplicated: Secondary | ICD-10-CM | POA: Insufficient documentation

## 2017-06-26 DIAGNOSIS — J029 Acute pharyngitis, unspecified: Secondary | ICD-10-CM

## 2017-06-26 DIAGNOSIS — Z7984 Long term (current) use of oral hypoglycemic drugs: Secondary | ICD-10-CM | POA: Insufficient documentation

## 2017-06-26 LAB — GROUP A STREP BY PCR: Group A Strep by PCR: NOT DETECTED

## 2017-06-26 MED ORDER — AZITHROMYCIN 250 MG PO TABS: ORAL_TABLET | ORAL | 0 refills | Status: DC

## 2017-06-26 NOTE — ED Notes (Signed)
Pt to the er for sore throat and fever. No distress noted at this time.

## 2017-06-26 NOTE — ED Provider Notes (Signed)
[facility] Emergency Department Provider Note  ____________________________________________   First MD Initiated Contact with Patient 06/26/17 2213     (approximate)  I have reviewed the triage vital signs and the nursing notes.   HISTORY  Chief Complaint Influenza    HPI Brittany Bates is a 42 y.o. female presents emergency department complaining of flulike symptoms for 4 days.  She states she has been taking over-the-counter DayQuil without any relief.  She complains of a sore throat.  She states her mother has the flu right now.  She denies any vomiting or diarrhea.  Past Medical History:  Diagnosis Date  . Bronchitis   . Diabetes mellitus without complication (HCC)   . Migraine     There are no active problems to display for this patient.   Past Surgical History:  Procedure Laterality Date  . Abcess removal      Prior to Admission medications   Medication Sig Start Date End Date Taking? Authorizing Provider  azithromycin (ZITHROMAX Z-PAK) 250 MG tablet 2 pills today then 1 pill a day for 4 days 06/26/17   Sherrie MustacheFisher, Roselyn BeringSusan W, PA-C  cetirizine (ZYRTEC) 10 MG tablet Take 1 tablet (10 mg total) by mouth daily. 04/06/17   Cuthriell, Delorise RoyalsJonathan D, PA-C  fluticasone (FLONASE) 50 MCG/ACT nasal spray Place 1 spray into both nostrils 2 (two) times daily. 04/06/17   Cuthriell, Delorise RoyalsJonathan D, PA-C  metFORMIN (GLUCOPHAGE) 500 MG tablet Take 1 tablet (500 mg total) 2 (two) times daily with a meal by mouth. 02/12/17   Tommi RumpsSummers, Rhonda L, PA-C  traMADol (ULTRAM) 50 MG tablet Take 1 tablet (50 mg total) every 6 (six) hours as needed by mouth. 02/12/17 02/12/18  Tommi RumpsSummers, Rhonda L, PA-C    Allergies Latex and Morphine and related  No family history on file.  Social History Social History   Tobacco Use  . Smoking status: Current Every Day Smoker    Packs/day: 0.50    Types: Cigarettes  . Smokeless tobacco: Never Used  Substance Use Topics  . Alcohol use: No  .  Drug use: No    Review of Systems  Constitutional: Positive fever/chills, positive headache Eyes: No visual changes. ENT: Positive sore throat.  Positive for runny nose and congestion Respiratory: Positive cough Gastrointestinal: Negative for vomiting and diarrhea.  She states she did have some loose stools Genitourinary: Negative for dysuria. Musculoskeletal: Negative for back pain. Skin: Negative for rash.    ____________________________________________   PHYSICAL EXAM:  VITAL SIGNS: ED Triage Vitals  Enc Vitals Group     BP 06/26/17 2147 (!) 146/80     Pulse Rate 06/26/17 2147 (!) 109     Resp 06/26/17 2147 18     Temp 06/26/17 2147 98.4 F (36.9 C)     Temp Source 06/26/17 2147 Oral     SpO2 06/26/17 2147 100 %     Weight 06/26/17 2146 125 lb (56.7 kg)     Height 06/26/17 2146 5\' 6"  (1.676 m)     Head Circumference --      Peak Flow --      Pain Score 06/26/17 2146 7     Pain Loc --      Pain Edu? --      Excl. in GC? --     Constitutional: Alert and oriented. Well appearing and in no acute distress. Eyes: Conjunctivae are normal.  Head: Atraumatic. Ears: TMs clear bilaterally Nose: No congestion/rhinnorhea. Mouth/Throat: Mucous membranes are moist.  Throat  is bright red Neck: Is supple, no lymphadenopathy is noted Cardiovascular: Normal rate, regular rhythm.  Heart sounds are normal Respiratory: Normal respiratory effort.  No retractions, lungs clear to auscultation GU: deferred Musculoskeletal: FROM all extremities, warm and well perfused Neurologic:  Normal speech and language.  Skin:  Skin is warm, dry and intact. No rash noted. Psychiatric: Mood and affect are normal. Speech and behavior are normal.  ____________________________________________   LABS (all labs ordered are listed, but only abnormal results are displayed)  Labs Reviewed  GROUP A STREP BY PCR    ____________________________________________   ____________________________________________  RADIOLOGY    ____________________________________________   PROCEDURES  Procedure(s) performed: No  Procedures    ____________________________________________   INITIAL IMPRESSION / ASSESSMENT AND PLAN / ED COURSE  Pertinent labs & imaging results that were available during my care of the patient were reviewed by me and considered in my medical decision making (see chart for details).  Patient is 42 year old female presents emergency department complaining of flulike symptoms.  She states his throat is really sore.  On physical exam the patient appears nontoxic.  Throat is bright red and swollen.  She has some nasal congestion.  Remainder the exam is benign  Strep test ordered ----------------------------------------- 11:49 PM on 06/26/2017 -----------------------------------------  Strep test is negative.  Test results were explained to the patient.  Told her most likely this is a flulike illness.  She was given a prescription for a Z-Pak to take if she is not improving in 2 days.  She states she understands will comply with instructions.  She was discharged in stable condition    As part of my medical decision making, I reviewed the following data within the electronic MEDICAL RECORD NUMBER Nursing notes reviewed and incorporated, Labs reviewed strep test is negative, Notes from prior ED visits and [place] Controlled Substance Database  ____________________________________________   FINAL CLINICAL IMPRESSION(S) / ED DIAGNOSES  Final diagnoses:  Acute pharyngitis, unspecified etiology  Acute URI      NEW MEDICATIONS STARTED DURING THIS VISIT:  Discharge Medication List as of 06/26/2017 11:26 PM    START taking these medications   Details  azithromycin (ZITHROMAX Z-PAK) 250 MG tablet 2 pills today then 1 pill a day for 4 days, Print         Note:  This document was  prepared using Dragon voice recognition software and may include unintentional dictation errors.    Faythe Ghee, PA-C 06/26/17 2350    Jeanmarie Plant, MD 07/07/17 [phone]

## 2017-06-26 NOTE — ED Triage Notes (Signed)
Pt arrives to ED via POV with c/o flu-like s/x's x4 days. Pt reports taking OTC Dayquil without relief. No c/o N/V/D, no CP or ABD pain. Pt denies fever, (+) rhinitis. Pt is A&O, in NAD; RR even, regular, and unlabored.

## 2017-06-26 NOTE — ED Notes (Signed)
Patient given ice water per request by this EDT.

## 2017-07-10 ENCOUNTER — Other Ambulatory Visit: Payer: Self-pay

## 2017-07-10 ENCOUNTER — Encounter: Payer: Self-pay | Admitting: Emergency Medicine

## 2017-07-10 ENCOUNTER — Emergency Department
Admission: EM | Admit: 2017-07-10 | Discharge: 2017-07-10 | Disposition: A | Discharge status: Home / Self Care | Payer: Self-pay | Attending: Emergency Medicine | Admitting: Emergency Medicine

## 2017-07-10 DIAGNOSIS — Z7984 Long term (current) use of oral hypoglycemic drugs: Secondary | ICD-10-CM | POA: Insufficient documentation

## 2017-07-10 DIAGNOSIS — E119 Type 2 diabetes mellitus without complications: Secondary | ICD-10-CM | POA: Insufficient documentation

## 2017-07-10 DIAGNOSIS — J029 Acute pharyngitis, unspecified: Secondary | ICD-10-CM | POA: Insufficient documentation

## 2017-07-10 DIAGNOSIS — Z79899 Other long term (current) drug therapy: Secondary | ICD-10-CM | POA: Insufficient documentation

## 2017-07-10 DIAGNOSIS — G43909 Migraine, unspecified, not intractable, without status migrainosus: Secondary | ICD-10-CM | POA: Insufficient documentation

## 2017-07-10 DIAGNOSIS — F17210 Nicotine dependence, cigarettes, uncomplicated: Secondary | ICD-10-CM | POA: Insufficient documentation

## 2017-07-10 LAB — GROUP A STREP BY PCR: GROUP A STREP BY PCR: NOT DETECTED

## 2017-07-10 MED ORDER — METOCLOPRAMIDE HCL 5 MG/ML IJ SOLN
10.0000 mg | Freq: Once | INTRAMUSCULAR | Status: AC
  Administered 2017-07-10: 10 mg via INTRAVENOUS

## 2017-07-10 MED ORDER — METOCLOPRAMIDE HCL 5 MG/ML IJ SOLN
INTRAMUSCULAR | Status: AC
  Filled 2017-07-10: qty 2

## 2017-07-10 MED ORDER — DIPHENHYDRAMINE HCL 50 MG/ML IJ SOLN
25.0000 mg | Freq: Once | INTRAMUSCULAR | Status: AC
  Administered 2017-07-10: 25 mg via INTRAVENOUS

## 2017-07-10 MED ORDER — DIPHENHYDRAMINE HCL 50 MG/ML IJ SOLN
INTRAMUSCULAR | Status: AC
  Filled 2017-07-10: qty 1

## 2017-07-10 NOTE — ED Notes (Signed)
Pt signed esignature.  D/c  inst to pt.  

## 2017-07-10 NOTE — ED Provider Notes (Signed)
[facility] Emergency Department Provider Note ____________________________________________   First MD Initiated Contact with Patient 07/10/17 2059     (approximate)  I have reviewed the triage vital signs and the nursing notes.   HISTORY  Chief Complaint Migraine    HPI Brittany Bates is a 42 y.o. female with past medical history as noted below who presents with headache, right-sided, gradual onset, constant today, and associated with sore throat and nasal congestion.  She states it feels similar to prior migraines but somewhat more severe.   Past Medical History:  Diagnosis Date  . Bronchitis   . Diabetes mellitus without complication (HCC)   . Migraine     There are no active problems to display for this patient.   Past Surgical History:  Procedure Laterality Date  . Abcess removal      Prior to Admission medications   Medication Sig Start Date End Date Taking? Authorizing Provider  azithromycin (ZITHROMAX Z-PAK) 250 MG tablet 2 pills today then 1 pill a day for 4 days 06/26/17   Sherrie MustacheFisher, Roselyn BeringSusan W, PA-C  cetirizine (ZYRTEC) 10 MG tablet Take 1 tablet (10 mg total) by mouth daily. 04/06/17   Cuthriell, Delorise RoyalsJonathan D, PA-C  fluticasone (FLONASE) 50 MCG/ACT nasal spray Place 1 spray into both nostrils 2 (two) times daily. 04/06/17   Cuthriell, Delorise RoyalsJonathan D, PA-C  metFORMIN (GLUCOPHAGE) 500 MG tablet Take 1 tablet (500 mg total) 2 (two) times daily with a meal by mouth. 02/12/17   Tommi RumpsSummers, Rhonda L, PA-C  traMADol (ULTRAM) 50 MG tablet Take 1 tablet (50 mg total) every 6 (six) hours as needed by mouth. 02/12/17 02/12/18  Tommi RumpsSummers, Rhonda L, PA-C    Allergies Latex and Morphine and related  No family history on file.  Social History Social History   Tobacco Use  . Smoking status: Current Every Day Smoker    Packs/day: 0.50    Types: Cigarettes  . Smokeless tobacco: Never Used  Substance Use Topics  . Alcohol use: No  . Drug use: No    Review  of Systems  Constitutional: No fever. Eyes: No redness. ENT: Positive for sore throat. Cardiovascular: Denies chest pain. Respiratory: Denies shortness of breath. Gastrointestinal: No vomiting.  Genitourinary: Negative for flank pain.  Musculoskeletal: Negative for back pain. Skin: Negative for rash. Neurological: Positive for headache.   ____________________________________________   PHYSICAL EXAM:  VITAL SIGNS: ED Triage Vitals [07/10/17 2003]  Enc Vitals Group     BP (!) 141/90     Pulse Rate 99     Resp 18     Temp 99.1 F (37.3 C)     Temp Source Oral     SpO2 99 %     Weight 125 lb (56.7 kg)     Height 5\' 6"  (1.676 m)     Head Circumference      Peak Flow      Pain Score 8     Pain Loc      Pain Edu?      Excl. in GC?     Constitutional: Alert and oriented. Well appearing and in no acute distress. Eyes: Conjunctivae are normal.  EOMI.  PERRLA. Head: Atraumatic. Nose: Right side nasal mucosa inflamed. Mouth/Throat: Mucous membranes are moist.  Oropharynx erythematous, but no exudates or significant swelling. Neck: Normal range of motion.  No meningeal signs. Cardiovascular:   Good peripheral circulation. Respiratory: Normal respiratory effort.   Gastrointestinal: No distention.  Musculoskeletal:Extremities warm and well perfused.  Neurologic: Motor and sensory intact in all extremities.  Cranial nerves III through XII intact.  Normal coordination and finger-to-nose.  Normal speech and language. No gross focal neurologic deficits are appreciated.  Skin:  Skin is warm and dry. No rash noted. Psychiatric: Mood and affect are normal. Speech and behavior are normal.  ____________________________________________   LABS (all labs ordered are listed, but only abnormal results are displayed)  Labs Reviewed  GROUP A STREP BY PCR    ____________________________________________  EKG   ____________________________________________  RADIOLOGY    ____________________________________________   PROCEDURES  Procedure(s) performed: No  Procedures  Critical Care performed: No ____________________________________________   INITIAL IMPRESSION / ASSESSMENT AND PLAN / ED COURSE  Pertinent labs & imaging results that were available during my care of the patient were reviewed by me and considered in my medical decision making (see chart for details).  42 year old female presents with right-sided headache which was gradual onset and lasting for most the day today, and similar to prior migraines.  Associated with nasal congestion and sore throat.  Presentation is consistent with migraine versus tension headache, likely related to URI.  Strep test is negative.  We will treat symptomatically for migraine and anticipate discharge home.    ----------------------------------------- 10:21 PM on 07/10/2017 -----------------------------------------  Patient reports significantly improved headache.  She feels well to go home.  Return precautions given, and she expresses understanding.  ____________________________________________   FINAL CLINICAL IMPRESSION(S) / ED DIAGNOSES  Final diagnoses:  Migraine without status migrainosus, not intractable, unspecified migraine type  Viral pharyngitis      NEW MEDICATIONS STARTED DURING THIS VISIT:  New Prescriptions   No medications on file     Note:  This document was prepared using Dragon voice recognition software and may include unintentional dictation errors.    Dionne Bucy, MD 07/10/17 2222

## 2017-07-10 NOTE — ED Triage Notes (Signed)
Patient ambulatory to triage with steady gait, without difficulty or distress noted; pt reports migraine to right temple and occipitut with no accomp symptoms; st hx of same; also c/o sore throat since this am

## 2017-07-10 NOTE — ED Notes (Signed)
Pt reports a headache for 1 day.  Taking excedrin without relief.  Pt also has a sorethroat.  Pt alert.  Speech clear.

## 2017-07-10 NOTE — Discharge Instructions (Addendum)
Return to the ER for new, worsening, or persistent severe headache, throat pain, difficulty swallowing, high fevers, or any other new or worsening symptoms that concern you.  You may take your Excedrin for the headache as well as ibuprofen or Tylenol for the throat pain.

## 2017-11-09 ENCOUNTER — Encounter: Payer: Self-pay | Admitting: Emergency Medicine

## 2017-11-09 ENCOUNTER — Emergency Department
Admission: EM | Admit: 2017-11-09 | Discharge: 2017-11-09 | Disposition: A | Discharge status: Home / Self Care | Payer: Self-pay | Attending: Emergency Medicine | Admitting: Emergency Medicine

## 2017-11-09 DIAGNOSIS — E119 Type 2 diabetes mellitus without complications: Secondary | ICD-10-CM | POA: Insufficient documentation

## 2017-11-09 DIAGNOSIS — K029 Dental caries, unspecified: Secondary | ICD-10-CM | POA: Insufficient documentation

## 2017-11-09 DIAGNOSIS — F17210 Nicotine dependence, cigarettes, uncomplicated: Secondary | ICD-10-CM | POA: Insufficient documentation

## 2017-11-09 DIAGNOSIS — K047 Periapical abscess without sinus: Secondary | ICD-10-CM | POA: Insufficient documentation

## 2017-11-09 DIAGNOSIS — Z91040 Latex allergy status: Secondary | ICD-10-CM | POA: Insufficient documentation

## 2017-11-09 DIAGNOSIS — Z7984 Long term (current) use of oral hypoglycemic drugs: Secondary | ICD-10-CM | POA: Insufficient documentation

## 2017-11-09 DIAGNOSIS — Z79899 Other long term (current) drug therapy: Secondary | ICD-10-CM | POA: Insufficient documentation

## 2017-11-09 MED ORDER — TRAMADOL HCL 50 MG PO TABS: 50.0000 mg | ORAL_TABLET | Freq: Four times a day (QID) | ORAL | 0 refills | Status: AC | PRN

## 2017-11-09 MED ORDER — NAPROXEN 500 MG PO TABS
500.0000 mg | ORAL_TABLET | Freq: Once | ORAL | Status: AC
  Administered 2017-11-09: 500 mg via ORAL
  Filled 2017-11-09: qty 1

## 2017-11-09 MED ORDER — LIDOCAINE VISCOUS HCL 2 % MT SOLN
15.0000 mL | Freq: Once | OROMUCOSAL | Status: AC
  Administered 2017-11-09: 15 mL via OROMUCOSAL
  Filled 2017-11-09: qty 15

## 2017-11-09 MED ORDER — AMOXICILLIN 500 MG PO CAPS: 500.0000 mg | ORAL_CAPSULE | Freq: Three times a day (TID) | ORAL | 0 refills | Status: DC

## 2017-11-09 MED ORDER — LIDOCAINE VISCOUS HCL 2 % MT SOLN: 5.0000 mL | Freq: Four times a day (QID) | OROMUCOSAL | 0 refills | Status: DC | PRN

## 2017-11-09 MED ORDER — IBUPROFEN 800 MG PO TABS: 800.0000 mg | ORAL_TABLET | Freq: Three times a day (TID) | ORAL | 0 refills | Status: DC | PRN

## 2017-11-09 NOTE — ED Provider Notes (Signed)
[facility] Emergency Department Provider Note   ____________________________________________   First MD Initiated Contact with Patient 11/09/17 0818     (approximate)  I have reviewed the triage vital signs and the nursing notes.   HISTORY  Chief Complaint Jaw Pain and Dental Pain    HPI Brittany Bates is a 42 y.o. female patient complain of dental pain and swelling to the right jaw for 2 days.  Patient has a history of multiple infected dental caries has not follow-up with dentist as directed.  Patient rates the pain as a 6/10.  Patient described the pain as "achy".  No palliative measure for complaint.  Past Medical History:  Diagnosis Date  . Bronchitis   . Diabetes mellitus without complication (HCC)   . Migraine     There are no active problems to display for this patient.   Past Surgical History:  Procedure Laterality Date  . Abcess removal      Prior to Admission medications   Medication Sig Start Date End Date Taking? Authorizing Provider  amoxicillin (AMOXIL) 500 MG capsule Take 1 capsule (500 mg total) by mouth 3 (three) times daily. 11/09/17   Joni ReiningSmith, Lisl Slingerland K, PA-C  azithromycin (ZITHROMAX Z-PAK) 250 MG tablet 2 pills today then 1 pill a day for 4 days 06/26/17   Faythe GheeFisher, Susan W, PA-C  cetirizine (ZYRTEC) 10 MG tablet Take 1 tablet (10 mg total) by mouth daily. 04/06/17   Cuthriell, Delorise RoyalsJonathan D, PA-C  fluticasone (FLONASE) 50 MCG/ACT nasal spray Place 1 spray into both nostrils 2 (two) times daily. 04/06/17   Cuthriell, Delorise RoyalsJonathan D, PA-C  ibuprofen (ADVIL,MOTRIN) 800 MG tablet Take 1 tablet (800 mg total) by mouth every 8 (eight) hours as needed for moderate pain. 11/09/17   Joni ReiningSmith, Dallis Czaja K, PA-C  lidocaine (XYLOCAINE) 2 % solution Use as directed 5 mLs in the mouth or throat every 6 (six) hours as needed for mouth pain. 11/09/17   Joni ReiningSmith, Shannan Garfinkel K, PA-C  metFORMIN (GLUCOPHAGE) 500 MG tablet Take 1 tablet (500 mg total) 2 (two) times daily with a  meal by mouth. 02/12/17   Tommi RumpsSummers, Rhonda L, PA-C  traMADol (ULTRAM) 50 MG tablet Take 1 tablet (50 mg total) by mouth every 6 (six) hours as needed. 11/09/17 11/09/18  Joni ReiningSmith, Naidelyn Parrella K, PA-C    Allergies Latex and Morphine and related  No family history on file.  Social History Social History   Tobacco Use  . Smoking status: Current Every Day Smoker    Packs/day: 0.50    Types: Cigarettes  . Smokeless tobacco: Never Used  Substance Use Topics  . Alcohol use: No  . Drug use: No    Review of Systems Constitutional: No fever/chills Eyes: No visual changes. ENT: No sore throat.  Dental pain Cardiovascular: Denies chest pain. Respiratory: Denies shortness of breath. Gastrointestinal: No abdominal pain.  No nausea, no vomiting.  No diarrhea.  No constipation. Genitourinary: Negative for dysuria. Musculoskeletal: Negative for back pain. Skin: Negative for rash. Neurological: Negative for headaches, focal weakness or numbness. Endocrine:Diabetes Allergic/Immunilogical: Latex and morphine ____________________________________________   PHYSICAL EXAM:  VITAL SIGNS: ED Triage Vitals  Enc Vitals Group     BP 11/09/17 0756 (!) 137/94     Pulse Rate 11/09/17 0756 100     Resp 11/09/17 0756 20     Temp 11/09/17 0756 98.3 F (36.8 C)     Temp Source 11/09/17 0756 Oral     SpO2 11/09/17 0756 97 %  Weight 11/09/17 0755 120 lb (54.4 kg)     Height 11/09/17 0755 5\' 6"  (1.676 m)     Head Circumference --      Peak Flow --      Pain Score 11/09/17 0755 6     Pain Loc --      Pain Edu? --      Excl. in GC? --     Constitutional: Alert and oriented. Well appearing and in no acute distress. Mouth/Throat: Mucous membranes are moist.  Oropharynx non-erythematous.  Multiple dental caries and edematous gingiva. Neck: No stridor. Hematological/Lymphatic/Immunilogical: No cervical lymphadenopathy. Cardiovascular: Normal rate, regular rhythm. Grossly normal heart sounds.  Good  peripheral circulation. Respiratory: Normal respiratory effort.  No retractions. Lungs CTAB. eurologic:  Normal speech and language. No gross focal neurologic deficits are appreciated. No gait instability. Skin:  Skin is warm, dry and intact. No rash noted. Psychiatric: Mood and affect are normal. Speech and behavior are normal.  ____________________________________________   LABS (all labs ordered are listed, but only abnormal results are displayed)  Labs Reviewed - No data to display ____________________________________________  EKG   ____________________________________________  RADIOLOGY  ED MD interpretation:    Official radiology report(s): No results found.  ____________________________________________   PROCEDURES  Procedure(s) performed: None  Procedures  Critical Care performed: No  ____________________________________________   INITIAL IMPRESSION / ASSESSMENT AND PLAN / ED COURSE  As part of my medical decision making, I reviewed the following data within the electronic MEDICAL RECORD NUMBER    Throat pain secondary to multiple devitalized teeth and gingivitis.  Patient given discharge care instructions and advised to follow-up [facility] dental clinic.  Take medication as directed.      ____________________________________________   FINAL CLINICAL IMPRESSION(S) / ED DIAGNOSES  Final diagnoses:  Infected dental caries     ED Discharge Orders         Ordered    traMADol (ULTRAM) 50 MG tablet  Every 6 hours PRN     11/09/17 0824    amoxicillin (AMOXIL) 500 MG capsule  3 times daily     11/09/17 0824    ibuprofen (ADVIL,MOTRIN) 800 MG tablet  Every 8 hours PRN     11/09/17 0824    lidocaine (XYLOCAINE) 2 % solution  Every 6 hours PRN     11/09/17 0826           Note:  This document was prepared using Dragon voice recognition software and may include unintentional dictation errors.    Joni Reining, PA-C 11/09/17 0830      Merrily Brittle, MD 11/09/17 2105

## 2017-11-09 NOTE — Discharge Instructions (Signed)
Pick up prescription from pharmacy and schedule appointment for dental evaluation.

## 2017-11-09 NOTE — ED Triage Notes (Signed)
Pt reports tooth that has been hurting for a  Bit and this am awoke with swelling to her right jaw.

## 2018-01-16 ENCOUNTER — Emergency Department
Admission: EM | Admit: 2018-01-16 | Discharge: 2018-01-16 | Disposition: A | Discharge status: Home / Self Care | Payer: Self-pay | Attending: Emergency Medicine | Admitting: Emergency Medicine

## 2018-01-16 ENCOUNTER — Encounter: Payer: Self-pay | Admitting: Emergency Medicine

## 2018-01-16 DIAGNOSIS — E119 Type 2 diabetes mellitus without complications: Secondary | ICD-10-CM | POA: Insufficient documentation

## 2018-01-16 DIAGNOSIS — H6981 Other specified disorders of Eustachian tube, right ear: Secondary | ICD-10-CM | POA: Insufficient documentation

## 2018-01-16 DIAGNOSIS — F17210 Nicotine dependence, cigarettes, uncomplicated: Secondary | ICD-10-CM | POA: Insufficient documentation

## 2018-01-16 DIAGNOSIS — J0100 Acute maxillary sinusitis, unspecified: Secondary | ICD-10-CM | POA: Insufficient documentation

## 2018-01-16 DIAGNOSIS — Z79899 Other long term (current) drug therapy: Secondary | ICD-10-CM | POA: Insufficient documentation

## 2018-01-16 DIAGNOSIS — Z7984 Long term (current) use of oral hypoglycemic drugs: Secondary | ICD-10-CM | POA: Insufficient documentation

## 2018-01-16 MED ORDER — FEXOFENADINE-PSEUDOEPHED ER 60-120 MG PO TB12: 1.0000 | ORAL_TABLET | Freq: Two times a day (BID) | ORAL | 0 refills | Status: DC

## 2018-01-16 MED ORDER — AMOXICILLIN 500 MG PO CAPS: 500.0000 mg | ORAL_CAPSULE | Freq: Three times a day (TID) | ORAL | 0 refills | Status: DC

## 2018-01-16 MED ORDER — TRAMADOL HCL 50 MG PO TABS: 50.0000 mg | ORAL_TABLET | Freq: Two times a day (BID) | ORAL | 0 refills | Status: DC | PRN

## 2018-01-16 NOTE — ED Triage Notes (Signed)
Pt reports has been sick with her sinuses all week and now has pain to her right ear.

## 2018-01-16 NOTE — ED Provider Notes (Signed)
[facility] Emergency Department Provider Note   ____________________________________________   First MD Initiated Contact with Patient 01/16/18 [phone]     (approximate)  I have reviewed the triage vital signs and the nursing notes.   HISTORY  Chief Complaint Otalgia    HPI Brittany Bates is a 42 y.o. female patient complain of sinus congestion for greater than a week.  Patient state in the last 2 days she has increased right ear pain.  Patient denies hearing loss or vertigo.  Patient describes the pain is "achy/pressure".  Patient rates the pain as 8/10.  No palliative measures for complaint.   Past Medical History:  Diagnosis Date  . Bronchitis   . Diabetes mellitus without complication (HCC)   . Migraine     There are no active problems to display for this patient.   Past Surgical History:  Procedure Laterality Date  . Abcess removal      Prior to Admission medications   Medication Sig Start Date End Date Taking? Authorizing Provider  amoxicillin (AMOXIL) 500 MG capsule Take 1 capsule (500 mg total) by mouth 3 (three) times daily. 11/09/17   Joni Reining, PA-C  amoxicillin (AMOXIL) 500 MG capsule Take 1 capsule (500 mg total) by mouth 3 (three) times daily. 01/16/18   Joni Reining, PA-C  azithromycin (ZITHROMAX Z-PAK) 250 MG tablet 2 pills today then 1 pill a day for 4 days 06/26/17   Sherrie Mustache Roselyn Bering, PA-C  cetirizine (ZYRTEC) 10 MG tablet Take 1 tablet (10 mg total) by mouth daily. 04/06/17   Cuthriell, Delorise Royals, PA-C  fexofenadine-pseudoephedrine (ALLEGRA-D) 60-120 MG 12 hr tablet Take 1 tablet by mouth 2 (two) times daily. 01/16/18   Joni Reining, PA-C  fluticasone (FLONASE) 50 MCG/ACT nasal spray Place 1 spray into both nostrils 2 (two) times daily. 04/06/17   Cuthriell, Delorise Royals, PA-C  ibuprofen (ADVIL,MOTRIN) 800 MG tablet Take 1 tablet (800 mg total) by mouth every 8 (eight) hours as needed for moderate pain. 11/09/17   Joni Reining, PA-C  lidocaine (XYLOCAINE) 2 % solution Use as directed 5 mLs in the mouth or throat every 6 (six) hours as needed for mouth pain. 11/09/17   Joni Reining, PA-C  metFORMIN (GLUCOPHAGE) 500 MG tablet Take 1 tablet (500 mg total) 2 (two) times daily with a meal by mouth. 02/12/17   Tommi Rumps, PA-C  traMADol (ULTRAM) 50 MG tablet Take 1 tablet (50 mg total) by mouth every 6 (six) hours as needed. 11/09/17 11/09/18  Joni Reining, PA-C  traMADol (ULTRAM) 50 MG tablet Take 1 tablet (50 mg total) by mouth every 12 (twelve) hours as needed. 01/16/18   Joni Reining, PA-C    Allergies Latex and Morphine and related  No family history on file.  Social History Social History   Tobacco Use  . Smoking status: Current Every Day Smoker    Packs/day: 0.50    Types: Cigarettes  . Smokeless tobacco: Never Used  Substance Use Topics  . Alcohol use: No  . Drug use: No    Review of Systems  Constitutional: No fever/chills Eyes: No visual changes. ENT: No sore throat. Cardiovascular: Denies chest pain. Respiratory: Denies shortness of breath. Gastrointestinal: No abdominal pain.  No nausea, no vomiting.  No diarrhea.  No constipation. Genitourinary: Negative for dysuria. Musculoskeletal: Negative for back pain. Skin: Negative for rash. Neurological: Negative for headaches, focal weakness or numbness. Endocrine:Diabetes Hematological/Lymphatic: Allergic/Immunilogical: Latex and morphine.  ____________________________________________   PHYSICAL EXAM:  VITAL SIGNS: ED Triage Vitals  Enc Vitals Group     BP 01/16/18 0954 (!) 132/93     Pulse Rate 01/16/18 0954 (!) 107     Resp 01/16/18 0954 20     Temp 01/16/18 0954 98.2 F (36.8 C)     Temp Source 01/16/18 0954 Oral     SpO2 01/16/18 0954 98 %     Weight 01/16/18 0955 125 lb (56.7 kg)     Height 01/16/18 0955 5\' 6"  (1.676 m)     Head Circumference --      Peak Flow --      Pain Score 01/16/18 0954 8     Pain Loc --        Pain Edu? --      Excl. in GC? --    Constitutional: Alert and oriented. Well appearing and in no acute distress. Nose: Bilateral maxillary guarding with edematous nasal turbinates.  Edematous right TM.   Mouth/Throat: Mucous membranes are moist.  Oropharynx non-erythematous.  Postnasal drainage. Hematological/Lymphatic/Immunilogical No cervical lymphadenopathy. Cardiovascular: Tachycardic, regular rhythm. Grossly normal heart sounds.  Good peripheral circulation. Respiratory: Normal respiratory effort.  No retractions. Lungs CTAB. Neurologic:  Normal speech and language. No gross focal neurologic deficits are appreciated. No gait instability. Skin:  Skin is warm, dry and intact. No rash noted. Psychiatric: Mood and affect are normal. Speech and behavior are normal.  ____________________________________________   LABS (all labs ordered are listed, but only abnormal results are displayed)  Labs Reviewed - No data to display ____________________________________________  EKG   ____________________________________________  RADIOLOGY  ED MD interpretation:    Official radiology report(s): No results found.  ____________________________________________   PROCEDURES  Procedure(s) performed: None  Procedures  Critical Care performed: No  ____________________________________________   INITIAL IMPRESSION / ASSESSMENT AND PLAN / ED COURSE  As part of my medical decision making, I reviewed the following data within the electronic MEDICAL RECORD NUMBER    Maxillary sinusitis right eustachian tube dysfunction.  Patient given discharge care instruction advised take medication as directed.  Patient [facility].      ____________________________________________   FINAL CLINICAL IMPRESSION(S) / ED DIAGNOSES  Final diagnoses:  Eustachian tube dysfunction, right  Subacute maxillary sinusitis     ED Discharge Orders          Ordered    amoxicillin (AMOXIL) 500 MG capsule  3 times daily     01/16/18 1023    fexofenadine-pseudoephedrine (ALLEGRA-D) 60-120 MG 12 hr tablet  2 times daily     01/16/18 1023    traMADol (ULTRAM) 50 MG tablet  Every 12 hours PRN     01/16/18 1023           Note:  This document was prepared using Dragon voice recognition software and may include unintentional dictation errors.    Joni Reining, PA-C 01/16/18 1028    Don Perking, [place], MD 01/17/18 [phone]

## 2018-01-16 NOTE — ED Notes (Signed)
See triage note  States she has been sick for couple of days  States now having pain to right ear  No fever

## 2018-11-16 IMAGING — DX DG ANKLE COMPLETE 3+V*L*
3 series · 3 of 3 positions shown · non-contrast
Comparison: 06/07/2012 left foot films.

CLINICAL DATA: 40-year-old female with left heel pain for 3 weeks.
No known injury. Initial encounter.

EXAM:
LEFT ANKLE COMPLETE - 3+ VIEW

[ankle ap]
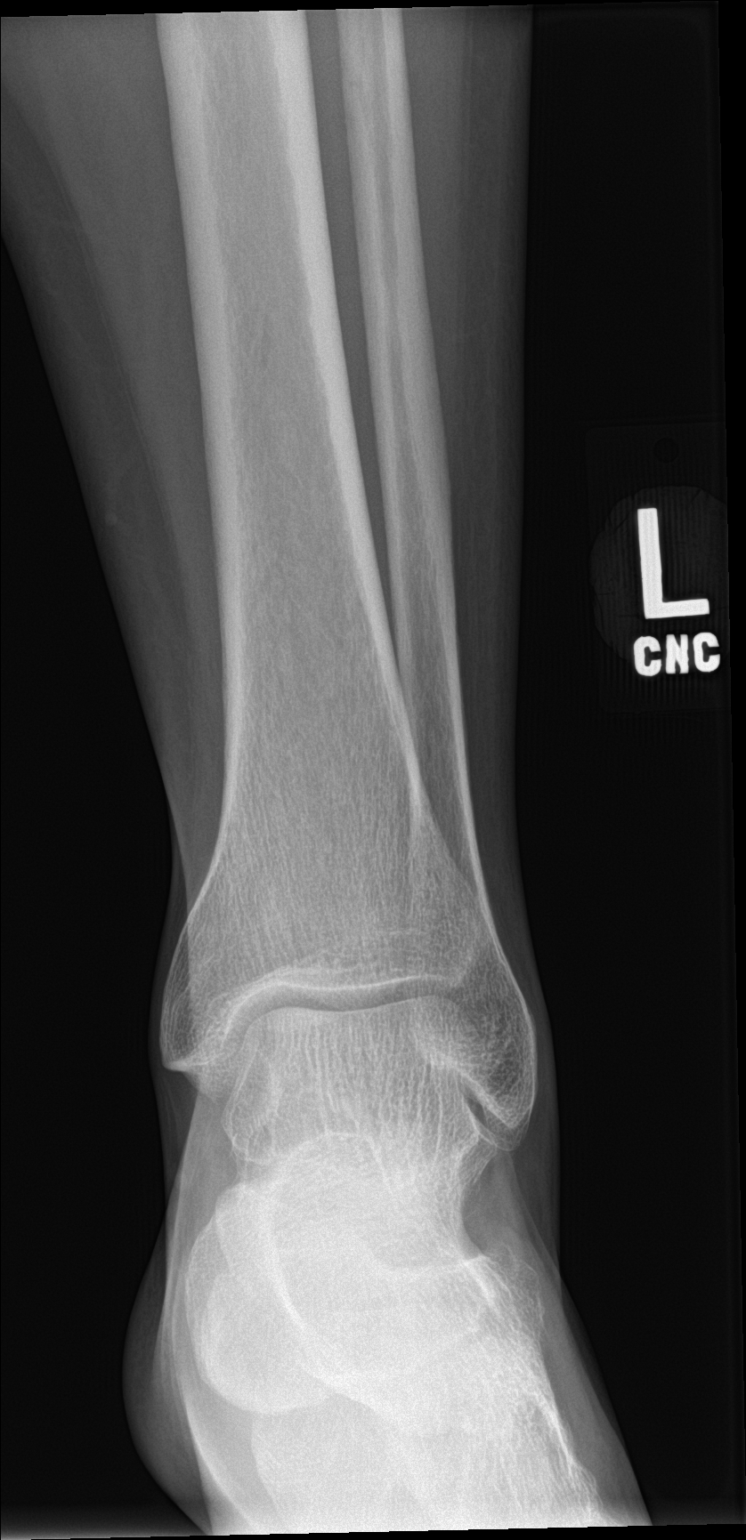

[ankle obl]
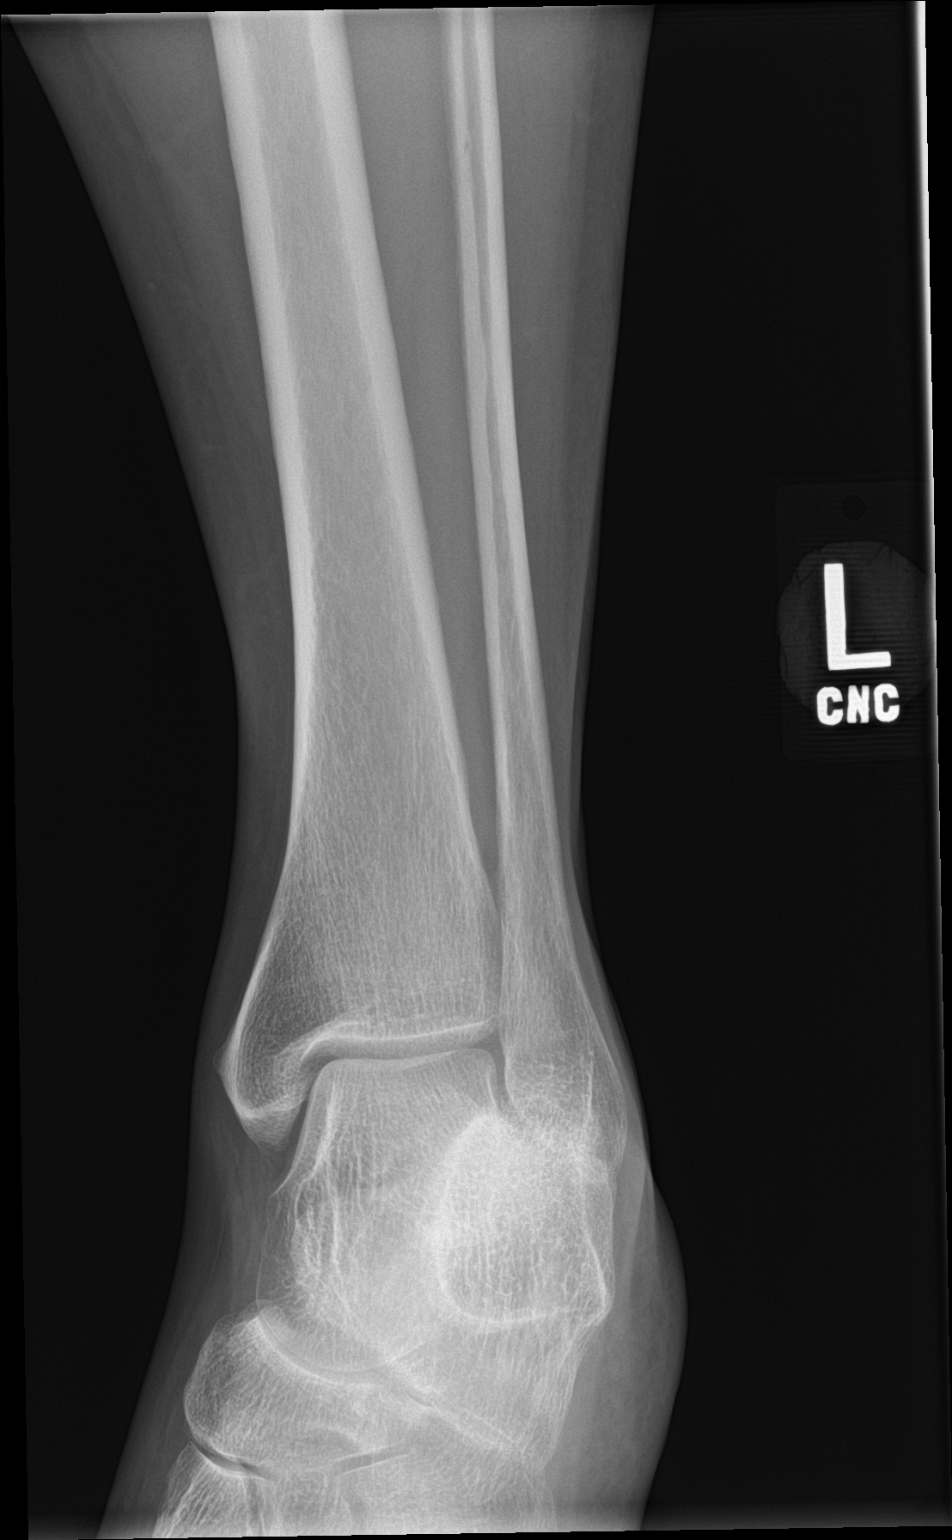

[ankle lat]
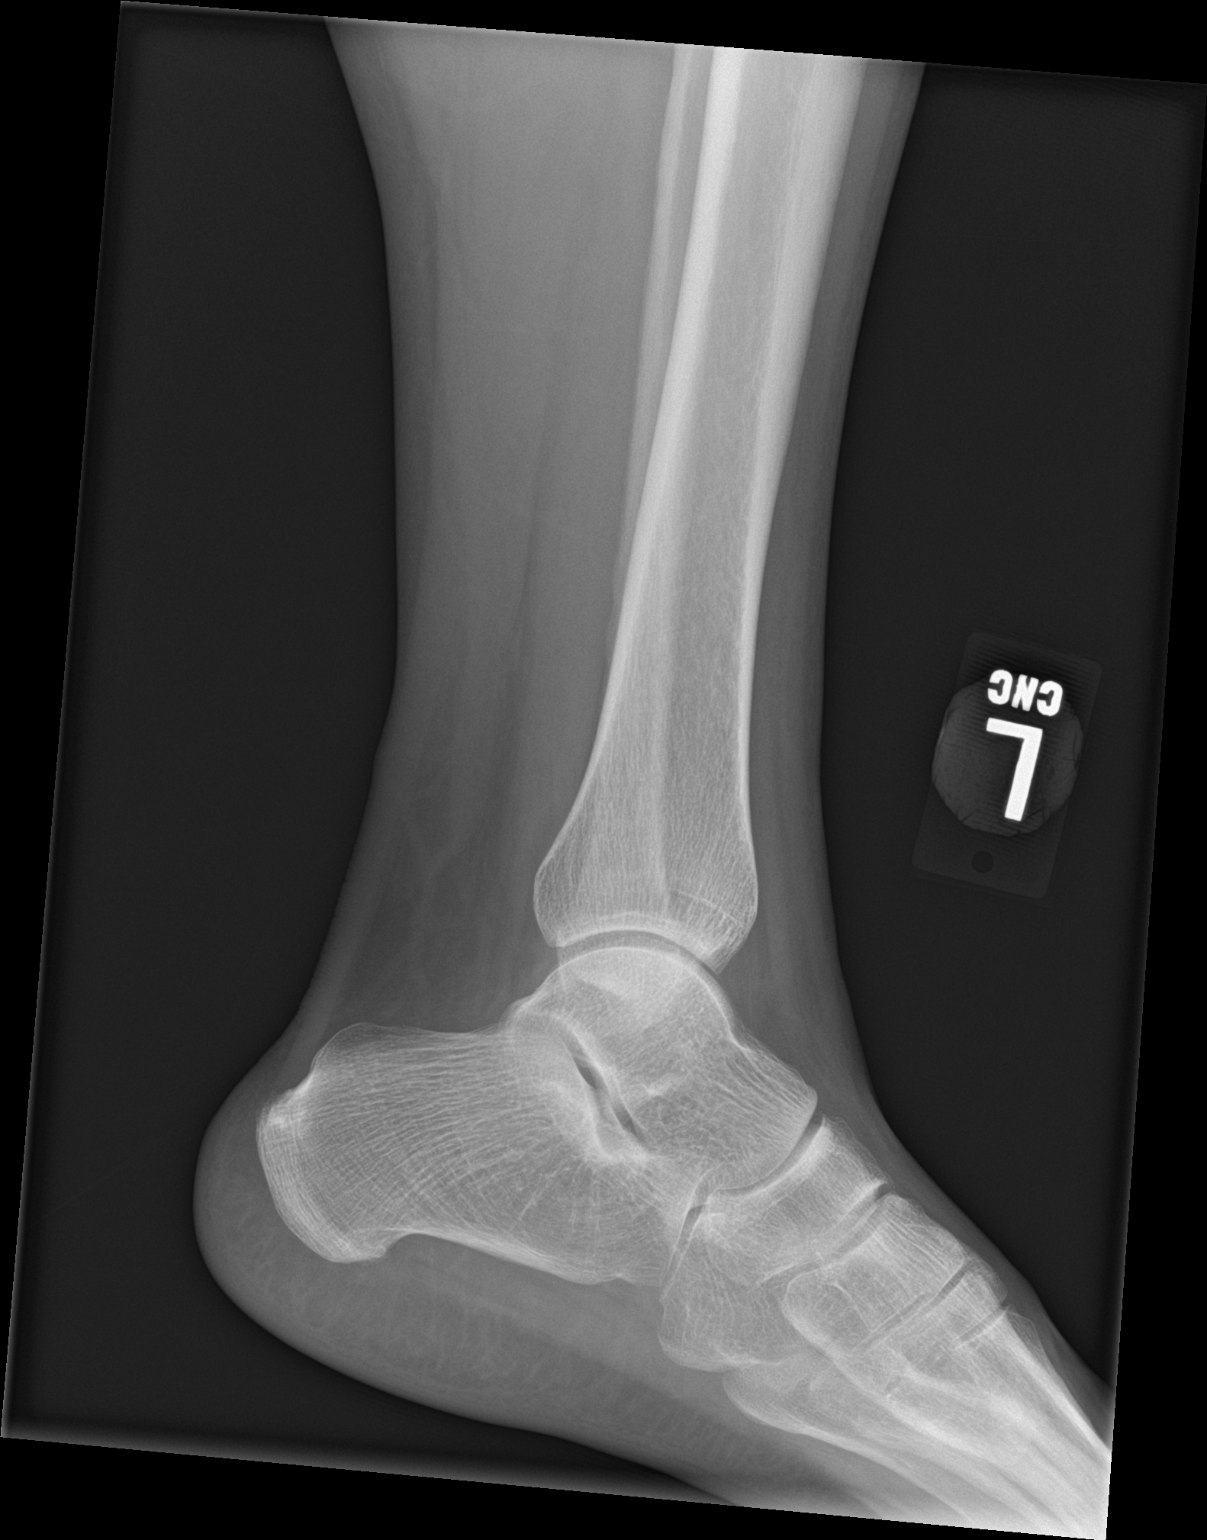

[3 of 3 positions shown; findings below may reference images not displayed]

FINDINGS: No fracture or dislocation.

Minimal spur plantar fascia insertion site.
IMPRESSION: Minimal spur plantar fascia insertion site.

## 2019-01-30 ENCOUNTER — Other Ambulatory Visit: Payer: Self-pay

## 2019-01-30 ENCOUNTER — Emergency Department: Payer: BLUE CROSS/BLUE SHIELD

## 2019-01-30 ENCOUNTER — Emergency Department
Admission: EM | Admit: 2019-01-30 | Discharge: 2019-01-30 | Disposition: A | Discharge status: Home / Self Care | Payer: BLUE CROSS/BLUE SHIELD | Attending: Emergency Medicine | Admitting: Emergency Medicine

## 2019-01-30 DIAGNOSIS — R0789 Other chest pain: Secondary | ICD-10-CM | POA: Insufficient documentation

## 2019-01-30 DIAGNOSIS — F17210 Nicotine dependence, cigarettes, uncomplicated: Secondary | ICD-10-CM | POA: Diagnosis not present

## 2019-01-30 DIAGNOSIS — Y999 Unspecified external cause status: Secondary | ICD-10-CM | POA: Insufficient documentation

## 2019-01-30 DIAGNOSIS — E119 Type 2 diabetes mellitus without complications: Secondary | ICD-10-CM | POA: Insufficient documentation

## 2019-01-30 DIAGNOSIS — Y93I9 Activity, other involving external motion: Secondary | ICD-10-CM | POA: Diagnosis not present

## 2019-01-30 DIAGNOSIS — S8002XA Contusion of left knee, initial encounter: Secondary | ICD-10-CM

## 2019-01-30 DIAGNOSIS — Z79899 Other long term (current) drug therapy: Secondary | ICD-10-CM | POA: Insufficient documentation

## 2019-01-30 DIAGNOSIS — Z91040 Latex allergy status: Secondary | ICD-10-CM | POA: Diagnosis not present

## 2019-01-30 DIAGNOSIS — Y92410 Unspecified street and highway as the place of occurrence of the external cause: Secondary | ICD-10-CM | POA: Insufficient documentation

## 2019-01-30 DIAGNOSIS — S199XXA Unspecified injury of neck, initial encounter: Secondary | ICD-10-CM | POA: Diagnosis present

## 2019-01-30 DIAGNOSIS — Z7984 Long term (current) use of oral hypoglycemic drugs: Secondary | ICD-10-CM | POA: Diagnosis not present

## 2019-01-30 DIAGNOSIS — M25511 Pain in right shoulder: Secondary | ICD-10-CM | POA: Diagnosis not present

## 2019-01-30 DIAGNOSIS — S161XXA Strain of muscle, fascia and tendon at neck level, initial encounter: Secondary | ICD-10-CM | POA: Insufficient documentation

## 2019-01-30 MED ORDER — NAPROXEN 500 MG PO TABS: 500.0000 mg | ORAL_TABLET | Freq: Two times a day (BID) | ORAL | 0 refills | Status: DC
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
## 2019-01-30 NOTE — Discharge Instructions (Addendum)
Follow-up with your primary care provider if any continued problems or concerns.  Begin taking medication for inflammation.  You can expect to be sore even with medication for approximately 4 to 5 days.  Be aware that you will be sore more tomorrow than you are currently.  You may use ice or heat to your back, neck and knee as needed for discomfort.  Be sure to take the anti-inflammatory with food to prevent stomach ache.

## 2019-01-30 NOTE — ED Notes (Signed)
Xray notified of c-collar being able to be removed for better xray.
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
## 2019-11-04 ENCOUNTER — Other Ambulatory Visit: Payer: Self-pay

## 2019-11-04 ENCOUNTER — Emergency Department
Admission: EM | Admit: 2019-11-04 | Discharge: 2019-11-04 | Disposition: A | Discharge status: Home / Self Care | Payer: BLUE CROSS/BLUE SHIELD | Source: Home / Self Care | Attending: Emergency Medicine | Admitting: Emergency Medicine

## 2019-11-04 ENCOUNTER — Encounter: Payer: Self-pay | Admitting: Emergency Medicine

## 2019-11-04 DIAGNOSIS — Z7984 Long term (current) use of oral hypoglycemic drugs: Secondary | ICD-10-CM | POA: Insufficient documentation

## 2019-11-04 DIAGNOSIS — L0231 Cutaneous abscess of buttock: Secondary | ICD-10-CM | POA: Insufficient documentation

## 2019-11-04 DIAGNOSIS — R000 Tachycardia, unspecified: Secondary | ICD-10-CM | POA: Insufficient documentation

## 2019-11-04 DIAGNOSIS — E119 Type 2 diabetes mellitus without complications: Secondary | ICD-10-CM | POA: Insufficient documentation

## 2019-11-04 DIAGNOSIS — L0291 Cutaneous abscess, unspecified: Secondary | ICD-10-CM

## 2019-11-04 DIAGNOSIS — E1110 Type 2 diabetes mellitus with ketoacidosis without coma: Secondary | ICD-10-CM | POA: Diagnosis not present

## 2019-11-04 DIAGNOSIS — Z91040 Latex allergy status: Secondary | ICD-10-CM | POA: Insufficient documentation

## 2019-11-04 DIAGNOSIS — F17210 Nicotine dependence, cigarettes, uncomplicated: Secondary | ICD-10-CM | POA: Insufficient documentation

## 2019-11-04 MED ORDER — LIDOCAINE-EPINEPHRINE 1 %-1:100000 IJ SOLN
20.0000 mL | Freq: Once | INTRAMUSCULAR | Status: AC
  Administered 2019-11-04: 20 mL
  Filled 2019-11-04: qty 1

## 2019-11-04 MED ORDER — KETOROLAC TROMETHAMINE 30 MG/ML IJ SOLN
30.0000 mg | Freq: Once | INTRAMUSCULAR | Status: AC
  Administered 2019-11-04: 30 mg via INTRAMUSCULAR
  Filled 2019-11-04: qty 1

## 2019-11-04 MED ORDER — DOXYCYCLINE HYCLATE 100 MG PO TABS
100.0000 mg | ORAL_TABLET | Freq: Once | ORAL | Status: AC
  Administered 2019-11-04: 100 mg via ORAL
  Filled 2019-11-04: qty 1

## 2019-11-04 MED ORDER — DOXYCYCLINE HYCLATE 100 MG PO TABS: 100.0000 mg | ORAL_TABLET | Freq: Two times a day (BID) | ORAL | 0 refills | Status: DC

## 2019-11-04 MED ORDER — ACETAMINOPHEN 500 MG PO TABS
1000.0000 mg | ORAL_TABLET | Freq: Once | ORAL | Status: AC
  Administered 2019-11-04: 1000 mg via ORAL
  Filled 2019-11-04: qty 2

## 2019-11-04 NOTE — Discharge Instructions (Signed)
You were seen in the ED because of abscess to left buttock area.  We gave you pain medicine, numbed the area and drained a small amount of pus. I was unable to get a significant amount of pus out, I believe that it is very small and early, and does not have a lot of pus to give.  I sent a prescription for doxycycline antibiotic to the [facility] down the road.  Please pick this prescription up today and take this medicine as prescribed.  Take twice daily for the next 7 days, and finish all 14 pills.  You already got 1 dose today, so take your second 1 this evening. While you are at the pharmacy, I would recommend picking up a doughnut but pillow to help relieve pressure from your buttocks as this heals.  Remember, I left that wound open to allow it to drain.  There may be a small amount of bleeding in your pants/underwear, and that is okay.  Try to squeeze pus out like as it because he wanted the pus out over there.  Please take Tylenol and ibuprofen/Advil for your pain.  It is safe to take them together, or to alternate them every few hours.  Take up to 1000mg  of Tylenol at a time, up to 4 times per day.  Do not take more than 4000 mg of Tylenol in 24 hours.  For ibuprofen, take 400-600 mg, 4-5 times per day.  If you develop any worsening symptoms, fevers, or inability to poop because of the pain please return to the ED.

## 2019-11-04 NOTE — ED Provider Notes (Signed)
[facility] Emergency Department Provider Note ____________________________________________   First MD Initiated Contact with Patient 11/04/19 1213     (approximate)  I have reviewed the triage vital signs and the nursing notes.  HISTORY  Chief Complaint Abscess   HPI Brittany Bates is a 44 y.o. female presented ED for evaluation of an abscess.  Chart review indicates history of DM on Metformin.  Patient ports 2-3 days of progressively worsening pain around her buttocks, just left of gluteal cleft.  She reports concern for abscess, because she has had one there previously with similar symptoms.  She reports 8/10 aching pain which she states on her bottom that is constant, relieved by position changes.  She reports difficulty sleeping due to have to stay on her side and the pain in her bottom.  She has not taken any pain medications to assist with his discomfort.  No recent antibiotics.  Patient denies any fevers, diarrhea or changes in her stool habits with this pain.  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  Past Medical History:  Diagnosis Date  . Bronchitis   . Diabetes mellitus without complication (HCC)   . Migraine     There are no problems to display for this patient.   Past Surgical History:  Procedure Laterality Date  . Abcess removal      Prior to Admission medications   Medication Sig Start Date End Date Taking? Authorizing Provider  cetirizine (ZYRTEC) 10 MG tablet Take 1 tablet (10 mg total) by mouth daily. 04/06/17   Cuthriell, Delorise Royals, PA-C  doxycycline (VIBRA-TABS) 100 MG tablet Take 1 tablet (100 mg total) by mouth 2 (two) times daily for 7 days. 11/04/19 11/11/19  Delton Prairie, MD  fluticasone (FLONASE) 50 MCG/ACT nasal spray Place 1 spray into both nostrils 2 (two) times daily. 04/06/17   Cuthriell, Delorise Royals, PA-C  metFORMIN (GLUCOPHAGE) 500 MG tablet Take 1 tablet (500 mg total) 2 (two) times daily with a meal by mouth. 02/12/17    Tommi Rumps, PA-C    Allergies Latex and Morphine and related  No family history on file.  Social History Social History   Tobacco Use  . Smoking status: Current Every Day Smoker    Packs/day: 0.50    Types: Cigarettes  . Smokeless tobacco: Never Used  Substance Use Topics  . Alcohol use: No  . Drug use: No    Review of Systems  Constitutional: No fever/chills Eyes: No visual changes. ENT: No sore throat. Cardiovascular: Denies chest pain. Respiratory: Denies shortness of breath. Gastrointestinal: No abdominal pain.  No nausea, no vomiting.  No diarrhea.  Positive for chronic constipation Positive for gluteal pain Genitourinary: Negative for dysuria. Musculoskeletal: Negative for back pain. Skin: Negative for rash. Neurological: Negative for headaches, focal weakness or numbness.   ____________________________________________   PHYSICAL EXAM:  VITAL SIGNS: Vitals:   11/04/19 1043  BP: (!) 131/78  Pulse: (!) 118  Resp: 18  Temp: 98.6 F (37 C)  SpO2: 99%      Constitutional: Alert and oriented.  Laying on her right side in no acute distress.  Uncomfortable-appearing.  Conversational full sentences. Eyes: Conjunctivae are normal. PERRL. EOMI. Head: Atraumatic. Nose: No congestion/rhinnorhea. Mouth/Throat: Mucous membranes are moist.  Oropharynx non-erythematous. Neck: No stridor. No cervical spine tenderness to palpation. Cardiovascular: Tachycardic and regular. Grossly normal heart sounds.  Good peripheral circulation. Respiratory: Normal respiratory effort.  No retractions. Lungs CTAB. Gastrointestinal: Soft , nondistended, nontender to palpation. No  abdominal bruits. No CVA tenderness. GU: Well-healed surgical incision to the superior tip of her gluteal cleft.  No overlying skin changes, erythema or signs of superficial abscess.  Rectal exam without tenderness or perirectal abscess. Induration and tenderness to palpation just left of midline  superior/posterior to her anus, extending about 4-5 cm towards her superior gluteal cleft surgical incision. No signs of midline pathology or right-sided pathology. Musculoskeletal: No lower extremity tenderness nor edema.  No joint effusions. No signs of acute trauma. Neurologic:  Normal speech and language. No gross focal neurologic deficits are appreciated. No gait instability noted. Skin:  Skin is warm, dry and intact. No rash noted. Psychiatric: Mood and affect are normal. Speech and behavior are normal.  ____________________________________________  ____________________________________________   PROCEDURES and INTERVENTIONS  Procedure(s) performed (including Critical Care):  Marland KitchenMarland KitchenIncision and Drainage  Date/Time: 11/04/2019 1:46 PM Performed by: Delton Prairie, MD Authorized by: Delton Prairie, MD   Consent:    Consent obtained:  Verbal   Consent given by:  Patient and spouse   Risks discussed:  Bleeding, incomplete drainage and pain   Alternatives discussed:  No treatment Location:    Type:  Abscess   Location:  Anogenital   Anogenital location:  Gluteal cleft Pre-procedure details:    Procedure prep: alcohol swabs. Sedation:    Sedation type:  Anxiolysis Anesthesia (see MAR for exact dosages):    Anesthesia method:  Local infiltration   Local anesthetic:  Lidocaine 1% WITH epi Procedure type:    Complexity:  Simple Procedure details:    Incision types:  Cruciate   Incision depth:  Submucosal   Scalpel blade:  15   Drainage:  Bloody   Drainage amount:  Scant   Wound treatment:  Wound left open   Packing materials:  None Post-procedure details:    Patient tolerance of procedure:  Tolerated well, no immediate complications    Medications  lidocaine-EPINEPHrine (XYLOCAINE W/EPI) 1 %-1:100000 (with pres) injection 20 mL (20 mLs Infiltration Given 11/04/19 1236)  acetaminophen (TYLENOL) tablet 1,000 mg (1,000 mg Oral Given 11/04/19 1236)  doxycycline (VIBRA-TABS) tablet  100 mg (100 mg Oral Given 11/04/19 1237)  ketorolac (TORADOL) 30 MG/ML injection 30 mg (30 mg Intramuscular Given 11/04/19 1237)    ____________________________________________   INITIAL IMPRESSION / ASSESSMENT AND PLAN / ED COURSE  44 year old woman with a history of diabetes on Metformin presenting with small left gluteal cleft abscess amenable to bedside drainage and outpatient management with antibiotics and PCP follow-up.  Initially tachycardic due to pain, resolving after analgesia and drainage.  Exam with isolated area of induration along her left buttocks/gluteal cleft posterior/superior to her anogenital region, and not extending to the perianal area.  Provided p.o. analgesia, local filtration of lidocaine, as above.  Scant amount of purulent material was encountered.  No additional areas of fluctuance to suggest loculated areas requiring additional drainage sites.  Wound left open to allow for drainage.  Started course of doxycycline antibiotics and advised PCP follow-up in the next 5 days.  Return precautions for the ED were discussed.  Medically stable discharge home.   ____________________________________________   FINAL CLINICAL IMPRESSION(S) / ED DIAGNOSES  Final diagnoses:  Abscess  Abscess of buttock, left  Sinus tachycardia     ED Discharge Orders         Ordered    doxycycline (VIBRA-TABS) 100 MG tablet  2 times daily     Discontinue  Reprint     11/04/19 1344  Delton Prairie   Note:  This document was prepared using Conservation officer, historic buildings and may include unintentional dictation errors.   Delton Prairie, MD 11/04/19 [phone]

## 2019-11-04 NOTE — ED Notes (Signed)
See triage note  Presents with possible abscess area to buttocks States she developed pain about 3 days ago  Hx of same in past

## 2019-11-04 NOTE — ED Triage Notes (Signed)
Pt reports an abscess to her buttocks for 3 days. Pt reports hx of the same and reports it is painful to sit on.

## 2019-11-05 ENCOUNTER — Inpatient Hospital Stay
Admission: EM | Admit: 2019-11-05 | Discharge: 2019-11-09 | DRG: 987 | Disposition: A | Discharge status: Home / Self Care | Payer: BLUE CROSS/BLUE SHIELD | Attending: Internal Medicine | Admitting: Internal Medicine

## 2019-11-05 ENCOUNTER — Encounter: Payer: Self-pay | Admitting: *Deleted

## 2019-11-05 ENCOUNTER — Emergency Department: Payer: BLUE CROSS/BLUE SHIELD

## 2019-11-05 ENCOUNTER — Other Ambulatory Visit: Payer: Self-pay

## 2019-11-05 DIAGNOSIS — G479 Sleep disorder, unspecified: Secondary | ICD-10-CM | POA: Diagnosis present

## 2019-11-05 DIAGNOSIS — T383X6A Underdosing of insulin and oral hypoglycemic [antidiabetic] drugs, initial encounter: Secondary | ICD-10-CM | POA: Diagnosis present

## 2019-11-05 DIAGNOSIS — Y929 Unspecified place or not applicable: Secondary | ICD-10-CM

## 2019-11-05 DIAGNOSIS — R6510 Systemic inflammatory response syndrome (SIRS) of non-infectious origin without acute organ dysfunction: Secondary | ICD-10-CM

## 2019-11-05 DIAGNOSIS — L03317 Cellulitis of buttock: Secondary | ICD-10-CM

## 2019-11-05 DIAGNOSIS — J9601 Acute respiratory failure with hypoxia: Secondary | ICD-10-CM | POA: Diagnosis present

## 2019-11-05 DIAGNOSIS — Z720 Tobacco use: Secondary | ICD-10-CM

## 2019-11-05 DIAGNOSIS — E1111 Type 2 diabetes mellitus with ketoacidosis with coma: Secondary | ICD-10-CM

## 2019-11-05 DIAGNOSIS — Z91128 Patient's intentional underdosing of medication regimen for other reason: Secondary | ICD-10-CM

## 2019-11-05 DIAGNOSIS — L0231 Cutaneous abscess of buttock: Secondary | ICD-10-CM | POA: Diagnosis present

## 2019-11-05 DIAGNOSIS — E872 Acidosis: Secondary | ICD-10-CM | POA: Diagnosis present

## 2019-11-05 DIAGNOSIS — Z20822 Contact with and (suspected) exposure to covid-19: Secondary | ICD-10-CM | POA: Diagnosis present

## 2019-11-05 DIAGNOSIS — Z885 Allergy status to narcotic agent status: Secondary | ICD-10-CM

## 2019-11-05 DIAGNOSIS — K589 Irritable bowel syndrome without diarrhea: Secondary | ICD-10-CM | POA: Diagnosis present

## 2019-11-05 DIAGNOSIS — R0602 Shortness of breath: Secondary | ICD-10-CM

## 2019-11-05 DIAGNOSIS — E876 Hypokalemia: Secondary | ICD-10-CM | POA: Diagnosis not present

## 2019-11-05 DIAGNOSIS — E1110 Type 2 diabetes mellitus with ketoacidosis without coma: Principal | ICD-10-CM | POA: Diagnosis present

## 2019-11-05 DIAGNOSIS — M545 Low back pain, unspecified: Secondary | ICD-10-CM

## 2019-11-05 DIAGNOSIS — F17210 Nicotine dependence, cigarettes, uncomplicated: Secondary | ICD-10-CM | POA: Diagnosis present

## 2019-11-05 DIAGNOSIS — Z23 Encounter for immunization: Secondary | ICD-10-CM

## 2019-11-05 DIAGNOSIS — G9341 Metabolic encephalopathy: Secondary | ICD-10-CM | POA: Diagnosis present

## 2019-11-05 LAB — CBC
HCT: 51.4 % — ABNORMAL HIGH (ref 36.0–46.0)
Hemoglobin: 16.2 g/dL — ABNORMAL HIGH (ref 12.0–15.0)
MCH: 32 pg (ref 26.0–34.0)
MCHC: 31.5 g/dL (ref 30.0–36.0)
MCV: 101.6 fL — ABNORMAL HIGH (ref 80.0–100.0)
Platelets: 457 10*3/uL — ABNORMAL HIGH (ref 150–400)
RBC: 5.06 MIL/uL (ref 3.87–5.11)
RDW: 11.7 % (ref 11.5–15.5)
WBC: 23.8 10*3/uL — ABNORMAL HIGH (ref 4.0–10.5)
nRBC: 0 % (ref 0.0–0.2)

## 2019-11-05 LAB — BLOOD GAS, VENOUS
Acid-base deficit: 28 mmol/L — ABNORMAL HIGH (ref 0.0–2.0)
Bicarbonate: 3.8 mmol/L — ABNORMAL LOW (ref 20.0–28.0)
O2 Saturation: 68.8 %
Patient temperature: 37
pCO2, Ven: 19 mmHg — CL (ref 44.0–60.0)
pH, Ven: 6.91 — CL (ref 7.250–7.430)
pO2, Ven: 62 mmHg — ABNORMAL HIGH (ref 32.0–45.0)

## 2019-11-05 LAB — GLUCOSE, CAPILLARY: Glucose-Capillary: 465 mg/dL — ABNORMAL HIGH (ref 70–99)

## 2019-11-05 LAB — LACTIC ACID, PLASMA: Lactic Acid, Venous: 4.2 mmol/L (ref 0.5–1.9)

## 2019-11-05 MED ORDER — LACTATED RINGERS IV BOLUS (SEPSIS)
1000.0000 mL | Freq: Once | INTRAVENOUS | Status: AC
  Administered 2019-11-06: 1000 mL via INTRAVENOUS

## 2019-11-05 MED ORDER — VANCOMYCIN HCL IN DEXTROSE 1-5 GM/200ML-% IV SOLN
1000.0000 mg | Freq: Once | INTRAVENOUS | Status: AC
  Administered 2019-11-06: 1000 mg via INTRAVENOUS
  Filled 2019-11-05: qty 200

## 2019-11-05 MED ORDER — DROPERIDOL 2.5 MG/ML IJ SOLN
2.5000 mg | Freq: Once | INTRAMUSCULAR | Status: AC
  Administered 2019-11-05: 2.5 mg via INTRAVENOUS
  Filled 2019-11-05: qty 2

## 2019-11-05 MED ORDER — SODIUM CHLORIDE 0.9 % IV SOLN
2.0000 g | Freq: Once | INTRAVENOUS | Status: AC
  Administered 2019-11-06: 2 g via INTRAVENOUS
  Filled 2019-11-05: qty 2

## 2019-11-05 MED ORDER — SODIUM CHLORIDE 0.9 % IV BOLUS
1000.0000 mL | Freq: Once | INTRAVENOUS | Status: AC
  Administered 2019-11-06: 1000 mL via INTRAVENOUS

## 2019-11-05 MED ORDER — SODIUM CHLORIDE 0.9 % IV BOLUS
1000.0000 mL | Freq: Once | INTRAVENOUS | Status: AC
  Administered 2019-11-05: 1000 mL via INTRAVENOUS

## 2019-11-05 MED ORDER — METRONIDAZOLE IN NACL 5-0.79 MG/ML-% IV SOLN
500.0000 mg | Freq: Once | INTRAVENOUS | Status: AC
  Administered 2019-11-06: 500 mg via INTRAVENOUS
  Filled 2019-11-05: qty 100

## 2019-11-05 MED ORDER — MORPHINE SULFATE (PF) 4 MG/ML IV SOLN
4.0000 mg | Freq: Once | INTRAVENOUS | Status: AC
  Administered 2019-11-05: 4 mg via INTRAVENOUS
  Filled 2019-11-05: qty 1

## 2019-11-05 NOTE — Progress Notes (Signed)
Pt poor historian, unable to get medication list. Upon review I can only find recent fill for doxycycline. No other medications have fill dates from pharmacy, this includes metformin.

## 2019-11-05 NOTE — ED Provider Notes (Signed)
[facility] Emergency Department Provider Note  ____________________________________________   First MD Initiated Contact with Patient 11/05/19 2253     (approximate)  I have reviewed the triage vital signs and the nursing notes.   HISTORY  Chief Complaint Abscess and Shortness of Breath  Level 5 caveat:  history/ROS limited by acute/critical illness  HPI Brittany Bates is a 44 y.o. female with diabetes but takes no medicine according to her husband and who was seen in this emergency department  about 36 hours ago for the incision and drainage of an abscess on her buttocks.  She presents tonight for what the husband describes as several days of not feeling well they got much more severe tonight.  He is a vague historian and the patient is unable to provide any history at all.  He reports that she has not had a bowel movement for several days and that as of today, starting early in the morning, she complained of pain all over her body including her abdomen and her chest and she was less responsive than usual.  She could not give any specific complaints or concerns to him nor to me.  He said that she had a subjective fever but no measured fever but they did not take her temperature.  He is not sure if she has been vomiting.  She has not been eating or drinking well.  The symptoms were severe by tonight which brought him to the emergency department.  He reports that she was told some time ago that she has diabetes but she did not want to take medicine so she has been trying to " monitor and take care of it" with what she eats but she does not regularly check her blood glucose.  She is altered, moaning and occasionally crying out, but unable to answer any questions.  She is breathing rapidly as well.  The husband reports that neither she nor he have had the COVID-19 vaccine.        Past Medical History:  Diagnosis Date  . Bronchitis   . Diabetes mellitus  without complication (HCC)   . Migraine     Patient Active Problem List   Diagnosis Date Noted  . DKA (diabetic ketoacidoses) (HCC) 11/06/2019    Past Surgical History:  Procedure Laterality Date  . Abcess removal      Prior to Admission medications   Medication Sig Start Date End Date Taking? Authorizing Provider  doxycycline (VIBRA-TABS) 100 MG tablet Take 1 tablet (100 mg total) by mouth 2 (two) times daily for 7 days. 11/04/19 11/11/19 Yes Delton Prairie, MD  cetirizine (ZYRTEC) 10 MG tablet Take 1 tablet (10 mg total) by mouth daily. Patient not taking: Reported on 11/06/2019 04/06/17   Cuthriell, Delorise Royals, PA-C  fluticasone (FLONASE) 50 MCG/ACT nasal spray Place 1 spray into both nostrils 2 (two) times daily. Patient not taking: Reported on 11/06/2019 04/06/17   Cuthriell, Delorise Royals, PA-C  metFORMIN (GLUCOPHAGE) 500 MG tablet Take 1 tablet (500 mg total) 2 (two) times daily with a meal by mouth. Patient not taking: Reported on 11/06/2019 02/12/17   Tommi Rumps, PA-C    Allergies Latex and Morphine and related  History reviewed. No pertinent family history.  Social History Social History   Tobacco Use  . Smoking status: Current Every Day Smoker    Packs/day: 0.50    Types: Cigarettes  . Smokeless tobacco: Never Used  Substance Use Topics  . Alcohol use: No  .  Drug use: No    Review of Systems Level 5 caveat:  history/ROS limited by acute/critical illness  ____________________________________________   PHYSICAL EXAM:  VITAL SIGNS: ED Triage Vitals  Enc Vitals Group     BP 11/05/19 2121 (!) 164/98     Pulse Rate 11/05/19 2121 (!) 131     Resp 11/05/19 2121 (!) 25     Temp 11/05/19 2121 97.6 F (36.4 C)     Temp Source 11/05/19 2121 Oral     SpO2 11/05/19 2121 98 %     Weight 11/05/19 2123 65.8 kg (145 lb)     Height 11/05/19 2123 1.676 m (5\' 6" )     Head Circumference --      Peak Flow --      Pain Score 11/05/19 2122 10     Pain Loc --      Pain Edu? --       Excl. in GC? --     Constitutional: Awake but altered, minimally responsive except to painful stimuli, agitated and requiring frequent redirection, pulled out her peripheral IV shortly after it was placed. Eyes: Conjunctivae are normal.  Head: Atraumatic. Nose: No congestion/rhinnorhea. Mouth/Throat: Patient will not wear a mask.  Mucous membranes are dry. Neck: No stridor.  No meningeal signs.   Cardiovascular: Tachycardia, regular rhythm. Good peripheral circulation. Grossly normal heart sounds. Respiratory: No accessory muscle usage and no abnormal breath sounds but the patient is breathing deeply and rapidly. Gastrointestinal: Soft and distended.  In spite of moaning and indicating abdominal pain, she has no tenderness to palpation in her abdomen is soft. Musculoskeletal: No lower extremity tenderness nor edema. No gross deformities of extremities. Neurologic: Moving all 4 extremities but minimally responsive.  GCS 10 (Eye 4, Verbal 2, Motor 4) Skin:  Skin is warm, dry and intact.   ____________________________________________   LABS (all labs ordered are listed, but only abnormal results are displayed)  Labs Reviewed  COMPREHENSIVE METABOLIC PANEL - Abnormal; Notable for the following components:      Result Value   CO2 <7 (*)    Glucose, Bld 528 (*)    BUN 25 (*)    Creatinine, Ser 1.31 (*)    Total Protein 8.5 (*)    AST 14 (*)    Alkaline Phosphatase 135 (*)    Total Bilirubin 1.8 (*)    GFR calc non Af Amer 50 (*)    GFR calc Af Amer 58 (*)    All other components within normal limits  CBC - Abnormal; Notable for the following components:   WBC 23.8 (*)    Hemoglobin 16.2 (*)    HCT 51.4 (*)    MCV 101.6 (*)    Platelets 457 (*)    All other components within normal limits  LACTIC ACID, PLASMA - Abnormal; Notable for the following components:   Lactic Acid, Venous 4.2 (*)    All other components within normal limits  LACTIC ACID, PLASMA - Abnormal; Notable  for the following components:   Lactic Acid, Venous 2.7 (*)    All other components within normal limits  GLUCOSE, CAPILLARY - Abnormal; Notable for the following components:   Glucose-Capillary 465 (*)    All other components within normal limits  BLOOD GAS, VENOUS - Abnormal; Notable for the following components:   pH, Ven 6.91 (*)    pCO2, Ven 19 (*)    pO2, Ven 62.0 (*)    Bicarbonate 3.8 (*)    Acid-base deficit 28.0 (*)  All other components within normal limits  BETA-HYDROXYBUTYRIC ACID - Abnormal; Notable for the following components:   Beta-Hydroxybutyric Acid >8.00 (*)    All other components within normal limits  OSMOLALITY - Abnormal; Notable for the following components:   Osmolality 337 (*)    All other components within normal limits  GLUCOSE, CAPILLARY - Abnormal; Notable for the following components:   Glucose-Capillary 548 (*)    All other components within normal limits  GLUCOSE, CAPILLARY - Abnormal; Notable for the following components:   Glucose-Capillary 480 (*)    All other components within normal limits  TROPONIN I (HIGH SENSITIVITY) - Abnormal; Notable for the following components:   Troponin I (High Sensitivity) 25 (*)    All other components within normal limits  SARS CORONAVIRUS 2 BY RT PCR (HOSPITAL ORDER, PERFORMED IN [facility])  CULTURE, BLOOD (ROUTINE X 2)  CULTURE, BLOOD (ROUTINE X 2)  URINALYSIS, COMPLETE (UACMP) WITH MICROSCOPIC  PROCALCITONIN  PROTIME-INR  HIV ANTIBODY (ROUTINE TESTING W REFLEX)  CBC  CREATININE, SERUM  CBC WITH DIFFERENTIAL/PLATELET  MAGNESIUM  BASIC METABOLIC PANEL  BASIC METABOLIC PANEL  BASIC METABOLIC PANEL  BASIC METABOLIC PANEL  BASIC METABOLIC PANEL  POC URINE PREG, ED  CBG MONITORING, ED  TROPONIN I (HIGH SENSITIVITY)   ____________________________________________  EKG  ED ECG REPORT I, Loleta Rose, the attending physician, personally viewed and interpreted this ECG.  Date:  11/05/2019 EKG Time: 22: 23 Rate: 125 Rhythm: Sinus tachycardia QRS Axis: normal Intervals: normal ST/T Wave abnormalities: Non-specific ST segment / T-wave changes, but no clear evidence of acute ischemia. Narrative Interpretation: no definitive evidence of acute ischemia; does not meet STEMI criteria.   ____________________________________________  RADIOLOGY I, Loleta Rose, personally viewed and evaluated these images (plain radiographs) as part of my medical decision making, as well as reviewing the written report by the radiologist.  ED MD interpretation: No acute abnormalities on chest x-ray  Official radiology report(s): CT ABDOMEN PELVIS W CONTRAST  Result Date: 11/06/2019 CLINICAL DATA:  Code sepsis EXAM: CT ABDOMEN AND PELVIS WITH CONTRAST TECHNIQUE: Multidetector CT imaging of the abdomen and pelvis was performed using the standard protocol following bolus administration of intravenous contrast. CONTRAST:  85mL OMNIPAQUE IOHEXOL 300 MG/ML  SOLN COMPARISON:  None. FINDINGS: Lower chest: Lung bases demonstrate hazy dependent atelectasis. 3 mm right lung base nodule, series 4, image number 11. No consolidation or pleural effusion. Motion degradation. Hepatobiliary: No focal liver abnormality is seen. No gallstones, gallbladder wall thickening, or biliary dilatation. Pancreas: Unremarkable. No pancreatic ductal dilatation or surrounding inflammatory changes. Spleen: Normal in size without focal abnormality. Adrenals/Urinary Tract: Right adrenal gland is normal. 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 Nonvisualized left kidney. Compensatory hypertrophy of the right kidney. Multiple punctate intrarenal stones on the right. The bladder is unremarkable. Stomach/Bowel: Stomach is within normal limits. Appendix appears normal. No evidence of bowel wall thickening, distention, or inflammatory changes. Vascular/Lymphatic: Nonaneurysmal aorta with mild aortic  atherosclerosis. No suspicious nodes Reproductive: Multiple hypoenhancing masses within the uterus, likely fibroids. No suspicious adnexal mass Other: Negative for free air or free fluid. Musculoskeletal: No acute or significant osseous findings. IMPRESSION: 1. No CT evidence for acute intra-abdominal or pelvic abnormality. 2. 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 Compensatory hypertrophy of the right kidney. Multiple punctate intrarenal stones on the right. 3. 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 4. Fibroid uterus. 5. 3 mm right lung base nodule. No follow-up needed if  patient is low-risk. Non-contrast chest CT can be considered in 12 months if patient is high-risk. This recommendation follows the consensus statement: Guidelines for Management of Incidental Pulmonary Nodules Detected on CT Images: From the Fleischner Society 2017; Radiology 2017; 284:228-243. 6. Aortic atherosclerosis. Aortic Atherosclerosis (ICD10-I70.0). Electronically Signed   By: Jasmine Pang M.D.   On: 11/06/2019 01:52   DG Chest Port 1 View  Result Date: 11/05/2019 CLINICAL DATA:  Abscess to the buttocks for 4 days. Shortness of breath and hyperventilating. EXAM: PORTABLE CHEST 1 VIEW COMPARISON:  11/30/2018 FINDINGS: Shallow inspiration. Heart size and pulmonary vascularity are normal for technique. Lungs are clear. No pleural effusions. No pneumothorax. Mediastinal contours appear intact. IMPRESSION: No active disease. Electronically Signed   By: Burman Nieves M.D.   On: 11/05/2019 22:57    ____________________________________________   PROCEDURES   Procedure(s) performed (including Critical Care):  .Critical Care Performed by: Loleta Rose, MD Authorized by: Loleta Rose, MD   Critical care provider statement:    Critical care time (minutes):  45   Critical care time was exclusive of:  Separately billable procedures and treating other patients   Critical care was necessary to  treat or prevent imminent or life-threatening deterioration of the following conditions:  Metabolic crisis, endocrine crisis and sepsis   Critical care was time spent personally by me on the following activities:  Development of treatment plan with patient or surrogate, discussions with consultants, evaluation of patient's response to treatment, examination of patient, obtaining history from patient or surrogate, ordering and performing treatments and interventions, ordering and review of laboratory studies, ordering and review of radiographic studies, pulse oximetry, re-evaluation of patient's condition and review of old charts     ____________________________________________   INITIAL IMPRESSION / MDM / ASSESSMENT AND PLAN / ED COURSE  As part of my medical decision making, I reviewed the following data within the electronic MEDICAL RECORD NUMBER History obtained from family, Nursing notes reviewed and incorporated, Labs reviewed , EKG interpreted , Old chart reviewed, Radiograph reviewed , Discussed with admitting physician  and Notes from prior ED visits   Differential diagnosis includes, but is not limited to, DKA, sepsis, any given possible source of infection (pulmonary, intra-abdominal, bacteremia, urinary, etc), severe electrolyte abnormality, less likely acute intracranial bleed.  The patient is on the cardiac monitor to evaluate for evidence of arrhythmia and/or significant heart rate changes.  The patient has known diabetes and does not take any medicine.  She is presenting most consistent with DKA although sepsis is not unlikely.  Vital signs are notable for no fever but a heart rate between 120 and 135.  VBG demonstrates a pH of 6.91 and a PCO2 of 19.  Lactic acid is 4.2 which may represent sepsis or could be the result of her DKA.  CBC is notable for a leukocytosis of 23.8.  Fingerstick blood sugar is 465.  I have called the lab and asked them to expedite and call me with the results of  her comprehensive metabolic panel.  The patient is unvaccinated for COVID-19 and a Covid PCR test is pending.  Personally reviewed CXR, no evidence of acute infection.  The patient is agitated and seems to be uncomfortable.  I am giving her droperidol 2.5 mg IV and morphine 4 mg IV for pain and as a calming agent because she pulled out her first peripheral IV.  Her EKG is unremarkable with no sign of ischemia and no QTC prolongation.  Due to overwhelming patient volume  and acuity in the emergency department right now she is currently being managed in a nonmonitored bed but she will be placed on the cardiac monitor at the next fillable opportunity as soon as a room is available.     Clinical Course as of Nov 06 219  Wed Nov 06, 2019  0018 Called to speak with Annabelle Harman in the ICU.  She is occupied with a critically ill patient and will call me back in about 15 minutes.   [CF]  0027 Severely abnormal comprehensive metabolic panel with an incalculable anion gap due to a CO2 of less than 7.  At the very best this would give an anion gap of somewhere around 25 using 7 is the cut off of the CO2.  I have ordered 1 amp sodium bicarb.  Given the report of abdominal pain which is likely due to DKA but could also be the result of intra-abdominal infection, as well as the recent incision and drainage of the pilonidal abscess, I am obtaining a CT abdomen pelvis with IV contrast to make sure there is no intra-abdominal catastrophe that served as a precipitating event for the DKA.  I ordered insulin and fluids per DKA order set.  Patient is calm now after droperidol and morphine.  I updated the patient's husband at the bedside.  She is now in a monitored room.  I will speak with the ICU regarding admission.   [CF]  0028 SARS Coronavirus 2: NEGATIVE [CF]  0100 Beta-Hydroxybutyric Acid(!): >8.00 [CF]  0120 Discussed case by phone with Annabelle Harman in the ICU who will admit.  CT abd/pelvis pending but low suspicion for acute  intraabdominal abnormality.   [CF]  0220 No acute intra-abdominal abnormality identified on CT scan  CT ABDOMEN PELVIS W CONTRAST [CF]    Clinical Course User Index [CF] Loleta Rose, MD     ____________________________________________  FINAL CLINICAL IMPRESSION(S) / ED DIAGNOSES  Final diagnoses:  SIRS (systemic inflammatory response syndrome) (HCC)  Diabetic ketoacidosis with coma associated with type 2 diabetes mellitus (HCC)     MEDICATIONS GIVEN DURING THIS VISIT:  Medications  insulin regular, human (MYXREDLIN) 100 units/ 100 mL infusion (8 Units/hr Intravenous New Bag/Given 11/06/19 0151)  0.9 %  sodium chloride infusion ( Intravenous Stopped 11/06/19 0213)  dextrose 50 % solution 0-50 mL (has no administration in time range)  potassium chloride 10 mEq in 100 mL IVPB (10 mEq Intravenous New Bag/Given 11/06/19 0146)  heparin injection 5,000 Units (has no administration in time range)  sodium bicarbonate 150 mEq in sterile water 1,000 mL infusion ( Intravenous New Bag/Given 11/06/19 0212)  sodium bicarbonate 150 mEq in dextrose 5% 1000 mL infusion (has no administration in time range)  sodium chloride 0.9 % bolus 1,000 mL (0 mLs Intravenous Stopped 11/06/19 0213)  sodium chloride 0.9 % bolus 1,000 mL (0 mLs Intravenous Stopped 11/06/19 0213)  lactated ringers bolus 1,000 mL (0 mLs Intravenous Stopped 11/06/19 0212)    And  lactated ringers bolus 1,000 mL (0 mLs Intravenous Stopped 11/06/19 0212)  ceFEPIme (MAXIPIME) 2 g in sodium chloride 0.9 % 100 mL IVPB (0 g Intravenous Stopped 11/06/19 0053)  metroNIDAZOLE (FLAGYL) IVPB 500 mg (0 mg Intravenous Stopped 11/06/19 0144)  vancomycin (VANCOCIN) IVPB 1000 mg/200 mL premix (0 mg Intravenous Stopped 11/06/19 0143)  droperidol (INAPSINE) 2.5 MG/ML injection 2.5 mg (2.5 mg Intravenous Given 11/05/19 2350)  morphine 4 MG/ML injection 4 mg (4 mg Intravenous Given 11/05/19 2351)  sodium bicarbonate injection 50 mEq (50 mEq  Intravenous Given 11/06/19 0143)    iohexol (OMNIPAQUE) 300 MG/ML solution 100 mL (85 mLs Intravenous Contrast Given 11/06/19 0116)     ED Discharge Orders    None      *Please note:  Despina AriasSabrina C Witherington was evaluated in Emergency Department on 11/06/2019 for the symptoms described in the history of present illness. She was evaluated in the context of the global COVID-19 pandemic, which necessitated consideration that the patient might be at risk for infection with the SARS-CoV-2 virus that causes COVID-19. Institutional protocols and algorithms that pertain to the evaluation of patients at risk for COVID-19 are in a state of rapid change based on information released by regulatory bodies including the CDC and federal and state organizations. These policies and algorithms were followed during the patient's care in the ED.  Some ED evaluations and interventions may be delayed as a result of limited staffing during and after the pandemic.*  Note:  This document was prepared using Dragon voice recognition software and may include unintentional dictation errors.   Loleta RoseForbach, Ricquel Foulk, MD 11/06/19 [phone]

## 2019-11-05 NOTE — ED Notes (Signed)
IV started on pt and fluids began, pt ripped IV out and fluids drained all over the floor. IV site cleaned of blood and dressed

## 2019-11-05 NOTE — ED Triage Notes (Addendum)
Pt to ED with abscess to buttocks x 4 days. Pt to ED yesterday. Pt is poor historian in triage and hyperventilating. Pt reporting she is SOB this is after two ammonia sticks have needed to be used on pt. Oxygen saturation is 100% upon assessment pt continues to hyperventilate.    Upon assessment abscess has been drained recently and is no longer swollen or red. Incision appears to be healing appropriately.

## 2019-11-06 ENCOUNTER — Emergency Department: Payer: BLUE CROSS/BLUE SHIELD

## 2019-11-06 DIAGNOSIS — F17210 Nicotine dependence, cigarettes, uncomplicated: Secondary | ICD-10-CM | POA: Diagnosis present

## 2019-11-06 DIAGNOSIS — J9601 Acute respiratory failure with hypoxia: Secondary | ICD-10-CM | POA: Diagnosis present

## 2019-11-06 DIAGNOSIS — E1110 Type 2 diabetes mellitus with ketoacidosis without coma: Secondary | ICD-10-CM | POA: Diagnosis present

## 2019-11-06 DIAGNOSIS — Z720 Tobacco use: Secondary | ICD-10-CM | POA: Diagnosis not present

## 2019-11-06 DIAGNOSIS — L03317 Cellulitis of buttock: Secondary | ICD-10-CM | POA: Diagnosis present

## 2019-11-06 DIAGNOSIS — T383X6A Underdosing of insulin and oral hypoglycemic [antidiabetic] drugs, initial encounter: Secondary | ICD-10-CM | POA: Diagnosis present

## 2019-11-06 DIAGNOSIS — Z20822 Contact with and (suspected) exposure to covid-19: Secondary | ICD-10-CM | POA: Diagnosis present

## 2019-11-06 DIAGNOSIS — L0231 Cutaneous abscess of buttock: Secondary | ICD-10-CM | POA: Diagnosis present

## 2019-11-06 DIAGNOSIS — Y929 Unspecified place or not applicable: Secondary | ICD-10-CM | POA: Diagnosis not present

## 2019-11-06 DIAGNOSIS — Z885 Allergy status to narcotic agent status: Secondary | ICD-10-CM | POA: Diagnosis not present

## 2019-11-06 DIAGNOSIS — E0811 Diabetes mellitus due to underlying condition with ketoacidosis with coma: Secondary | ICD-10-CM

## 2019-11-06 DIAGNOSIS — E872 Acidosis: Secondary | ICD-10-CM | POA: Diagnosis present

## 2019-11-06 DIAGNOSIS — E0810 Diabetes mellitus due to underlying condition with ketoacidosis without coma: Secondary | ICD-10-CM | POA: Diagnosis not present

## 2019-11-06 DIAGNOSIS — Z23 Encounter for immunization: Secondary | ICD-10-CM | POA: Diagnosis not present

## 2019-11-06 DIAGNOSIS — Z91128 Patient's intentional underdosing of medication regimen for other reason: Secondary | ICD-10-CM | POA: Diagnosis not present

## 2019-11-06 DIAGNOSIS — G9341 Metabolic encephalopathy: Secondary | ICD-10-CM | POA: Diagnosis present

## 2019-11-06 DIAGNOSIS — G479 Sleep disorder, unspecified: Secondary | ICD-10-CM | POA: Diagnosis present

## 2019-11-06 DIAGNOSIS — K589 Irritable bowel syndrome without diarrhea: Secondary | ICD-10-CM | POA: Diagnosis present

## 2019-11-06 DIAGNOSIS — E876 Hypokalemia: Secondary | ICD-10-CM | POA: Diagnosis not present

## 2019-11-06 LAB — BASIC METABOLIC PANEL
Anion gap: 17 — ABNORMAL HIGH (ref 5–15)
Anion gap: 13 (ref 5–15)
Anion gap: 15 (ref 5–15)
Anion gap: 12 (ref 5–15)
BUN: 27 mg/dL — ABNORMAL HIGH (ref 6–20)
BUN: 23 mg/dL — ABNORMAL HIGH (ref 6–20)
BUN: 20 mg/dL (ref 6–20)
BUN: 19 mg/dL (ref 6–20)
CO2: 11 mmol/L — ABNORMAL LOW (ref 22–32)
CO2: 12 mmol/L — ABNORMAL LOW (ref 22–32)
CO2: 12 mmol/L — ABNORMAL LOW (ref 22–32)
CO2: 17 mmol/L — ABNORMAL LOW (ref 22–32)
Calcium: 8.8 mg/dL — ABNORMAL LOW (ref 8.9–10.3)
Calcium: 9.1 mg/dL (ref 8.9–10.3)
Calcium: 9.2 mg/dL (ref 8.9–10.3)
Calcium: 9.3 mg/dL (ref 8.9–10.3)
Chloride: 111 mmol/L (ref 98–111)
Chloride: 112 mmol/L — ABNORMAL HIGH (ref 98–111)
Chloride: 114 mmol/L — ABNORMAL HIGH (ref 98–111)
Chloride: 114 mmol/L — ABNORMAL HIGH (ref 98–111)
Creatinine, Ser: 1.15 mg/dL — ABNORMAL HIGH (ref 0.44–1.00)
Creatinine, Ser: 0.79 mg/dL (ref 0.44–1.00)
Creatinine, Ser: 0.77 mg/dL (ref 0.44–1.00)
Creatinine, Ser: 0.53 mg/dL (ref 0.44–1.00)
GFR calc Af Amer: >60 mL/min (ref >60)
GFR calc Af Amer: >60 mL/min (ref >60)
GFR calc Af Amer: >60 mL/min (ref >60)
GFR calc Af Amer: >60 mL/min (ref >60)
GFR calc non Af Amer: 58 mL/min — ABNORMAL LOW (ref >60)
GFR calc non Af Amer: >60 mL/min (ref >60)
GFR calc non Af Amer: >60 mL/min (ref >60)
GFR calc non Af Amer: >60 mL/min (ref >60)
Glucose, Bld: 382 mg/dL — ABNORMAL HIGH (ref 70–99)
Glucose, Bld: 244 mg/dL — ABNORMAL HIGH (ref 70–99)
Glucose, Bld: 190 mg/dL — ABNORMAL HIGH (ref 70–99)
Glucose, Bld: 124 mg/dL — ABNORMAL HIGH (ref 70–99)
Potassium: 3 mmol/L — ABNORMAL LOW (ref 3.5–5.1)
Potassium: 2.8 mmol/L — ABNORMAL LOW (ref 3.5–5.1)
Potassium: 2.8 mmol/L — ABNORMAL LOW (ref 3.5–5.1)
Potassium: 3.1 mmol/L — ABNORMAL LOW (ref 3.5–5.1)
Sodium: 139 mmol/L (ref 135–145)
Sodium: 137 mmol/L (ref 135–145)
Sodium: 141 mmol/L (ref 135–145)
Sodium: 143 mmol/L (ref 135–145)

## 2019-11-06 LAB — PROCALCITONIN: Procalcitonin: 3.6 ng/mL

## 2019-11-06 LAB — URINALYSIS, COMPLETE (UACMP) WITH MICROSCOPIC
Bilirubin Urine: NEGATIVE
Glucose, UA: >=500 mg/dL — AB
Ketones, ur: 80 mg/dL — AB
Leukocytes,Ua: NEGATIVE
Nitrite: NEGATIVE
Protein, ur: 100 mg/dL — AB
Specific Gravity, Urine: 1.017 (ref 1.005–1.030)
Squamous Epithelial / HPF: NONE SEEN (ref 0–5)
WBC, UA: NONE SEEN WBC/hpf (ref 0–5)
pH: 5 (ref 5.0–8.0)

## 2019-11-06 LAB — GLUCOSE, CAPILLARY
Glucose-Capillary: 480 mg/dL — ABNORMAL HIGH (ref 70–99)
Glucose-Capillary: 548 mg/dL (ref 70–99)
Glucose-Capillary: 410 mg/dL — ABNORMAL HIGH (ref 70–99)
Glucose-Capillary: 454 mg/dL — ABNORMAL HIGH (ref 70–99)
Glucose-Capillary: 316 mg/dL — ABNORMAL HIGH (ref 70–99)
Glucose-Capillary: 284 mg/dL — ABNORMAL HIGH (ref 70–99)
Glucose-Capillary: 299 mg/dL — ABNORMAL HIGH (ref 70–99)
Glucose-Capillary: 208 mg/dL — ABNORMAL HIGH (ref 70–99)
Glucose-Capillary: 190 mg/dL — ABNORMAL HIGH (ref 70–99)
Glucose-Capillary: 190 mg/dL — ABNORMAL HIGH (ref 70–99)
Glucose-Capillary: 166 mg/dL — ABNORMAL HIGH (ref 70–99)
Glucose-Capillary: 237 mg/dL — ABNORMAL HIGH (ref 70–99)
Glucose-Capillary: 180 mg/dL — ABNORMAL HIGH (ref 70–99)
Glucose-Capillary: 194 mg/dL — ABNORMAL HIGH (ref 70–99)
Glucose-Capillary: 181 mg/dL — ABNORMAL HIGH (ref 70–99)
Glucose-Capillary: 166 mg/dL — ABNORMAL HIGH (ref 70–99)
Glucose-Capillary: 146 mg/dL — ABNORMAL HIGH (ref 70–99)
Glucose-Capillary: 109 mg/dL — ABNORMAL HIGH (ref 70–99)

## 2019-11-06 LAB — OSMOLALITY: Osmolality: 337 mOsm/kg (ref 275–295)

## 2019-11-06 LAB — COMPREHENSIVE METABOLIC PANEL
ALT: 13 U/L (ref 0–44)
AST: 14 U/L — ABNORMAL LOW (ref 15–41)
Albumin: 4.2 g/dL (ref 3.5–5.0)
Alkaline Phosphatase: 135 U/L — ABNORMAL HIGH (ref 38–126)
BUN: 25 mg/dL — ABNORMAL HIGH (ref 6–20)
CO2: <7 mmol/L — ABNORMAL LOW (ref 22–32)
Calcium: 9.9 mg/dL (ref 8.9–10.3)
Chloride: 105 mmol/L (ref 98–111)
Creatinine, Ser: 1.31 mg/dL — ABNORMAL HIGH (ref 0.44–1.00)
GFR calc Af Amer: 58 mL/min — ABNORMAL LOW (ref >60)
GFR calc non Af Amer: 50 mL/min — ABNORMAL LOW (ref >60)
Glucose, Bld: 528 mg/dL (ref 70–99)
Potassium: 4.2 mmol/L (ref 3.5–5.1)
Sodium: 137 mmol/L (ref 135–145)
Total Bilirubin: 1.8 mg/dL — ABNORMAL HIGH (ref 0.3–1.2)
Total Protein: 8.5 g/dL — ABNORMAL HIGH (ref 6.5–8.1)

## 2019-11-06 LAB — MAGNESIUM: Magnesium: 1.9 mg/dL (ref 1.7–2.4)

## 2019-11-06 LAB — CBC WITH DIFFERENTIAL/PLATELET
Abs Immature Granulocytes: 0.53 10*3/uL — ABNORMAL HIGH (ref 0.00–0.07)
Basophils Absolute: 0.1 10*3/uL (ref 0.0–0.1)
Basophils Relative: 1 %
Eosinophils Absolute: 0 10*3/uL (ref 0.0–0.5)
Eosinophils Relative: 0 %
HCT: 47.7 % — ABNORMAL HIGH (ref 36.0–46.0)
Hemoglobin: 15.4 g/dL — ABNORMAL HIGH (ref 12.0–15.0)
Immature Granulocytes: 4 %
Lymphocytes Relative: 6 %
Lymphs Abs: 0.8 10*3/uL (ref 0.7–4.0)
MCH: 32.4 pg (ref 26.0–34.0)
MCHC: 32.3 g/dL (ref 30.0–36.0)
MCV: 100.2 fL — ABNORMAL HIGH (ref 80.0–100.0)
Monocytes Absolute: 1.3 10*3/uL — ABNORMAL HIGH (ref 0.1–1.0)
Monocytes Relative: 9 %
Neutro Abs: 11.6 10*3/uL — ABNORMAL HIGH (ref 1.7–7.7)
Neutrophils Relative %: 80 %
Platelets: 278 10*3/uL (ref 150–400)
RBC: 4.76 MIL/uL (ref 3.87–5.11)
RDW: 11.5 % (ref 11.5–15.5)
WBC: 14.4 10*3/uL — ABNORMAL HIGH (ref 4.0–10.5)
nRBC: 0 % (ref 0.0–0.2)

## 2019-11-06 LAB — BETA-HYDROXYBUTYRIC ACID: Beta-Hydroxybutyric Acid: >8 mmol/L — ABNORMAL HIGH (ref 0.05–0.27)

## 2019-11-06 LAB — LACTIC ACID, PLASMA: Lactic Acid, Venous: 2.7 mmol/L (ref 0.5–1.9)

## 2019-11-06 LAB — HEMOGLOBIN A1C
Hgb A1c MFr Bld: 13.5 % — ABNORMAL HIGH (ref 4.8–5.6)
Mean Plasma Glucose: 340.75 mg/dL

## 2019-11-06 LAB — TROPONIN I (HIGH SENSITIVITY)
Troponin I (High Sensitivity): 15 ng/L (ref <18)
Troponin I (High Sensitivity): 25 ng/L — ABNORMAL HIGH (ref <18)

## 2019-11-06 LAB — SARS CORONAVIRUS 2 BY RT PCR (HOSPITAL ORDER, PERFORMED IN ~~LOC~~ HOSPITAL LAB): SARS Coronavirus 2: NEGATIVE

## 2019-11-06 MED ORDER — POTASSIUM CHLORIDE 10 MEQ/100ML IV SOLN
10.0000 meq | INTRAVENOUS | Status: AC
  Administered 2019-11-06 (×2): 10 meq via INTRAVENOUS
  Filled 2019-11-06 (×2): qty 100

## 2019-11-06 MED ORDER — VANCOMYCIN HCL IN DEXTROSE 1-5 GM/200ML-% IV SOLN
1000.0000 mg | INTRAVENOUS | Status: DC
  Administered 2019-11-06: 1000 mg via INTRAVENOUS
  Filled 2019-11-06: qty 200

## 2019-11-06 MED ORDER — INSULIN REGULAR(HUMAN) IN NACL 100-0.9 UT/100ML-% IV SOLN
INTRAVENOUS | Status: DC
  Administered 2019-11-06: 8 [IU]/h via INTRAVENOUS
  Filled 2019-11-06: qty 100

## 2019-11-06 MED ORDER — HYDROMORPHONE HCL 1 MG/ML IJ SOLN
1.0000 mg | INTRAMUSCULAR | Status: DC | PRN
  Administered 2019-11-06 – 2019-11-09 (×11): 1 mg via INTRAVENOUS
  Filled 2019-11-06 (×11): qty 1

## 2019-11-06 MED ORDER — FAMOTIDINE IN NACL 20-0.9 MG/50ML-% IV SOLN
20.0000 mg | Freq: Every day | INTRAVENOUS | Status: DC
  Administered 2019-11-06 – 2019-11-08 (×4): 20 mg via INTRAVENOUS
  Filled 2019-11-06 (×3): qty 50

## 2019-11-06 MED ORDER — LIVING WELL WITH DIABETES BOOK
Freq: Once | Status: AC
  Filled 2019-11-06: qty 1

## 2019-11-06 MED ORDER — DEXTROSE 50 % IV SOLN: 0.0000 mL | INTRAVENOUS | Status: DC | PRN

## 2019-11-06 MED ORDER — DEXTROSE-NACL 5-0.45 % IV SOLN: INTRAVENOUS | Status: DC

## 2019-11-06 MED ORDER — SODIUM CHLORIDE 0.9 % IV SOLN: INTRAVENOUS | Status: DC

## 2019-11-06 MED ORDER — SODIUM BICARBONATE 8.4 % IV SOLN
50.0000 meq | Freq: Once | INTRAVENOUS | Status: AC
  Administered 2019-11-06: 50 meq via INTRAVENOUS
  Filled 2019-11-06: qty 50

## 2019-11-06 MED ORDER — ONDANSETRON HCL 4 MG/2ML IJ SOLN
4.0000 mg | Freq: Four times a day (QID) | INTRAMUSCULAR | Status: DC | PRN
  Administered 2019-11-06: 4 mg via INTRAVENOUS
  Filled 2019-11-06: qty 2

## 2019-11-06 MED ORDER — INSULIN DETEMIR 100 UNIT/ML ~~LOC~~ SOLN
8.0000 [IU] | Freq: Two times a day (BID) | SUBCUTANEOUS | Status: DC
  Administered 2019-11-06 – 2019-11-07 (×2): 8 [IU] via SUBCUTANEOUS
  Filled 2019-11-06 (×4): qty 0.08

## 2019-11-06 MED ORDER — INSULIN ASPART 100 UNIT/ML ~~LOC~~ SOLN
2.0000 [IU] | SUBCUTANEOUS | Status: DC
  Administered 2019-11-06: 2 [IU] via SUBCUTANEOUS
  Administered 2019-11-07: 6 [IU] via SUBCUTANEOUS
  Administered 2019-11-07 (×2): 2 [IU] via SUBCUTANEOUS
  Filled 2019-11-06 (×4): qty 1

## 2019-11-06 MED ORDER — HEPARIN SODIUM (PORCINE) 5000 UNIT/ML IJ SOLN
5000.0000 [IU] | Freq: Three times a day (TID) | INTRAMUSCULAR | Status: DC
  Administered 2019-11-06 – 2019-11-09 (×10): 5000 [IU] via SUBCUTANEOUS
  Filled 2019-11-06 (×10): qty 1

## 2019-11-06 MED ORDER — STERILE WATER FOR INJECTION IV SOLN
INTRAVENOUS | Status: DC
  Filled 2019-11-06: qty 150
  Filled 2019-11-06 (×2): qty 850
  Filled 2019-11-06: qty 150

## 2019-11-06 MED ORDER — IOHEXOL 300 MG/ML  SOLN
100.0000 mL | Freq: Once | INTRAMUSCULAR | Status: AC | PRN
  Administered 2019-11-06: 85 mL via INTRAVENOUS

## 2019-11-06 MED ORDER — POTASSIUM CHLORIDE 10 MEQ/100ML IV SOLN
10.0000 meq | INTRAVENOUS | Status: AC
  Administered 2019-11-06 (×5): 10 meq via INTRAVENOUS
  Filled 2019-11-06 (×4): qty 100

## 2019-11-06 MED ORDER — POTASSIUM CHLORIDE 10 MEQ/100ML IV SOLN
10.0000 meq | INTRAVENOUS | Status: DC
  Administered 2019-11-06: 10 meq via INTRAVENOUS
  Filled 2019-11-06 (×2): qty 100

## 2019-11-06 MED ORDER — MAGNESIUM SULFATE 2 GM/50ML IV SOLN
2.0000 g | Freq: Once | INTRAVENOUS | Status: AC
  Administered 2019-11-06: 2 g via INTRAVENOUS
  Filled 2019-11-06: qty 50

## 2019-11-06 MED ORDER — STERILE WATER FOR INJECTION IV SOLN
INTRAVENOUS | Status: DC
  Filled 2019-11-06 (×4): qty 850

## 2019-11-06 MED ORDER — POTASSIUM CHLORIDE 10 MEQ/100ML IV SOLN
10.0000 meq | INTRAVENOUS | Status: AC
  Administered 2019-11-06 – 2019-11-07 (×4): 10 meq via INTRAVENOUS
  Filled 2019-11-06 (×3): qty 100

## 2019-11-06 MED ORDER — VANCOMYCIN HCL 750 MG/150ML IV SOLN
750.0000 mg | Freq: Three times a day (TID) | INTRAVENOUS | Status: DC
  Administered 2019-11-06 – 2019-11-07 (×2): 750 mg via INTRAVENOUS
  Filled 2019-11-06 (×4): qty 150

## 2019-11-06 MED ORDER — SODIUM CHLORIDE 0.9 % IV SOLN
2.0000 g | Freq: Three times a day (TID) | INTRAVENOUS | Status: DC
  Administered 2019-11-06 – 2019-11-09 (×10): 2 g via INTRAVENOUS
  Filled 2019-11-06 (×13): qty 2

## 2019-11-06 MED ORDER — SODIUM BICARBONATE-DEXTROSE 150-5 MEQ/L-% IV SOLN
150.0000 meq | INTRAVENOUS | Status: DC
  Administered 2019-11-06: 150 meq via INTRAVENOUS
  Filled 2019-11-06 (×3): qty 1000

## 2019-11-06 MED ORDER — POTASSIUM CHLORIDE 10 MEQ/100ML IV SOLN
10.0000 meq | INTRAVENOUS | Status: AC
  Administered 2019-11-06 (×3): 10 meq via INTRAVENOUS
  Filled 2019-11-06 (×2): qty 100

## 2019-11-06 NOTE — Progress Notes (Signed)
CODE SEPSIS - PHARMACY COMMUNICATION  **Broad Spectrum Antibiotics should be administered within 1 hour of Sepsis diagnosis**  Time Code Sepsis Called/Page Received: 2306  Antibiotics Ordered: Cefepime, Flagyl, Vancomycin  Time of 1st antibiotic administration: 0012  Additional action taken by pharmacy: per nursing note, pt pulled out IV's  If necessary, Name of Provider/Nurse Contacted: JLaural Roes ,PharmD Clinical Pharmacist  11/06/2019  12:23 AM

## 2019-11-06 NOTE — Consult Note (Signed)
[facility] SURGICAL CONSULTATION NOTE (initial) - cpt: [id number]   HISTORY OF PRESENT ILLNESS (HPI):  44 y.o. female presented to [facility] ED overnight for evaluation of abscess. Patient has a history of presentation to the ED on 08/02 secondary to left gluteal abscess. An I&D was preformed in the ED and she was discharged home on doxycycline with PCP follow up. However, over the last 24 hours or so, she is reporting relatively vague symptoms and "just not feeling well." On presentation to the ED she had endorsed subjective fever and chills, generalized body aches, abdominal pain (which is resolved now), and continued pain in her left gluteal crease around site of I&D. She reports a history of similar abscess in the relatively remote past and she did require surgery for this at that time. Work up in the Ed was concerning for DKA with glucose of 528. She does not check her sugars at home and does not take any medication for this. She did endorse polyuria and polydipsia. Additionally found to have AKI with sCr - 1.31. She also was found to have a lactic acidosis to 4.2. WBC initially was 23K (now improved to 14K). CT Abdomen/Pelvis did not show any acute intra-abdominal pathology. Area of previous I&D with inflammation but not fluid or subcutaneous air. She was admitted to [facility] and started on cefepime, flagyl, vancomycin   Surgery is consulted by PCCM physician Dr. Erin Fulling, MD in this context for evaluation and management of left gluteal abscess.  PAST MEDICAL HISTORY (PMH):  Past Medical History:  Diagnosis Date  . Bronchitis   . Diabetes mellitus without complication (HCC)   . Migraine      PAST SURGICAL HISTORY (PSH):  Past Surgical History:  Procedure Laterality Date  . Abcess removal       MEDICATIONS:  Prior to Admission medications   Medication Sig Start Date End Date Taking? Authorizing Provider  doxycycline (VIBRA-TABS) 100 MG tablet Take 1 tablet (100 mg total) by  mouth 2 (two) times daily for 7 days. 11/04/19 11/11/19 Yes Delton Prairie, MD  cetirizine (ZYRTEC) 10 MG tablet Take 1 tablet (10 mg total) by mouth daily. Patient not taking: Reported on 11/06/2019 04/06/17   Cuthriell, Delorise Royals, PA-C  fluticasone (FLONASE) 50 MCG/ACT nasal spray Place 1 spray into both nostrils 2 (two) times daily. Patient not taking: Reported on 11/06/2019 04/06/17   Cuthriell, Delorise Royals, PA-C  metFORMIN (GLUCOPHAGE) 500 MG tablet Take 1 tablet (500 mg total) 2 (two) times daily with a meal by mouth. Patient not taking: Reported on 11/06/2019 02/12/17   Tommi Rumps, PA-C     ALLERGIES:  Allergies  Allergen Reactions  . Latex Hives  . Morphine And Related Itching     SOCIAL HISTORY:  Social History   Socioeconomic History  . Marital status: Married    Spouse name: Not on file  . Number of children: Not on file  . Years of education: Not on file  . Highest education level: Not on file  Occupational History  . Not on file  Tobacco Use  . Smoking status: Current Every Day Smoker    Packs/day: 0.50    Types: Cigarettes  . Smokeless tobacco: Never Used  Substance and Sexual Activity  . Alcohol use: No  . Drug use: No  . Sexual activity: Not on file  Other Topics Concern  . Not on file  Social History Narrative  . Not on file   Social Determinants of Health  Financial Resource Strain:   . Difficulty of Paying Living Expenses:   Food Insecurity:   . Worried About Programme researcher, broadcasting/film/video in the Last Year:   . Barista in the Last Year:   Transportation Needs:   . Freight forwarder (Medical):   Marland Kitchen Lack of Transportation (Non-Medical):   Physical Activity:   . Days of Exercise per Week:   . Minutes of Exercise per Session:   Stress:   . Feeling of Stress :   Social Connections:   . Frequency of Communication with Friends and Family:   . Frequency of Social Gatherings with Friends and Family:   . Attends Religious Services:   . Active Member of  Clubs or Organizations:   . Attends Banker Meetings:   Marland Kitchen Marital Status:   Intimate Partner Violence:   . Fear of Current or Ex-Partner:   . Emotionally Abused:   Marland Kitchen Physically Abused:   . Sexually Abused:      FAMILY HISTORY:  History reviewed. No pertinent family history.    REVIEW OF SYSTEMS:  Review of Systems  Constitutional: Positive for chills and fever (Subjective).  HENT: Negative for congestion and sore throat.   Respiratory: Negative for cough and shortness of breath.   Cardiovascular: Negative for chest pain and palpitations.  Gastrointestinal: Positive for abdominal pain. Negative for nausea and vomiting.  Endo/Heme/Allergies: Positive for polydipsia.  All other systems reviewed and are negative.   VITAL SIGNS:  Temp:  [95.1 F (35.1 C)-98.8 F (37.1 C)] 98.3 F (36.8 C) (08/04 1330) Pulse Rate:  [95-131] 104 (08/04 1330) Resp:  [17-30] 20 (08/04 1330) BP: (89-164)/(54-98) 117/71 (08/04 1330) SpO2:  [98 %-100 %] 99 % (08/04 1330) Weight:  [65.8 kg] 65.8 kg (08/03 2123)     Height: 5\' 6"  (167.6 cm) Weight: 65.8 kg BMI (Calculated): 23.41   INTAKE/OUTPUT:  08/03 0701 - 08/04 0700 In: 4629.6 [I.V.:26.3; IV Piggyback:4603.3] Out: 2700 [Urine:2700]  PHYSICAL EXAM:  Physical Exam Constitutional:      General: She is not in acute distress.    Appearance: She is well-developed and normal weight. She is not ill-appearing.  HENT:     Head: Normocephalic and atraumatic.     Comments: Dry Mucus Membrane    Mouth/Throat:     Mouth: Mucous membranes are dry.     Pharynx: Oropharynx is clear.  Eyes:     Conjunctiva/sclera: Conjunctivae normal.     Pupils: Pupils are equal, round, and reactive to light.  Cardiovascular:     Rate and Rhythm: Regular rhythm. Tachycardia present.     Pulses: Normal pulses.     Heart sounds: No murmur heard.   Pulmonary:     Effort: Pulmonary effort is normal.     Breath sounds: Normal breath sounds. No decreased  breath sounds or wheezing.  Abdominal:     Palpations: Abdomen is soft.     Tenderness: There is no abdominal tenderness. There is no guarding or rebound.  Genitourinary:      Comments: deferred Musculoskeletal:     Right lower leg: No edema.     Left lower leg: No edema.  Skin:    General: Skin is warm and dry.     Coloration: Skin is not pale.     Findings: Erythema present.  Neurological:     General: No focal deficit present.     Mental Status: She is alert and oriented to person, place, and time.  Psychiatric:  Mood and Affect: Mood normal.        Behavior: Behavior normal.      Labs:  CBC Latest Ref Rng & Units 11/06/2019 11/05/2019  WBC 4.0 - 10.5 K/uL 14.4(H) 23.8(H)  Hemoglobin 12.0 - 15.0 g/dL 15.4(H) 16.2(H)  Hematocrit 36 - 46 % 47.7(H) 51.4(H)  Platelets 150 - 400 K/uL 278 457(H)   CMP Latest Ref Rng & Units 11/06/2019 11/06/2019 11/05/2019  Glucose 70 - 99 mg/dL 294(T) 654(Y) 503(TW)  BUN 6 - 20 mg/dL 65(K) 81(E) 75(T)  Creatinine 0.44 - 1.00 mg/dL 7.00 1.74(B) 4.49(Q)  Sodium 135 - 145 mmol/L 137 139 137  Potassium 3.5 - 5.1 mmol/L 2.8(L) 3.0(L) 4.2  Chloride 98 - 111 mmol/L 112(H) 111 105  CO2 22 - 32 mmol/L 12(L) 11(L) <7(L)  Calcium 8.9 - 10.3 mg/dL 9.1 7.5(F) 9.9  Total Protein 6.5 - 8.1 g/dL - - 8.5(H)  Total Bilirubin 0.3 - 1.2 mg/dL - - 1.8(H)  Alkaline Phos 38 - 126 U/L - - 135(H)  AST 15 - 41 U/L - - 14(L)  ALT 0 - 44 U/L - - 13     Imaging studies:   CT Abdomen/Pelvis (11/06/2019) personally reviewed showing inflammatory changes to the left gluteal crease near previous I&D, no obvious abscess, no subcutaneous emphysema, significant stool burden throughout the colon, and radiologist report reviewed:  IMPRESSION: 1. No CT evidence for acute intra-abdominal or pelvic abnormality. 2. 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 Compensatory hypertrophy of the right kidney. Multiple punctate intrarenal stones on  the right. 3. 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 4. Fibroid uterus. 5. 3 mm right lung base nodule. No follow-up needed if patient is low-risk. Non-contrast chest CT can be considered in 12 months if patient is high-risk. This recommendation follows the consensus statement:   Assessment/Plan: (ICD-10's: L02.31) 44 y.o. female with left gluteal cleft abscess s/p I7D in the ED on 08/02 without further evidence of abscess or necrotizing infection on examination or imaging, complicated by DKA.   - No evidence of undrained abscess or necrotizing infection on examination or imaging; we will continue to follow to ensure this does not develop.   - Continue IV ABx  - Pain control prn  - Monitor leukocytosis; fever curce  - Further management per PCCM team  All of the above findings and recommendations were discussed with the patient her husband, and all of their questions were answered to their expressed satisfaction.  Thank you for the opportunity to participate in this patient's care.   -- Lynden Oxford, PA-C [facility] 11/06/2019, 3:34 PM [phone] M-F: 7am - 4pm

## 2019-11-06 NOTE — Progress Notes (Signed)
PHARMACY CONSULT NOTE - FOLLOW UP  Pharmacy Consult for Electrolyte Monitoring and Replacement   Recent Labs: Potassium (mmol/L)  Date Value  11/06/2019 2.8 (L)   Magnesium (mg/dL)  Date Value  95/28/4132 1.9   Calcium (mg/dL)  Date Value  44/04/270 9.2   Albumin (g/dL)  Date Value  53/66/4403 4.2   Sodium (mmol/L)  Date Value  11/06/2019 141     Assessment: 44 year old female admitted with DKA on insulin drip. Also on bicarbonate drip for acidosis. Pharmacy to manage electrolytes.  Goal of Therapy:  Electrolytes WNL, K > 4 while on insulin drip  Plan:  --8/4 1725 K 2.8. Patient has received 2 doses of potassium 10 mEq IV out of 5 ordered. Patient is NPO. Will continue to follow closely.  --Patient is transitioning off insulin drip to SQ insulin. Anion gap is resolving slowly but CO2 remains low at 12. Patient remains on bicarb infusion. --BMPs ordered every 4 hours.  Tressie Ellis 11/06/2019 6:43 PM

## 2019-11-06 NOTE — ED Notes (Signed)
Attempted to call report

## 2019-11-06 NOTE — ED Notes (Signed)
Insulin, K+, and bicarb moved to wright wirst to open line for cefepime.  Compatible medications cleared by pharmacy

## 2019-11-06 NOTE — ED Notes (Signed)
Brittany Bates attempted IV access x2

## 2019-11-06 NOTE — ED Notes (Signed)
Insulin rate changed to 1.5 per Endo Tool

## 2019-11-06 NOTE — H&P (Signed)
Name: Brittany Bates MRN: [id number] DOB: November 06, 1975    ADMISSION DATE:  11/05/2019 CONSULTATION DATE: 11/05/2019  REFERRING MD : Dr. York Cerise   CHIEF COMPLAINT: Abscess   BRIEF PATIENT DESCRIPTION:  44 yo female admitted with buttocks abscess and DKA requiring insulin gtt   SIGNIFICANT EVENTS/STUDIES:  08/4: Pt admitted to ICU requiring insulin gtt  08/4: CT Abd Pelvis revealed no CT evidence for acute intra-abdominal or pelvic abnormality. 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 Compensatory hypertrophy of the right kidney. 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 Fibroid uterus. 3 mm right lung base nodule. No follow-up needed if patient is low-risk. Non-contrast chest CT can be considered in 12 months if patient is high-risk. This recommendation follows the consensus statement: Guidelines for Management of Incidental Pulmonary Nodules Detected on CT Images: From the Fleischner Society 2017; Radiology 2017; 284:228-243. Aortic atherosclerosis. (ICD10-I70.0).  HISTORY OF PRESENT ILLNESS:  This is a 44 yo female with a PMH of Migraines, IBS,Type II Diabetes Mellitus, and Bronchitis.  She presented to [facility] ER on 08/3 due to previous pt complaints of not feeling well over the past several days, constipation, and generalized pain according to her husband.  She also has had an abscess on her buttocks x5 days and underwent an I&D (drained scant amount of purulent drainage) in the ER on 08/2; started on a course of doxycycline; and advised to follow-up with her PCP.  During current ER presentation pt altered with agitation and short of breath.  Lab results revealed CO@ <7, glucose 528, BUN 25, creatinine 1.31, troponin 25, lactic acid 4.2, osmolality 337, wbc 23.8, beta hydoxy >8.00, and vbg pH 6.91/pCO2 19/bicarb 3.8.  Pt received cefepime, droperidol, 2L LR bolus, 2L NS, flagyl, morphine, potassium, and vancomycin.  Insulin  gtt initiated due to DKA.  According to pts husband the pt has been managing her diabetes with weight management/healthy eating and has not required medication.  She was subsequently admitted to ICU for additional workup and treatment per PCCM team.  PAST MEDICAL HISTORY :   has a past medical history of Bronchitis, Diabetes mellitus without complication (HCC), and Migraine.  has a past surgical history that includes Abcess removal. Prior to Admission medications   Medication Sig Start Date End Date Taking? Authorizing Provider  doxycycline (VIBRA-TABS) 100 MG tablet Take 1 tablet (100 mg total) by mouth 2 (two) times daily for 7 days. 11/04/19 11/11/19 Yes Delton Prairie, MD  cetirizine (ZYRTEC) 10 MG tablet Take 1 tablet (10 mg total) by mouth daily. Patient not taking: Reported on 11/06/2019 04/06/17   Cuthriell, Delorise Royals, PA-C  fluticasone (FLONASE) 50 MCG/ACT nasal spray Place 1 spray into both nostrils 2 (two) times daily. Patient not taking: Reported on 11/06/2019 04/06/17   Cuthriell, Delorise Royals, PA-C  metFORMIN (GLUCOPHAGE) 500 MG tablet Take 1 tablet (500 mg total) 2 (two) times daily with a meal by mouth. Patient not taking: Reported on 11/06/2019 02/12/17   Tommi Rumps, PA-C   Allergies  Allergen Reactions  . Latex Hives  . Morphine And Related Itching    FAMILY HISTORY:  family history is not on file. SOCIAL HISTORY:  reports that she has been smoking cigarettes. She has been smoking about 0.50 packs per day. She has never used smokeless tobacco. She reports that she does not drink alcohol and does not use drugs.  REVIEW OF SYSTEMS:   Unable to assess pt confused   SUBJECTIVE:  Unable to assess pt confused   VITAL SIGNS: Temp:  [95.1 F (35.1 C)-97.6 F (36.4 C)] 95.1 F (35.1 C) (08/04 0222) Pulse Rate:  [109-131] 109 (08/04 0222) Resp:  [25-26] 26 (08/04 0222) BP: (110-164)/(63-98) 110/63 (08/04 0222) SpO2:  [98 %-99 %] 99 % (08/04 0222) Weight:  [65.8 kg] 65.8 kg  (08/03 2123)  PHYSICAL EXAMINATION: General: acutely ill appearing female, restless in bed  Neuro: oriented to self, opens eyes on command, moves all extremities, PERRL HEENT: subtle no JVD  Cardiovascular: sinus tach, no R/G Lungs: clear throughout, even, non labored  Abdomen: +BS x4, soft, non tender, non distended  Musculoskeletal: normal bulk and tone, no edema  Skin: buttocks wound post I&D of abscess   Recent Labs  Lab 11/05/19 2208  NA 137  K 4.2  CL 105  CO2 <7*  BUN 25*  CREATININE 1.31*  GLUCOSE 528*   Recent Labs  Lab 11/05/19 2208  HGB 16.2*  HCT 51.4*  WBC 23.8*  PLT 457*   CT ABDOMEN PELVIS W CONTRAST  Result Date: 11/06/2019 CLINICAL DATA:  Code sepsis EXAM: CT ABDOMEN AND PELVIS WITH CONTRAST TECHNIQUE: Multidetector CT imaging of the abdomen and pelvis was performed using the standard protocol following bolus administration of intravenous contrast. CONTRAST:  54mL OMNIPAQUE IOHEXOL 300 MG/ML  SOLN COMPARISON:  None. FINDINGS: Lower chest: Lung bases demonstrate hazy dependent atelectasis. 3 mm right lung base nodule, series 4, image number 11. No consolidation or pleural effusion. Motion degradation. Hepatobiliary: No focal liver abnormality is seen. No gallstones, gallbladder wall thickening, or biliary dilatation. Pancreas: Unremarkable. No pancreatic ductal dilatation or surrounding inflammatory changes. Spleen: Normal in size without focal abnormality. Adrenals/Urinary Tract: Right adrenal gland is normal. 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 Nonvisualized left kidney. Compensatory hypertrophy of the right kidney. Multiple punctate intrarenal stones on the right. The bladder is unremarkable. Stomach/Bowel: Stomach is within normal limits. Appendix appears normal. No evidence of bowel wall thickening, distention, or inflammatory changes. Vascular/Lymphatic: Nonaneurysmal aorta with mild aortic atherosclerosis. No suspicious nodes  Reproductive: Multiple hypoenhancing masses within the uterus, likely fibroids. No suspicious adnexal mass Other: Negative for free air or free fluid. Musculoskeletal: No acute or significant osseous findings. IMPRESSION: 1. No CT evidence for acute intra-abdominal or pelvic abnormality. 2. 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 Compensatory hypertrophy of the right kidney. Multiple punctate intrarenal stones on the right. 3. 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 4. Fibroid uterus. 5. 3 mm right lung base nodule. No follow-up needed if patient is low-risk. Non-contrast chest CT can be considered in 12 months if patient is high-risk. This recommendation follows the consensus statement: Guidelines for Management of Incidental Pulmonary Nodules Detected on CT Images: From the Fleischner Society 2017; Radiology 2017; 284:228-243. 6. Aortic atherosclerosis. Aortic Atherosclerosis (ICD10-I70.0). Electronically Signed   By: Jasmine Pang M.D.   On: 11/06/2019 01:52   DG Chest Port 1 View  Result Date: 11/05/2019 CLINICAL DATA:  Abscess to the buttocks for 4 days. Shortness of breath and hyperventilating. EXAM: PORTABLE CHEST 1 VIEW COMPARISON:  11/30/2018 FINDINGS: Shallow inspiration. Heart size and pulmonary vascularity are normal for technique. Lungs are clear. No pleural effusions. No pneumothorax. Mediastinal contours appear intact. IMPRESSION: No active disease. Electronically Signed   By: Burman Nieves M.D.   On: 11/05/2019 22:57    ASSESSMENT / PLAN: Diabetic ketoacidosis secondary to medication noncompliance Acute respiratory failure secondary to severe metabolic acidosis in the  setting of DKA   Supplemental O2 for dyspnea and/or hypoxia  Continue insulin gtt until anion gap closed and serum CO2 >20 Continue sodium bicarb gtt  BMP q4hrs and CBG's per endo tool recommendations  Hemoglobin A1c pending  Keep NPO for now  Diabetes coordinator consulted appreciate input    Mild renal failure with severe metabolic and lactic acidosis  Trend BMP and lactic acid Replace electrolytes as indicated  Monitor UOP Avoid nephrotoxic medications   Leukocytosis secondary to wound infection  Trend WBC and monitor fever curve Trend PCT  Continue cefepime and vancomycin for now  Will order wound culture   Acute encephalopathy secondary to severe metabolic derangements in the setting of DKA-improving  Avoid sedating medication when able  Frequent reorientation   Best Practice VTE px: subq heparin  SUP px: iv pepcid  Code status: Full code Family: updated pts husband at bedside  Sonda Rumble, [facility]  Pulmonary/Critical Care Pager [phone] (please enter 7 digits) PCCM Consult Pager [phone] (please enter 7 digits)

## 2019-11-06 NOTE — Progress Notes (Signed)
PHARMACY CONSULT NOTE - FOLLOW UP  Pharmacy Consult for Electrolyte Monitoring and Replacement   Recent Labs: Potassium (mmol/L)  Date Value  11/06/2019 2.8 (L)   Magnesium (mg/dL)  Date Value  95/12/3265 1.9   Calcium (mg/dL)  Date Value  12/45/8099 9.1   Albumin (g/dL)  Date Value  83/38/2505 4.2   Sodium (mmol/L)  Date Value  11/06/2019 137     Assessment: 44 year old female admitted with DKA on insulin drip. Also on bicarbonate drip for acidosis. Pharmacy to manage electrolytes.  Goal of Therapy:  Electrolytes WNL, K > 4 while on insulin drip  Plan:  8/4 1422 K 2.8. Potassium 10 mEq IV x 5 ordered. BMPs ordered every 4 hours. Will need to follow closely as both insulin and bicarbonate are going to decrease potassium. May need to hold insulin drip if potassium repletion inadequate.  Pricilla Riffle ,PharmD Clinical Pharmacist 11/06/2019 3:13 PM

## 2019-11-06 NOTE — Progress Notes (Signed)
PHARMACY -  BRIEF ANTIBIOTIC NOTE   Pharmacy has received consult(s) for Vancomycin and Cefepime from an ED provider.  The patient's profile has been reviewed for ht/wt/allergies/indication/available labs.    One time order(s) placed for Vancomycin 1gm and Cefepime 2gm  Further antibiotics/pharmacy consults should be ordered by admitting physician if indicated.                       Thank you, Valrie Hart A 11/06/2019  12:24 AM

## 2019-11-06 NOTE — Progress Notes (Signed)
Patient more alert and awake NAD, Left buttocks pain, dilaudid helping, VS stable On insulin  drip and Bicarb drip  Patient feels better.  Will transfer to [facility] service 8/5

## 2019-11-06 NOTE — ED Notes (Signed)
Let Dr. Belia Heman know about pt's swollen right hand. N

## 2019-11-06 NOTE — ED Notes (Signed)
Diabetes coordinator talking with patient and husband

## 2019-11-06 NOTE — ED Notes (Signed)
Notified MD to CBG and anion gap. Awaiting orders to transition to subcu insulin

## 2019-11-06 NOTE — Progress Notes (Signed)
Pharmacy Antibiotic Note  Brittany Bates is a 44 y.o. female admitted on 11/05/2019 with wound infection.  Pharmacy has been consulted for Cefepime and Vancomycin dosing.  Plan: Cefepime 2gm IV q8hrs  Renal function improved. Will change vancomycin to 750 mg IV q8h  Height: 5\' 6"  (167.6 cm) Weight: 65.8 kg (145 lb) IBW/kg (Calculated) : 59.3  Temp (24hrs), Avg:98 F (36.7 C), Min:95.1 F (35.1 C), Max:98.8 F (37.1 C)  Recent Labs  Lab 11/05/19 2208 11/06/19 0010 11/06/19 0353 11/06/19 1422  WBC 23.8*  --  14.4*  --   CREATININE 1.31*  --  1.15* 0.79  LATICACIDVEN 4.2* 2.7*  --   --     Estimated Creatinine Clearance: 84.9 mL/min (by C-G formula based on SCr of 0.79 mg/dL).    Allergies  Allergen Reactions  . Latex Hives  . Morphine And Related Itching    Antimicrobials this admission: Vancomycin 8/4 >> Cefepime 8/4 >>   Microbiology results: 8/4 BCx: pending   Thank you for allowing pharmacy to be a part of this patient's care.  10/4, PharmD 11/06/2019 4:19 PM

## 2019-11-06 NOTE — Progress Notes (Signed)
Pharmacy Antibiotic Note  Brittany Bates is a 43 y.o. female admitted on 11/05/2019 with wound infection.  Pharmacy has been consulted for Cefepime and Vancomycin dosing.  Plan: Cefepime 2gm IV q8hrs  Vancomycin 1000 mg IV Q 24 hrs. Goal AUC 400-550. Expected AUC: 445.1, Css min 10.4 SCr used: 1.31 Pt did not receive a full loading dose so will start next dose early  Height: 5\' 6"  (167.6 cm) Weight: 65.8 kg (145 lb) IBW/kg (Calculated) : 59.3  Temp (24hrs), Avg:96.4 F (35.8 C), Min:95.1 F (35.1 C), Max:97.6 F (36.4 C)  Recent Labs  Lab 11/05/19 2208 11/06/19 0010  WBC 23.8*  --   CREATININE 1.31*  --   LATICACIDVEN 4.2* 2.7*    Estimated Creatinine Clearance: 51.8 mL/min (A) (by C-G formula based on SCr of 1.31 mg/dL (H)).    Allergies  Allergen Reactions  . Latex Hives  . Morphine And Related Itching    Antimicrobials this admission:   >>    >>   Dose adjustments this admission:   Microbiology results:  BCx:   UCx:    Sputum:    MRSA PCR:   Thank you for allowing pharmacy to be a part of this patient's care.  01/06/20 A 11/06/2019 3:58 AM

## 2019-11-06 NOTE — Progress Notes (Signed)
Inpatient Diabetes Program Recommendations  AACE/ADA: New Consensus Statement on Inpatient Glycemic Control   Target Ranges:  Prepandial:   less than 140 mg/dL      Peak postprandial:   less than 180 mg/dL (1-2 hours)      Critically ill patients:  140 - 180 mg/dL  Results for LAIBA, FUERTE (MRN [id number]) as of 11/06/2019 08:19  Ref. Range 11/06/2019 01:48 11/06/2019 02:19 11/06/2019 02:52 11/06/2019 03:24 11/06/2019 04:02 11/06/2019 05:05 11/06/2019 05:56 11/06/2019 07:15 11/06/2019 08:31  Glucose-Capillary Latest Ref Range: 70 - 99 mg/dL 845 (HH) 364 (H) 680 (H) 410 (H) 316 (H) 284 (H) 299 (H) 208 (H) 190 (H)   Results for SLYVIA, LARTIGUE (MRN [id number]) as of 11/06/2019 08:19  Ref. Range 11/06/2019 03:53  CO2 Latest Ref Range: 22 - 32 mmol/L 11 (L)  Results for KEMONI, QUESENBERRY (MRN [id number]) as of 11/06/2019 08:19  Ref. Range 11/06/2019 03:53  Anion gap Latest Ref Range: 5 - 15  17 (H)   Results for KAYSEY, BERNDT (MRN [id number]) as of 11/06/2019 08:19  Ref. Range 11/05/2019 22:08  Beta-Hydroxybutyric Acid Latest Ref Range: 0.05 - 0.27 mmol/L >8.00 (H)  Glucose Latest Ref Range: 70 - 99 mg/dL 882 (HH)    Review of Glycemic Control  Diabetes history: DM2 Outpatient Diabetes medications: Metformin 500 mg BID (not taking per home medication list) Current orders for Inpatient glycemic control: IV insulin  Inpatient Diabetes Program Recommendations:   IV insulin: IV insulin should be continued until acidosis is completely resolved (as evidenced by CO2 20 or greater, Anion Gap <10-12, and beta-hydroxybutyric acid <0.5).  Labs: Please consider ordering an A1C and Beta-hydroxybutric acid Q8H (until acidosis resolved).  Insulin at transition from IV to SQ insulin: Once acidosis is completely resolved, please consider ordering Lantus 13 units Q24H (based on 65.8 kg x 0.2 units), CBGs Q4H, and Novolog 0-9 units Q4H.  NOTE: In reviewing the chart, noted patient admitted with DKA, acute respiratory failure,  mild renal failure with severe metabolic and lactic acidosis, and with acute encephalopathy. IV insulin was started around 1:49 am today and patient is currently still on IV insulin which should be continued until acidosis is completely resolved.   In following up on patient this afternoon, noted that BMETs have not been completed as ordered and last BMET was from 3:53 am today. Communicated with Geralynn Ochs, RN regarding BMETs, electrolyte concern with potassium, and acidosis. RN contacted lab immediately to come draw labs. Went to speak with patient regarding DM hx and her husband, RN, and lab at bedside. Patient sleepy but did wake up and communicate appropriately. Patient was dx with DM2 several years ago in the Emergency Room and was prescribed Metformin. Patient took the Metformin until that prescription ran out and she never had it filled. Patient states that she had been told that if she changed her diet and lost weigh she would be able to keep her glucose controlled. Patient use to weigh 225 lbs when she was initially dx with DM and per current weight in chart she weigh about 145 lbs now.  Patient does not have a PCP and does not monitor glucose at home. Patient states that she has family history of Type 1 and Type 2 DM. Patient confirms symptoms of hyperglycemia for over 1 year (polyuria, polydipsia, blurry vision). Discussed DKA and explained basic pathophysiology and hospital treatment per DKA protocol. Explained that IV insulin would be continued until acidosis resolved and then she would be transitioned  to SQ insulin. Informed patient that diabetes coordinator would follow up with her tomorrow to discuss more about DM and outpatient DM management plan. Patient verbalized understanding of information discussed and she states that she has no questions at this time related to DM.  Thanks, Orlando Penner, RN, MSN, CDE Diabetes Coordinator Inpatient Diabetes Program [phone] (Team Pager from 8am to  5pm)

## 2019-11-06 NOTE — Progress Notes (Signed)
PHARMACY CONSULT NOTE - FOLLOW UP  Pharmacy Consult for Electrolyte Monitoring and Replacement   Recent Labs: Potassium (mmol/L)  Date Value  11/06/2019 3.1 (L)   Magnesium (mg/dL)  Date Value  00/86/7619 1.9   Calcium (mg/dL)  Date Value  50/93/2671 9.3   Albumin (g/dL)  Date Value  24/58/0998 4.2   Sodium (mmol/L)  Date Value  11/06/2019 143   Assessment: 44 year old female admitted with DKA on insulin drip. Also on bicarbonate drip for acidosis. Pharmacy to manage electrolytes.  Goal of Therapy:  Electrolytes WNL, K > 4 while on insulin drip  Plan:  --8/4 1725 K 2.8. Patient has received 2 doses of potassium 10 mEq IV out of 5 ordered. Patient is NPO. Will continue to follow closely.  --Patient is transitioning off insulin drip to SQ insulin. Anion gap is resolving slowly but CO2 remains low at 12. Patient remains on bicarb infusion. --BMPs ordered every 4 hours.  08/04 @ 2221 K+ = 3.1, below goal.  Will order KCL IV x 4 more bags ( total) and f/u K+ in am.  Valrie Hart A 11/06/2019 11:03 PM

## 2019-11-07 DIAGNOSIS — Z720 Tobacco use: Secondary | ICD-10-CM

## 2019-11-07 DIAGNOSIS — L03317 Cellulitis of buttock: Secondary | ICD-10-CM

## 2019-11-07 LAB — BASIC METABOLIC PANEL
Anion gap: 12 (ref 5–15)
Anion gap: 12 (ref 5–15)
Anion gap: 12 (ref 5–15)
Anion gap: 10 (ref 5–15)
BUN: 18 mg/dL (ref 6–20)
BUN: 18 mg/dL (ref 6–20)
BUN: 17 mg/dL (ref 6–20)
BUN: 15 mg/dL (ref 6–20)
CO2: 18 mmol/L — ABNORMAL LOW (ref 22–32)
CO2: 20 mmol/L — ABNORMAL LOW (ref 22–32)
CO2: 21 mmol/L — ABNORMAL LOW (ref 22–32)
CO2: 22 mmol/L (ref 22–32)
Calcium: 9 mg/dL (ref 8.9–10.3)
Calcium: 9.2 mg/dL (ref 8.9–10.3)
Calcium: 8.7 mg/dL — ABNORMAL LOW (ref 8.9–10.3)
Calcium: 8.3 mg/dL — ABNORMAL LOW (ref 8.9–10.3)
Chloride: 111 mmol/L (ref 98–111)
Chloride: 110 mmol/L (ref 98–111)
Chloride: 107 mmol/L (ref 98–111)
Chloride: 107 mmol/L (ref 98–111)
Creatinine, Ser: 0.62 mg/dL (ref 0.44–1.00)
Creatinine, Ser: 0.59 mg/dL (ref 0.44–1.00)
Creatinine, Ser: 0.66 mg/dL (ref 0.44–1.00)
Creatinine, Ser: 0.61 mg/dL (ref 0.44–1.00)
GFR calc Af Amer: >60 mL/min (ref >60)
GFR calc Af Amer: >60 mL/min (ref >60)
GFR calc Af Amer: >60 mL/min (ref >60)
GFR calc Af Amer: >60 mL/min (ref >60)
GFR calc non Af Amer: >60 mL/min (ref >60)
GFR calc non Af Amer: >60 mL/min (ref >60)
GFR calc non Af Amer: >60 mL/min (ref >60)
GFR calc non Af Amer: >60 mL/min (ref >60)
Glucose, Bld: 145 mg/dL — ABNORMAL HIGH (ref 70–99)
Glucose, Bld: 152 mg/dL — ABNORMAL HIGH (ref 70–99)
Glucose, Bld: 168 mg/dL — ABNORMAL HIGH (ref 70–99)
Glucose, Bld: 231 mg/dL — ABNORMAL HIGH (ref 70–99)
Potassium: 3.2 mmol/L — ABNORMAL LOW (ref 3.5–5.1)
Potassium: 3.1 mmol/L — ABNORMAL LOW (ref 3.5–5.1)
Potassium: 3.2 mmol/L — ABNORMAL LOW (ref 3.5–5.1)
Potassium: 3.3 mmol/L — ABNORMAL LOW (ref 3.5–5.1)
Sodium: 141 mmol/L (ref 135–145)
Sodium: 142 mmol/L (ref 135–145)
Sodium: 140 mmol/L (ref 135–145)
Sodium: 139 mmol/L (ref 135–145)

## 2019-11-07 LAB — HIV ANTIBODY (ROUTINE TESTING W REFLEX): HIV Screen 4th Generation wRfx: NONREACTIVE

## 2019-11-07 LAB — CBC
HCT: 37 % (ref 36.0–46.0)
Hemoglobin: 13.2 g/dL (ref 12.0–15.0)
MCH: 32.6 pg (ref 26.0–34.0)
MCHC: 35.7 g/dL (ref 30.0–36.0)
MCV: 91.4 fL (ref 80.0–100.0)
Platelets: 249 10*3/uL (ref 150–400)
RBC: 4.05 MIL/uL (ref 3.87–5.11)
RDW: 11.8 % (ref 11.5–15.5)
WBC: 11.4 10*3/uL — ABNORMAL HIGH (ref 4.0–10.5)
nRBC: 0 % (ref 0.0–0.2)

## 2019-11-07 LAB — GLUCOSE, CAPILLARY
Glucose-Capillary: 137 mg/dL — ABNORMAL HIGH (ref 70–99)
Glucose-Capillary: 228 mg/dL — ABNORMAL HIGH (ref 70–99)
Glucose-Capillary: 130 mg/dL — ABNORMAL HIGH (ref 70–99)
Glucose-Capillary: 248 mg/dL — ABNORMAL HIGH (ref 70–99)

## 2019-11-07 LAB — PROCALCITONIN: Procalcitonin: 8.76 ng/mL

## 2019-11-07 LAB — PROTIME-INR
INR: 1 (ref 0.8–1.2)
Prothrombin Time: 12.8 seconds (ref 11.4–15.2)

## 2019-11-07 LAB — MAGNESIUM: Magnesium: 2 mg/dL (ref 1.7–2.4)

## 2019-11-07 MED ORDER — INSULIN STARTER KIT- PEN NEEDLES (ENGLISH)
1.0000 | Freq: Once | Status: AC
  Administered 2019-11-07: 1
  Filled 2019-11-07: qty 1

## 2019-11-07 MED ORDER — INSULIN GLARGINE 100 UNIT/ML ~~LOC~~ SOLN: 10.0000 [IU] | Freq: Every day | SUBCUTANEOUS | Status: DC

## 2019-11-07 MED ORDER — PNEUMOCOCCAL VAC POLYVALENT 25 MCG/0.5ML IJ INJ
0.5000 mL | INJECTION | INTRAMUSCULAR | Status: AC
  Administered 2019-11-08: 0.5 mL via INTRAMUSCULAR
  Filled 2019-11-07: qty 0.5

## 2019-11-07 MED ORDER — POTASSIUM CHLORIDE CRYS ER 20 MEQ PO TBCR: 30.0000 meq | EXTENDED_RELEASE_TABLET | ORAL | Status: DC

## 2019-11-07 MED ORDER — INSULIN ASPART 100 UNIT/ML ~~LOC~~ SOLN
0.0000 [IU] | Freq: Three times a day (TID) | SUBCUTANEOUS | Status: DC
  Administered 2019-11-07: 1 [IU] via SUBCUTANEOUS
  Administered 2019-11-08 (×2): 3 [IU] via SUBCUTANEOUS
  Administered 2019-11-08: 1 [IU] via SUBCUTANEOUS
  Administered 2019-11-09 (×2): 3 [IU] via SUBCUTANEOUS
  Filled 2019-11-07 (×6): qty 1

## 2019-11-07 MED ORDER — VANCOMYCIN HCL IN DEXTROSE 1-5 GM/200ML-% IV SOLN
1000.0000 mg | Freq: Two times a day (BID) | INTRAVENOUS | Status: DC
  Administered 2019-11-07 – 2019-11-09 (×4): 1000 mg via INTRAVENOUS
  Filled 2019-11-07 (×6): qty 200

## 2019-11-07 MED ORDER — SODIUM CHLORIDE 0.9 % IV SOLN
INTRAVENOUS | Status: DC | PRN
  Administered 2019-11-07: 1000 mL via INTRAVENOUS

## 2019-11-07 MED ORDER — POTASSIUM CHLORIDE CRYS ER 20 MEQ PO TBCR
30.0000 meq | EXTENDED_RELEASE_TABLET | Freq: Once | ORAL | Status: AC
  Administered 2019-11-07: 30 meq via ORAL
  Filled 2019-11-07: qty 1

## 2019-11-07 MED ORDER — POTASSIUM CHLORIDE 10 MEQ/100ML IV SOLN
10.0000 meq | INTRAVENOUS | Status: AC
  Administered 2019-11-07 (×3): 10 meq via INTRAVENOUS
  Filled 2019-11-07 (×3): qty 100

## 2019-11-07 MED ORDER — INSULIN GLARGINE 100 UNIT/ML ~~LOC~~ SOLN
10.0000 [IU] | Freq: Two times a day (BID) | SUBCUTANEOUS | Status: DC
  Administered 2019-11-07 – 2019-11-09 (×3): 10 [IU] via SUBCUTANEOUS
  Filled 2019-11-07 (×7): qty 0.1

## 2019-11-07 MED ORDER — INSULIN ASPART 100 UNIT/ML ~~LOC~~ SOLN
0.0000 [IU] | Freq: Every day | SUBCUTANEOUS | Status: DC
  Administered 2019-11-07: 3 [IU] via SUBCUTANEOUS
  Administered 2019-11-08: 4 [IU] via SUBCUTANEOUS
  Filled 2019-11-07 (×2): qty 1

## 2019-11-07 MED ORDER — CHLORHEXIDINE GLUCONATE CLOTH 2 % EX PADS: 6.0000 | MEDICATED_PAD | Freq: Every day | CUTANEOUS | Status: DC

## 2019-11-07 MED ORDER — POTASSIUM CHLORIDE 10 MEQ/100ML IV SOLN
10.0000 meq | INTRAVENOUS | Status: AC
  Administered 2019-11-07 (×2): 10 meq via INTRAVENOUS
  Filled 2019-11-07 (×2): qty 100

## 2019-11-07 NOTE — Progress Notes (Signed)
PROGRESS NOTE    Brittany AriasSabrina C Bates  ZOX:[id number] DOB: 08/15/1975 DOA: 11/05/2019 PCP: Patient, No Pcp Per    Brief Narrative:  This is a 44 yo female with a PMH of Migraines, IBS,Type II Diabetes Mellitus, and Bronchitis.  She presented to [facility] ER on 08/3 due to previous pt complaints of not feeling well over the past several days, constipation, and generalized pain according to her husband.  She also has had an abscess on her buttocks x5 days and underwent an I&D (drained scant amount of purulent drainage) in the ER on 08/2; started on a course of doxycycline; and advised to follow-up with her PCP.  During current ER presentation pt altered with agitation and short of breath.  Lab results revealed CO@ <7, glucose 528, BUN 25, creatinine 1.31, troponin 25, lactic acid 4.2, osmolality 337, wbc 23.8, beta hydoxy >8.00, and vbg pH 6.91/pCO2 19/bicarb 3.8.  Pt received cefepime, droperidol, 2L LR bolus, 2L NS, flagyl, morphine, potassium, and vancomycin.  Insulin gtt initiated due to DKA.  According to pts husband the pt has been managing her diabetes with weight management/healthy eating and has not required medication.  She was subsequently admitted to ICU for additional workup and treatment per PCCM team and started on vancomycin,cefepime nad flagyl.  8/5: Patient is an ICU transfer after being treated for diabetic ketoacidosis and Left gluteal abscesses. Patient was started on IV antibiotics.  Currently vitals are stable anion gap is closed, she is tolerating a diet.  General surgery consulted and a CT abdomen and pelvis showed no obvious abscess.  And plan currently is to continue patient and monitor on IV antibiotic therapy.we will start pt on Lantus as her a1c is not at goal.  Assessment & Plan:   Principal Problem:   DKA (diabetic ketoacidoses) (HCC): Pt started on insulin gtt and AG closed was transitioned to long acting insulin.  has been started on diet. SSI/A1C.   Tobacco  abuse: Counseled pt on tobacco cessation.     Cellulitis, gluteal, left: Pt continued on iv abx.  Blood cx neg x 2 days.     DVT prophylaxis:  Code Status:  Full Code Family Communication:  None at bedside.  Disposition Plan: Home with dressing changes.  Consultants:   General Surgery.  Procedures: N/A  Antimicrobials:  Cefepime day 3 .   Subjective: Pt is stable and doing better today will d/c insulin drip and change to sub long acting insulin.  Objective: Vitals:   11/07/19 0500 11/07/19 1200 11/07/19 1340 11/07/19 1713  BP: 121/85 116/69 (!) 141/83 124/73  Pulse: 100 94 (!) 105 (!) 107  Resp: 15 (!) 22 18 16   Temp: 98.8 F (37.1 C) 99.4 F (37.4 C) 98.1 F (36.7 C) 98.1 F (36.7 C)  TempSrc:   Oral Oral  SpO2:  99% 100% 100%  Weight:      Height:        Intake/Output Summary (Last 24 hours) at 11/07/2019 1758 Last data filed at 11/07/2019 1402 Gross per 24 hour  Intake 261.49 ml  Output 1925 ml  Net -1663.51 ml   Filed Weights   11/05/19 2123  Weight: 65.8 kg    Examination:  General exam: Appears calm and comfortable  Respiratory system: Clear to auscultation. Respiratory effort normal. Cardiovascular system: S1 & S2 heard, RRR. No JVD, murmurs, rubs, gallops or clicks. No pedal edema. Gastrointestinal system: Abdomen is nondistended, soft and nontender. No organomegaly or masses felt. Normal bowel sounds heard. Central nervous  system: Alert and oriented. No focal neurological deficits. Extremities: Symmetric 5 x 5 power. Skin: No rashes, lesions or ulcers Psychiatry: Judgement and insight appear normal. Mood & affect appropriate.     Data Reviewed: I have personally reviewed following labs and imaging studies  CBC: Recent Labs  Lab 11/05/19 2208 11/06/19 0353 11/07/19 0516  WBC 23.8* 14.4* 11.4*  NEUTROABS  --  11.6*  --   HGB 16.2* 15.4* 13.2  HCT 51.4* 47.7* 37.0  MCV 101.6* 100.2* 91.4  PLT 457* 278 249   Basic Metabolic  Panel: Recent Labs  Lab 11/06/19 0353 11/06/19 1422 11/06/19 1725 11/06/19 2221 11/07/19 0233 11/07/19 0516 11/07/19 1359  NA 139   < > 141 143 141 142 140  K 3.0*   < > 2.8* 3.1* 3.2* 3.1* 3.2*  CL 111   < > 114* 114* 111 110 107  CO2 11*   < > 12* 17* 18* 20* 21*  GLUCOSE 382*   < > 190* 124* 145* 152* 168*  BUN 27*   < > 20 19 18 18 17   CREATININE 1.15*   < > 0.77 0.53 0.62 0.59 0.66  CALCIUM 8.8*   < > 9.2 9.3 9.0 9.2 8.7*  MG 1.9  --   --   --   --   --  2.0   < > = values in this interval not displayed.   GFR: Estimated Creatinine Clearance: 84.9 mL/min (by C-G formula based on SCr of 0.66 mg/dL). Liver Function Tests: Recent Labs  Lab 11/05/19 2208  AST 14*  ALT 13  ALKPHOS 135*  BILITOT 1.8*  PROT 8.5*  ALBUMIN 4.2   No results for input(s): LIPASE, AMYLASE in the last 168 hours. No results for input(s): AMMONIA in the last 168 hours. Coagulation Profile: Recent Labs  Lab 11/07/19 0233  INR 1.0   Cardiac Enzymes: No results for input(s): CKTOTAL, CKMB, CKMBINDEX, TROPONINI in the last 168 hours. BNP (last 3 results) No results for input(s): PROBNP in the last 8760 hours. HbA1C: Recent Labs    11/06/19 0353  HGBA1C 13.5*   CBG: Recent Labs  Lab 11/06/19 2016 11/06/19 2255 11/07/19 0728 11/07/19 1149 11/07/19 1641  GLUCAP 146* 109* 137* 228* 130*   Lipid Profile: No results for input(s): CHOL, HDL, LDLCALC, TRIG, CHOLHDL, LDLDIRECT in the last 72 hours. Thyroid Function Tests: No results for input(s): TSH, T4TOTAL, FREET4, T3FREE, THYROIDAB in the last 72 hours. Anemia Panel: No results for input(s): VITAMINB12, FOLATE, FERRITIN, TIBC, IRON, RETICCTPCT in the last 72 hours. Sepsis Labs: Recent Labs  Lab 11/05/19 2208 11/06/19 0010 11/06/19 0353 11/07/19 0516  PROCALCITON  --   --  3.60 8.76  LATICACIDVEN 4.2* 2.7*  --   --     Recent Results (from the past 240 hour(s))  SARS Coronavirus 2 by RT PCR (hospital order, performed in  [facility], [place] hospital lab) Nasopharyngeal Nasopharyngeal Swab     Status: None   Collection Time: 11/05/19 11:09 PM   Specimen: Nasopharyngeal Swab  Result Value Ref Range Status   SARS Coronavirus 2 NEGATIVE NEGATIVE Final    Comment: (NOTE) SARS-CoV-2 target nucleic acids are NOT DETECTED.  The SARS-CoV-2 RNA is generally detectable in upper and lower respiratory specimens during the acute phase of infection. The lowest concentration of SARS-CoV-2 viral copies this assay can detect is 250 copies / mL. A negative result does not preclude SARS-CoV-2 infection and should not be used as the sole basis for treatment or  other patient management decisions.  A negative result may occur with improper specimen collection / handling, submission of specimen other than nasopharyngeal swab, presence of viral mutation(s) within the areas targeted by this assay, and inadequate number of viral copies (<250 copies / mL). A negative result must be combined with clinical observations, patient history, and epidemiological information.  Fact Sheet for Patients:   BoilerBrush.com.cy  Fact Sheet for Healthcare Providers: https://pope.com/  This test is not yet approved or  cleared by the [place] FDA and has been authorized for detection and/or diagnosis of SARS-CoV-2 by FDA under an Emergency Use Authorization (EUA).  This EUA will remain in effect (meaning this test can be used) for the duration of the COVID-19 declaration under Section 564(b)(1) of the Act, 21 U.S.C. section 360bbb-3(b)(1), unless the authorization is terminated or revoked sooner.  Performed at [facility], [address] [place], [place] [address]   Blood culture (routine x 2)     Status: None (Preliminary result)   Collection Time: 11/06/19 12:10 AM   Specimen: BLOOD LEFT HAND  Result Value Ref Range Status   Specimen Description BLOOD LEFT HAND  Final   Special  Requests   Final    BOTTLES DRAWN AEROBIC AND ANAEROBIC Blood Culture adequate volume   Culture   Final    NO GROWTH 1 DAY Performed at [facility], [address], [place], [place] [address]    Report Status PENDING  Incomplete         Radiology Studies: CT ABDOMEN PELVIS W CONTRAST  Result Date: 11/06/2019 CLINICAL DATA:  Code sepsis EXAM: CT ABDOMEN AND PELVIS WITH CONTRAST TECHNIQUE: Multidetector CT imaging of the abdomen and pelvis was performed using the standard protocol following bolus administration of intravenous contrast. CONTRAST:  23mL OMNIPAQUE IOHEXOL 300 MG/ML  SOLN COMPARISON:  None. FINDINGS: Lower chest: Lung bases demonstrate hazy dependent atelectasis. 3 mm right lung base nodule, series 4, image number 11. No consolidation or pleural effusion. Motion degradation. Hepatobiliary: No focal liver abnormality is seen. No gallstones, gallbladder wall thickening, or biliary dilatation. Pancreas: Unremarkable. No pancreatic ductal dilatation or surrounding inflammatory changes. Spleen: Normal in size without focal abnormality. Adrenals/Urinary Tract: Right adrenal gland is normal. 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 Nonvisualized left kidney. Compensatory hypertrophy of the right kidney. Multiple punctate intrarenal stones on the right. The bladder is unremarkable. Stomach/Bowel: Stomach is within normal limits. Appendix appears normal. No evidence of bowel wall thickening, distention, or inflammatory changes. Vascular/Lymphatic: Nonaneurysmal aorta with mild aortic atherosclerosis. No suspicious nodes Reproductive: Multiple hypoenhancing masses within the uterus, likely fibroids. No suspicious adnexal mass Other: Negative for free air or free fluid. Musculoskeletal: No acute or significant osseous findings. IMPRESSION: 1. No CT evidence for acute intra-abdominal or pelvic abnormality. 2. 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 Compensatory hypertrophy of the right kidney. Multiple punctate intrarenal stones on the right. 3. 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 4. Fibroid uterus. 5. 3 mm right lung base nodule. No follow-up needed if patient is low-risk. Non-contrast chest CT can be considered in 12 months if patient is high-risk. This recommendation follows the consensus statement: Guidelines for Management of Incidental Pulmonary Nodules Detected on CT Images: From the Fleischner Society 2017; Radiology 2017; 284:228-243. 6. Aortic atherosclerosis. Aortic Atherosclerosis (ICD10-I70.0). Electronically Signed   By: Jasmine Pang M.D.   On: 11/06/2019 01:52   DG Chest Port 1 View  Result Date: 11/05/2019  CLINICAL DATA:  Abscess to the buttocks for 4 days. Shortness of breath and hyperventilating. EXAM: PORTABLE CHEST 1 VIEW COMPARISON:  11/30/2018 FINDINGS: Shallow inspiration. Heart size and pulmonary vascularity are normal for technique. Lungs are clear. No pleural effusions. No pneumothorax. Mediastinal contours appear intact. IMPRESSION: No active disease. Electronically Signed   By: Burman Nieves M.D.   On: 11/05/2019 22:57        Scheduled Meds: . [START ON 11/08/2019] Chlorhexidine Gluconate Cloth  6 each Topical Daily  . heparin  5,000 Units Subcutaneous Q8H  . insulin aspart  0-5 Units Subcutaneous QHS  . insulin aspart  0-9 Units Subcutaneous TID WC  . insulin glargine  10 Units Subcutaneous Q12H  . [START ON 11/08/2019] pneumococcal 23 valent vaccine  0.5 mL Intramuscular Tomorrow-1000   Continuous Infusions: . sodium chloride 1,000 mL (11/07/19 1400)  . ceFEPime (MAXIPIME) IV 2 g (11/07/19 1738)  . famotidine (PEPCID) IV 20 mg (11/07/19 1751)  . vancomycin       LOS: 1 day     Gertha Calkin, MD [facility] Pager [phone] If 7PM-7AM, please contact night-coverage [email or web address] Password [facility] 11/07/2019, 5:58 PM

## 2019-11-07 NOTE — Progress Notes (Signed)
PHARMACY CONSULT NOTE - FOLLOW UP  Pharmacy Consult for Electrolyte Monitoring and Replacement   Recent Labs: Potassium (mmol/L)  Date Value  11/07/2019 3.1 (L)   Magnesium (mg/dL)  Date Value  17/71/1657 1.9   Calcium (mg/dL)  Date Value  90/38/3338 9.2   Albumin (g/dL)  Date Value  32/91/9166 4.2   Sodium (mmol/L)  Date Value  11/07/2019 142   Assessment: 44 year old female admitted with DKA on insulin drip. Also on bicarbonate drip for acidosis. Pharmacy to manage electrolytes.  Goal of Therapy:  Electrolytes WNL, K > 4 while on insulin drip  Plan:  8/5 K 3.1. Will order KCL IV x 5.  F/U BMP already ordered for 1400. Pharmacy will continue to monitor and replace as needed.   Gardner Candle, PharmD, BCPS Clinical Pharmacist 11/07/2019 7:37 AM

## 2019-11-07 NOTE — Progress Notes (Signed)
[facility] SURGICAL PROGRESS NOTE (cpt [phone])  Hospital Day(s): 1.   Interval History: Patient seen and examined, no acute events or new complaints overnight. Patient reports she is feeling overall better this morning. She denied any fever, chills, CP, SOB, nausea, or emesis. Her pain in her left gluteal crease at site of I&D is improved. Leukocytosis continues to improve to 11.4K. Renal function is normal with sCr - 0.59. Hyperglycemia is improved to 152. She is hypokalemic to 3.1 this morning. She continues on cefepime and vancomycin for presumed sepsis. No other complaints.   Review of Systems:  Constitutional: denies fever, chills  HEENT: denies cough or congestion  Respiratory: denies any shortness of breath  Cardiovascular: denies chest pain or palpitations  Gastrointestinal: denies abdominal pain, N/V, or diarrhea/and bowel function as per interval history Genitourinary: denies burning with urination or urinary frequency Musculoskeletal: denies pain, decreased motor or sensation Integumentary: + Left gluteal crease abscess, s/p I&D  Vital signs in last 24 hours: [min-max] current  Temp:  [97.9 F (36.6 C)-99.6 F (37.6 C)] 98.8 F (37.1 C) (08/05 0500) Pulse Rate:  [100-110] 100 (08/05 0500) Resp:  [13-28] 15 (08/05 0500) BP: (93-137)/(55-85) 121/85 (08/05 0500) SpO2:  [97 %-100 %] 97 % (08/05 0230)     Height: 5\' 6"  (167.6 cm) Weight: 65.8 kg BMI (Calculated): 23.41   Intake/Output last 2 shifts:  08/04 0701 - 08/05 0700 In: 1559.5 [I.V.:61.5; IV Piggyback:1498] Out: 1050 [Urine:1050]   Physical Exam:  Constitutional: alert, cooperative and no distress  HENT: normocephalic without obvious abnormality, mucus membranes are dry Eyes: PERRL, EOM's grossly intact and symmetric  Respiratory: breathing non-labored at rest  Cardiovascular: regular rate and sinus rhythm  Gastrointestinal: Soft, non-tender, and non-distended Musculoskeletal: No edema or wounds,  motor and sensation grossly intact, NT  Integumentary: I&D site to the left superior gluteal crease, erythema improved, some induration, no gross fluctuance or crepitus   Labs:  CBC Latest Ref Rng & Units 11/06/2019 11/05/2019  WBC 4.0 - 10.5 K/uL 14.4(H) 23.8(H)  Hemoglobin 12.0 - 15.0 g/dL 15.4(H) 16.2(H)  Hematocrit 36 - 46 % 47.7(H) 51.4(H)  Platelets 150 - 400 K/uL 278 457(H)   CMP Latest Ref Rng & Units 11/07/2019 11/07/2019 11/06/2019  Glucose 70 - 99 mg/dL 01/06/2020) 856(D) 149(F)  BUN 6 - 20 mg/dL 18 18 19   Creatinine 0.44 - 1.00 mg/dL 026(V 7.85  Sodium 135 - 145 mmol/L 142 141 143  Potassium 3.5 - 5.1 mmol/L 3.1(L) 3.2(L) 3.1(L)  Chloride 98 - 111 mmol/L 110 111 114(H)  CO2 22 - 32 mmol/L 20(L) 18(L) 17(L)  Calcium 8.9 - 10.3 mg/dL 9.2 9.0 9.3  Total Protein 6.5 - 8.1 g/dL - - -  Total Bilirubin 0.3 - 1.2 mg/dL - - -  Alkaline Phos 38 - 126 U/L - - -  AST 15 - 41 U/L - - -  ALT 0 - 44 U/L - - -     Imaging studies: No new pertinent imaging studies   Assessment/Plan: (ICD-10's: L02.31) 44 y.o. female with left gluteal cleft abscess s/p I&D in the ED on 08/02 without further evidence of abscess or necrotizing infection on examination or imaging, complicated by DKA.   - Okay for diet this morning; will make NPO after midnight as precaution  - No evidence of undrained abscess or necrotizing infection on examination or imaging; we will continue to follow to ensure this does not develop. May repeat CT in the morning              -  Continue IV ABx             - Pain control prn             - Monitor leukocytosis; fever curce             - Further management per PCCM team  All of the above findings and recommendations were discussed with the patient, and the medical team, and all of patient's questions were answered to her expressed satisfaction.  -- Lynden Oxford, PA-C [facility] 11/07/2019, 7:34 AM [phone] M-F: 7am - 4pm

## 2019-11-07 NOTE — Assessment & Plan Note (Addendum)
AG is closed will repeat bmp and start pt on Lantus bid and SSI> Carb consistent diet.

## 2019-11-07 NOTE — Progress Notes (Addendum)
Pharmacy Antibiotic Note  Brittany Bates is a 44 y.o. female admitted on 11/05/2019 with wound infection and DKA.  Patient S/P I &D  Of left gluteal abscess on 8/2 and discharged home on doxycycline. Pharmacy has been consulted for Cefepime and Vancomycin dosing.   Per surgery team, No evidence of undrained abscess or necrotizing infection on examination.  Plan: Day 2 IV antibiotic. Continue Cefepime 2gm IV q8hrs  Adjust vancomycin to 1000mg  IV 12 hours based on Tucker vancomycin nomogram.   Height: 5\' 6"  (167.6 cm) Weight: 65.8 kg (145 lb) IBW/kg (Calculated) : 59.3  Temp (24hrs), Avg:98.7 F (37.1 C), Min:97.9 F (36.6 C), Max:99.6 F (37.6 C)  Recent Labs  Lab 11/05/19 2208 11/05/19 2208 11/06/19 0010 11/06/19 0353 11/06/19 0353 11/06/19 1422 11/06/19 1725 11/06/19 2221 11/07/19 0233 11/07/19 0516  WBC 23.8*  --   --  14.4*  --   --   --   --   --   --   CREATININE 1.31*   < >  --  1.15*   < > 0.79 0.77 0.53 0.62 0.59  LATICACIDVEN 4.2*  --  2.7*  --   --   --   --   --   --   --    < > = values in this interval not displayed.    Estimated Creatinine Clearance: 84.9 mL/min (by C-G formula based on SCr of 0.59 mg/dL).    Allergies  Allergen Reactions   Latex Hives   Morphine And Related Itching    Antimicrobials this admission: Vancomycin 8/4 >> Cefepime 8/4 >>  Microbiology results: 8/4 BCx: pending  Thank you for allowing pharmacy to be a part of this patients care.  10/4, PharmD, BCPS Clinical Pharmacist 11/07/2019 8:00 AM

## 2019-11-07 NOTE — Progress Notes (Addendum)
PHARMACY CONSULT NOTE - FOLLOW UP  Pharmacy Consult for Electrolyte Monitoring and Replacement   Recent Labs: Potassium (mmol/L)  Date Value  11/07/2019 3.2 (L)   Magnesium (mg/dL)  Date Value  80/32/1224 2.0   Calcium (mg/dL)  Date Value  82/50/0370 8.7 (L)   Albumin (g/dL)  Date Value  48/88/9169 4.2   Sodium (mmol/L)  Date Value  11/07/2019 140   Assessment: 44 year old female admitted with DKA on insulin drip. Also on bicarbonate drip for acidosis. Pharmacy to manage electrolytes.  Goal of Therapy:  Electrolytes WNL  Plan:  8/5 K 3.2. KCL IV x 2 doses already ordered.   Will order KCL x 1 dose.   Will F/U with AM labs and continue to replace as needed.   Gardner Candle, PharmD, BCPS Clinical Pharmacist 11/07/2019 2:54 PM

## 2019-11-07 NOTE — Progress Notes (Addendum)
Inpatient Diabetes Program Recommendations  AACE/ADA: New Consensus Statement on Inpatient Glycemic Control   Target Ranges:  Prepandial:   less than 140 mg/dL      Peak postprandial:   less than 180 mg/dL (1-2 hours)      Critically ill patients:  140 - 180 mg/dL   Results for Brittany Bates, Brittany Bates (MRN [id number]) as of 11/07/2019 07:34  Ref. Range 11/06/2019 07:15 11/06/2019 08:31 11/06/2019 10:26 11/06/2019 11:51 11/06/2019 13:47 11/06/2019 15:12 11/06/2019 16:14 11/06/2019 17:20 11/06/2019 18:53 11/06/2019 20:16 11/06/2019 22:55 11/07/2019 07:28  Glucose-Capillary Latest Ref Range: 70 - 99 mg/dL 208 (H) 190 (H) 190 (H) 166 (H) 237 (H) 180 (H) 194 (H) 181 (H) 166 (H) 146 (H) 109 (H) 137 (H)  Results for TYAIRA, HEWARD (MRN [id number]) as of 11/07/2019 07:34  Ref. Range 11/06/2019 03:53  Hemoglobin A1C Latest Ref Range: 4.8 - 5.6 % 13.5 (H)   Review of Glycemic Control  Diabetes history: DM2 Outpatient Diabetes medications: None Current orders for Inpatient glycemic control: Levemir 8 units Q12H, Novolog 2-6 units Q4H  Inpatient Diabetes Program Recommendations:   Insulin-Please discontinue orders under ICU Glycemic Control Phase 3 order set and use Glycemic Control order set to order Levemir 8 units Q12H, CBGs Q4H (AC&HS if diet will be ordered), Novolog 0-9 units Q4H (or AC&HS if diet will be ordered).   HbgA1C: A1C 13.5% on 11/06/19 indicating an average glucose of 341 mg/dl over the past 2-3 months. Anticipate patient will require insulin for outpatient DM management.  NOTE: In reviewing chart, noted patient received Levemir 8 units at 17:59 on 11/06/19 and glucose 137 mg/dl this morning (with Q4H Novolog correction). A1C 13.5% on 11/06/19; therefore, anticipate patient will need insulin for DM management outpatient. Ordered Insulin Starter kit, patient education by bedside RNs to educate on insulin, and RD consult for diet education. Will plan to see patient again today.  Addendum 11/07/19_0 :35-Met with patient again at  bedside and her husband came in the room during our conversation.  Discussed A1C results (13.5% on 11/06/19) and reviewed what an A1C is and informed patient that her current A1C indicates an average glucose of 341 mg/dl over the past 2-3 months. Patient is aware that her A1C is high and notes that a normal A1C should be 7% or less. Reviewed basic pathophysiology of DM Type 2, basic home care, importance of checking CBGs and maintaining good CBG control to prevent long-term and short-term complications. Reviewed glucose and A1C goals.  Reviewed signs and symptoms of hyperglycemia and hypoglycemia along with treatment for both. Discussed impact of nutrition, exercise, stress, sickness, and medications on diabetes control. Reviewed Living Well with diabetes booklet and encouraged patient to read through entire book. Informed patient that she may to have insulin prescribed for DM management given her A1C results.  Explained that she is currently order Levemir and Novolog insulin. Discuss Levemir and Novolog insulin.  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 Patient states that her grandmother has DM and uses insulin pens at home. She would prefer to use insulin pens at home if discharged on insulin.  Patient reports that her husband will help her with insulin injections if she is discharged on insulin. However, her husband states that he leaves for work at [employer] and patient is home alone most of the day and she will need to learn how to give herself insulin injections.  Patient states that she will try  to give herself her own insulin injections. Informed patient that I would ask nursing to allow her to self administer insulin injections starting with her next scheduled insulin dose.  Educated patient and spouse on insulin pen use at home. Reviewed contents of insulin flexpen starter kit. Reviewed all steps of insulin pen including attachment of needle, 2-unit air  shot, dialing up dose, giving injection, removing needle, disposal of sharps, storage of unused insulin, disposal of insulin etc. Patient able to provide successful return demonstration. Patient states she is comfortable with the insulin pen as she has seen her grandmother use it many times.  Asked patient to check glucose 3-4 times per day and to keep a log book of glucose readings. Explained how the doctor she follows up with can use the log book to continue to make adjustments with DM medications if needed. Reminded patient that RN will be asking her to self-administer insulin to ensure proper technique and ability to administer self insulin shots.   Patient verbalized understanding of information discussed and she states that she has no further questions at this time related to diabetes.   RNs to provide ongoing basic DM education at bedside with this patient and engage patient to actively check blood glucose and administer insulin injections. At time of discharge, please provide Rx for: glucose monitoring kit, and if discharged on insulin she would prefer insulin pens.    Thanks, Barnie Alderman, RN, MSN, CDE Diabetes Coordinator Inpatient Diabetes Program [phone] (Team Pager from 8am to 5pm)

## 2019-11-07 NOTE — TOC Initial Note (Addendum)
Transition of Care [facility]) - Initial/Assessment Note    Patient Details  Name: Brittany Bates MRN: [id number] Date of Birth: 23-Nov-1975  Transition of [facility]) CM/SW Contact:    Anselm Pancoast, RN Phone Number: 11/07/2019, 11:42 AM  Clinical Narrative:                 Spoke with patient at bedside. Patient was tearful upon entering the room and states she is tired of being sick and wants to go home. Discussed options for PCP with current insurance. Provided information for [facility] including name, address and phone number. Patient states she is also interested in quitting smoking after she gets home. Lives with husband who works full time at [employer]. 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 Patient is independent with all ADL's and states she has no knowledge regarding managing her Diabetes. Reminded patient she had met with Diabetic Coordinator and had many resources for follow up. Patient very appreciative of assistance and states she has no other needs except wanting to get to her hospital room. Discussed with patient possibility of some home health for nursing to assist with managing diabetes and getting engaged with PCP. Patient agreed. Discussed case with Corene Cornea, [place] for follow up at discharge.  Expected Discharge Plan: Home/Self Care Barriers to Discharge: Continued Medical Work up   Patient Goals and CMS Choice Patient states their goals for this hospitalization and ongoing recovery are:: get back home with a new doctor      Expected Discharge Plan and Services Expected Discharge Plan: Home/Self Care       Living arrangements for the past 2 months: Single Family Home                                      Prior Living Arrangements/Services Living arrangements for the past 2 months: Single Family Home Lives with:: Spouse Patient language and need for interpreter reviewed:: Yes Do you feel safe going  back to the place where you live?: Yes      Need for Family Participation in Patient Care: Yes (Comment) Care giver support system in place?: Yes (comment)   Criminal Activity/Legal Involvement Pertinent to Current Situation/Hospitalization: No - Comment as needed  Activities of Daily Living      Permission Sought/Granted Permission sought to share information with : Case Manager Permission granted to share information with : Yes, Verbal Permission Granted  Share Information with NAME: TOC Department           Emotional Assessment Appearance:: Appears stated age Attitude/Demeanor/Rapport: Crying, Ambitious, Engaged Affect (typically observed): Accepting Orientation: : Oriented to Place, Oriented to Self, Oriented to  Time, Oriented to Situation Alcohol / Substance Use: Not Applicable Psych Involvement: No (comment)  Admission diagnosis:  DKA (diabetic ketoacidoses) ([place] [address]) [E11.10] Patient Active Problem List   Diagnosis Date Noted  . DKA (diabetic ketoacidoses) ([place]) 11/06/2019   PCP:  Patient, No Pcp Per Pharmacy:   [facility] [address] [place] [address] Phone: [phone] Fax: [phone]     Social Determinants of Health (SDOH) Interventions    Readmission Risk Interventions No flowsheet data found.

## 2019-11-08 ENCOUNTER — Inpatient Hospital Stay: Payer: BLUE CROSS/BLUE SHIELD

## 2019-11-08 DIAGNOSIS — L03317 Cellulitis of buttock: Secondary | ICD-10-CM

## 2019-11-08 DIAGNOSIS — E0810 Diabetes mellitus due to underlying condition with ketoacidosis without coma: Secondary | ICD-10-CM

## 2019-11-08 DIAGNOSIS — Z720 Tobacco use: Secondary | ICD-10-CM

## 2019-11-08 LAB — COMPREHENSIVE METABOLIC PANEL
ALT: 10 U/L (ref 0–44)
AST: 11 U/L — ABNORMAL LOW (ref 15–41)
Albumin: 2.7 g/dL — ABNORMAL LOW (ref 3.5–5.0)
Alkaline Phosphatase: 70 U/L (ref 38–126)
Anion gap: 15 (ref 5–15)
BUN: 14 mg/dL (ref 6–20)
CO2: 16 mmol/L — ABNORMAL LOW (ref 22–32)
Calcium: 8.7 mg/dL — ABNORMAL LOW (ref 8.9–10.3)
Chloride: 109 mmol/L (ref 98–111)
Creatinine, Ser: 0.54 mg/dL (ref 0.44–1.00)
GFR calc Af Amer: >60 mL/min (ref >60)
GFR calc non Af Amer: >60 mL/min (ref >60)
Glucose, Bld: 218 mg/dL — ABNORMAL HIGH (ref 70–99)
Potassium: 3.8 mmol/L (ref 3.5–5.1)
Sodium: 140 mmol/L (ref 135–145)
Total Bilirubin: 1.2 mg/dL (ref 0.3–1.2)
Total Protein: 5.9 g/dL — ABNORMAL LOW (ref 6.5–8.1)

## 2019-11-08 LAB — GLUCOSE, CAPILLARY
Glucose-Capillary: 177 mg/dL — ABNORMAL HIGH (ref 70–99)
Glucose-Capillary: 208 mg/dL — ABNORMAL HIGH (ref 70–99)
Glucose-Capillary: 150 mg/dL — ABNORMAL HIGH (ref 70–99)
Glucose-Capillary: 247 mg/dL — ABNORMAL HIGH (ref 70–99)
Glucose-Capillary: 313 mg/dL — ABNORMAL HIGH (ref 70–99)

## 2019-11-08 LAB — CBC WITH DIFFERENTIAL/PLATELET
Abs Immature Granulocytes: 0.1 10*3/uL — ABNORMAL HIGH (ref 0.00–0.07)
Basophils Absolute: 0.1 10*3/uL (ref 0.0–0.1)
Basophils Relative: 1 %
Eosinophils Absolute: 0.1 10*3/uL (ref 0.0–0.5)
Eosinophils Relative: 1 %
HCT: 38.5 % (ref 36.0–46.0)
Hemoglobin: 13.8 g/dL (ref 12.0–15.0)
Immature Granulocytes: 1 %
Lymphocytes Relative: 15 %
Lymphs Abs: 1.5 10*3/uL (ref 0.7–4.0)
MCH: 32.8 pg (ref 26.0–34.0)
MCHC: 35.8 g/dL (ref 30.0–36.0)
MCV: 91.4 fL (ref 80.0–100.0)
Monocytes Absolute: 0.9 10*3/uL (ref 0.1–1.0)
Monocytes Relative: 9 %
Neutro Abs: 7.1 10*3/uL (ref 1.7–7.7)
Neutrophils Relative %: 73 %
Platelets: 228 10*3/uL (ref 150–400)
RBC: 4.21 MIL/uL (ref 3.87–5.11)
RDW: 12 % (ref 11.5–15.5)
WBC: 9.7 10*3/uL (ref 4.0–10.5)
nRBC: 0 % (ref 0.0–0.2)

## 2019-11-08 LAB — C-REACTIVE PROTEIN: CRP: 15.1 mg/dL — ABNORMAL HIGH (ref <1.0)

## 2019-11-08 LAB — SEDIMENTATION RATE: Sed Rate: 80 mm/hr — ABNORMAL HIGH (ref 0–20)

## 2019-11-08 LAB — PHOSPHORUS: Phosphorus: 1.8 mg/dL — ABNORMAL LOW (ref 2.5–4.6)

## 2019-11-08 LAB — MAGNESIUM: Magnesium: 2.1 mg/dL (ref 1.7–2.4)

## 2019-11-08 MED ORDER — ADULT MULTIVITAMIN W/MINERALS CH
1.0000 | ORAL_TABLET | Freq: Every day | ORAL | Status: DC
  Administered 2019-11-09: 1 via ORAL
  Filled 2019-11-08: qty 1

## 2019-11-08 MED ORDER — ENSURE MAX PROTEIN PO LIQD
11.0000 [oz_av] | Freq: Two times a day (BID) | ORAL | Status: DC
  Administered 2019-11-08: 11 [oz_av] via ORAL
  Filled 2019-11-08: qty 330

## 2019-11-08 MED ORDER — POTASSIUM PHOSPHATES 15 MMOLE/5ML IV SOLN
20.0000 mmol | Freq: Once | INTRAVENOUS | Status: AC
  Administered 2019-11-08: 20 mmol via INTRAVENOUS
  Filled 2019-11-08: qty 6.67

## 2019-11-08 NOTE — Progress Notes (Signed)
Initial Nutrition Assessment  DOCUMENTATION CODES:   Not applicable  INTERVENTION:   Ensure Max protein supplement BID, each supplement provides 150kcal and 30g of protein.  MVI daily   Pt at high refeed risk  Diabetes diet education   NUTRITION DIAGNOSIS:   Inadequate oral intake related to acute illness as evidenced by per patient/family report.  GOAL:   Patient will meet greater than or equal to 90% of their needs  MONITOR:   PO intake, Supplement acceptance, Labs, Weight trends, Skin, I & O's  REASON FOR ASSESSMENT:   Consult Diet education  ASSESSMENT:   44 y.o. female with h/o IBS, DM, bronchitis, left gluteal cleft abscess s/p I&D in the ED on 08/02 who is admitted with gluteal pain and DKA   Met with pt in room today. Pt reports good appetite and oral intake at baseline but reports that her appetite has been decreased over the past few days. Pt reports eating 50% of her lunch today which included a hamburger and fries. Per chart, pt with weight gain pta. RD discussed with pt the importance of adequate nutrition needed for wound healing and to preserve lean muscle. Pt is willing to drink chocolate Ensure Max. RD will add supplements and MVI to help pt meet her estimated needs and to encourage wound healing. Of note, pt with poor dentition and is lactose intolerant.   RD consulted for nutrition education regarding diabetes.   Lab Results  Component Value Date   HGBA1C 13.5 (H) 11/06/2019    RD provided "Nutrition and Type II Diabetes" handout from the Academy of Nutrition and Dietetics. Discussed different food groups and their effects on blood sugar, emphasizing carbohydrate-containing foods. Provided list of carbohydrates and recommended serving sizes of common foods.  Discussed importance of controlled and consistent carbohydrate intake throughout the day. Provided examples of ways to balance meals/snacks and encouraged intake of high-fiber, whole grain  complex carbohydrates. Teach back method used.  Medications reviewed and include: Labs reviewed:  K 3.8 wnl, P 1.8(L), Mg 2.1 wnl cbgs- 177, 208, 150 x 24 hrs AIC 13.5(H)- 8/4  NUTRITION - FOCUSED PHYSICAL EXAM:    Most Recent Value  Orbital Region No depletion  Upper Arm Region No depletion  Thoracic and Lumbar Region No depletion  Buccal Region No depletion  Temple Region No depletion  Clavicle Bone Region No depletion  Clavicle and Acromion Bone Region No depletion  Scapular Bone Region No depletion  Dorsal Hand No depletion  Patellar Region No depletion  Anterior Thigh Region No depletion  Posterior Calf Region No depletion  Edema (RD Assessment) None  Hair Reviewed  Eyes Reviewed  Mouth Reviewed  Skin Reviewed  Nails Reviewed     Diet Order:   Diet Order            Diet Carb Modified Fluid consistency: Thin; Room service appropriate? Yes  Diet effective now                EDUCATION NEEDS:   Education needs have been addressed  Skin:  Skin Assessment: Reviewed RN Assessment (Closed incision buttocks)  Last BM:  8/1  Height:   Ht Readings from Last 1 Encounters:  11/05/19 _0  (1.676 m)    Weight:   Wt Readings from Last 1 Encounters:  11/05/19 65.8 kg    Ideal Body Weight:  59 kg  BMI:  Body mass index is 23.4 kg/m.  Estimated Nutritional Needs:   Kcal:  1600-1800kcal/day  Protein:  80-90g/day  Fluid:  >1.8L/day  Koleen Distance MS, RD, LDN Please refer to [facility] for RD and/or RD on-call/weekend/after hours pager

## 2019-11-08 NOTE — Progress Notes (Addendum)
PHARMACY CONSULT NOTE - FOLLOW UP  Pharmacy Consult for Electrolyte Monitoring and Replacement   Recent Labs: Potassium (mmol/L)  Date Value  11/07/2019 3.3 (L)   Magnesium (mg/dL)  Date Value  70/76/1518 2.0   Calcium (mg/dL)  Date Value  34/37/3578 8.3 (L)   Albumin (g/dL)  Date Value  97/84/7841 4.2   Sodium (mmol/L)  Date Value  11/07/2019 139   Assessment: 44 year old female admitted with DKA and was placed on an insulin drip, and also a bicarbonate drip for acidosis. Pharmacy to manage electrolytes.  8/6 Both the insulin drip and bicarbonate drip have been discontinued. Pt is now on Lantus and SSI. K 3.2>>3.8 Mg 2.0>>2.1 Phos 1.8  Goal of Therapy:  Electrolytes WNL  Plan:  MD has ordered IV Kphos x1  Will F/U with AM labs and continue to replace as needed.   Raiford Noble, PharmD, BCPS Clinical Pharmacist 11/08/2019 7:24 AM

## 2019-11-08 NOTE — Progress Notes (Signed)
[facility] SURGICAL PROGRESS NOTE (cpt [id number])  Hospital Day(s): 2.   Interval History: Patient seen and examined, no acute events or new complaints overnight. Patient's biggest complaint is pain in her right hand secondary to the IV, denies fever, chills, nausea, emesis, abdominal pain, or pain in her buttock. No new labs aside from glucose monitoring which shows improved, but persistent, hyperglycemia. She is NPO this morning as a precaution. She continues on cefepime and vancomycin for presumed sepsis. No other complaints.  Review of Systems:  Constitutional: denies fever, chills  HEENT: denies cough or congestion  Respiratory: denies any shortness of breath  Cardiovascular: denies chest pain or palpitations  Gastrointestinal: denies abdominal pain, N/V, or diarrhea/and bowel function as per interval history Genitourinary: denies burning with urination or urinary frequency Musculoskeletal: denies pain, decreased motor or sensation Integumentary: denies any other rashes or skin discolorations  Vital signs in last 24 hours: [min-max] current  Temp:  [98.1 F (36.7 C)-99.4 F (37.4 C)] 98.2 F (36.8 C) (08/06 0532) Pulse Rate:  [94-107] 98 (08/06 0532) Resp:  [16-22] 20 (08/06 0532) BP: (116-141)/(69-83) 119/69 (08/06 0532) SpO2:  [96 %-100 %] 96 % (08/06 0532)     Height: 5\' 6"  (167.6 cm) Weight: 65.8 kg BMI (Calculated): 23.41   Intake/Output last 2 shifts:  08/05 0701 - 08/06 0700 In: 1345.7 [I.V.:258.7; IV Piggyback:1087] Out: 3800 [Urine:3800]   Physical Exam:  Constitutional: alert, cooperative and no distress  HENT: normocephalic without obvious abnormality, mucus membranes are dry Eyes: PERRL, EOM's grossly intact and symmetric  Respiratory: breathing non-labored at rest  Cardiovascular: regular rate and sinus rhythm  Gastrointestinal: Soft, non-tender, and non-distended Musculoskeletal: No edema or wounds, motor and sensation grossly intact, NT   Integumentary: I&D site to the left superior gluteal crease, erythema nearly resolved, indurated, non-tender, no gross fluctuance or crepitus    Labs:  CBC Latest Ref Rng & Units 11/07/2019 11/06/2019 11/05/2019  WBC 4.0 - 10.5 K/uL 11.4(H) 14.4(H) 23.8(H)  Hemoglobin 12.0 - 15.0 g/dL 01/05/2020 15.4(H) 16.2(H)  Hematocrit 36 - 46 % 37.0 47.7(H) 51.4(H)  Platelets 150 - 400 K/uL 249 278 457(H)   CMP Latest Ref Rng & Units 11/07/2019 11/07/2019 11/07/2019  Glucose 70 - 99 mg/dL 01/07/2020) 169(C) 789(F)  BUN 6 - 20 mg/dL 15 17 18   Creatinine 0.44 - 1.00 mg/dL 810(F 7.51  Sodium 135 - 145 mmol/L 139 140 142  Potassium 3.5 - 5.1 mmol/L 3.3(L) 3.2(L) 3.1(L)  Chloride 98 - 111 mmol/L 107 107 110  CO2 22 - 32 mmol/L 22 21(L) 20(L)  Calcium 8.9 - 10.3 mg/dL 8.3(L) 8.7(L) 9.2  Total Protein 6.5 - 8.1 g/dL - - -  Total Bilirubin 0.3 - 1.2 mg/dL - - -  Alkaline Phos 38 - 126 U/L - - -  AST 15 - 41 U/L - - -  ALT 0 - 44 U/L - - -     Imaging studies: No new pertinent imaging studies   Assessment/Plan: (ICD-10's: L02.31) 44 y.o. female with improvement in erythema and pain, without fever or leukocytosis, s/p I&D in the ED on 08/02 without further evidence of abscess or necrotizing infection on examination or imaging, complicated byDKA.   - I do not think there is any residual infection or undrained abscess present that would warrant repeat imaging or intervention at this time.   - Okay for diet from surgical standpoint    - Continue IV ABx - Pain control prn - Monitor leukocytosis; fever curve -  Further management per primary team; we will sign off but will be available if needed.  All of the above findings and recommendations were discussed with the patient, and the medical team, and all of patient's questions were answered to her expressed satisfaction.  -- Lynden Oxford, PA-C [facility] 11/08/2019, 7:23 AM [phone] M-F: 7am - 4pm

## 2019-11-08 NOTE — Progress Notes (Signed)
PROGRESS NOTE    Brittany Bates  DGU:[id number] DOB: 05-28-1975 DOA: 11/05/2019 PCP: Patient, No Pcp Per    Brief Narrative:  This is a 44 yo female with a PMH of Migraines, IBS,Type II Diabetes Mellitus, and Bronchitis.  She presented to [facility] ER on 08/3 due to previous pt complaints of not feeling well over the past several days, constipation, and generalized pain according to her husband.  She also has had an abscess on her buttocks x5 days and underwent an I&D (drained scant amount of purulent drainage) in the ER on 08/2; started on a course of doxycycline; and advised to follow-up with her PCP.  During current ER presentation pt altered with agitation and short of breath.  Lab results revealed CO@ <7, glucose 528, BUN 25, creatinine 1.31, troponin 25, lactic acid 4.2, osmolality 337, wbc 23.8, beta hydoxy >8.00, and vbg pH 6.91/pCO2 19/bicarb 3.8.  Pt received cefepime, droperidol, 2L LR bolus, 2L NS, flagyl, morphine, potassium, and vancomycin.  Insulin gtt initiated due to DKA.  According to pts husband the pt has been managing her diabetes with weight management/healthy eating and has not required medication.  She was subsequently admitted to ICU for additional workup and treatment per PCCM team and started on vancomycin,cefepime nad flagyl.  Assessment & Plan:   Principal Problem:   DKA (diabetic ketoacidoses) (HCC): Pt started on insulin gtt and AG closed was transitioned to long acting insulin.  has been started on diet. SSI/A1C.   Tobacco abuse: Counseled pt on tobacco cessation.     Cellulitis, gluteal, left: Pt continued on iv abx.  Blood cx neg x 2 days.   Hypophosphatemia: Pt given kphos iv x 1 We will follow levels.    DVT prophylaxis:  Code Status:  Full Code Family Communication:  None at bedside.  Disposition Plan: Home with dressing changes.  Consultants:   General Surgery.  Procedures: N/A  Antimicrobials:  Cefepime day 3 .   Subjective: 8/5: Patient  is an ICU transfer after being treated for diabetic ketoacidosis and Left gluteal abscesses. Patient was started on IV antibiotics.  Currently vitals are stable anion gap is closed, she is tolerating a diet.  General surgery consulted and a CT abdomen and pelvis showed no obvious abscess.  And plan currently is to continue patient and monitor on IV antibiotic therapy.we will start pt on Lantus as her a1c is not at goal. Pt is stable and doing better today will d/c insulin drip and change to sub long acting insulin.  11/08/19: Pt is alert and awake , oriented, reports back pain but is otherwise doing better and at baseline.  Afebrile o/n no issues reported and d/w pt about xray of her back , lbp started last night.  Xray is wnl.CRP is elevated we will follow.  Objective: Vitals:   11/07/19 1713 11/07/19 1956 11/08/19 0532 11/08/19 1218  BP: 124/73 126/78 119/69 131/82  Pulse: (!) 107 97 98 95  Resp: 16 20 20 16   Temp: 98.1 F (36.7 C) 98.1 F (36.7 C) 98.2 F (36.8 C) 98 F (36.7 C)  TempSrc: Oral Oral Oral Oral  SpO2: 100% 100% 96% 100%  Weight:      Height:        Intake/Output Summary (Last 24 hours) at 11/08/2019 1852 Last data filed at 11/08/2019 1800 Gross per 24 hour  Intake 2222.6 ml  Output 2600 ml  Net -377.4 ml   Filed Weights   11/05/19 2123  Weight: 65.8 kg  Examination: Blood pressure 131/82, pulse 95, temperature 98 F (36.7 C), temperature source Oral, resp. rate 16, height 5\' 6"  (1.676 m), weight 65.8 kg, last menstrual period 09/02/2017, SpO2 100 %. General exam: Appears calm and comfortable  Respiratory system: Clear to auscultation. Respiratory effort normal. Cardiovascular system: S1 & S2 heard, RRR. No JVD, murmurs, rubs, gallops or clicks. No pedal edema. Gastrointestinal system: Abdomen is nondistended, soft and nontender. No organomegaly or masses felt. Normal bowel sounds heard. Central nervous system: Alert and oriented. No focal neurological  deficits. Extremities: Symmetric 5 x 5 power. Skin: No rashes, lesions or ulcers Psychiatry: Judgement and insight appear normal. Mood & affect appropriate.    Data Reviewed: I have personally reviewed following labs and imaging studies  CBC: Recent Labs  Lab 11/05/19 2208 11/06/19 0353 11/07/19 0516 11/08/19 0812  WBC 23.8* 14.4* 11.4* 9.7  NEUTROABS  --  11.6*  --  7.1  HGB 16.2* 15.4* 13.2 13.8  HCT 51.4* 47.7* 37.0 38.5  MCV 101.6* 100.2* 91.4 91.4  PLT 457* 278 249 228   Basic Metabolic Panel: Recent Labs  Lab 11/06/19 0353 11/06/19 1422 11/07/19 0233 11/07/19 0516 11/07/19 1359 11/07/19 1811 11/08/19 0812  NA 139   < > 141 142 140 139 140  K 3.0*   < > 3.2* 3.1* 3.2* 3.3* 3.8  CL 111   < > 111 110 107 107 109  CO2 11*   < > 18* 20* 21* 22 16*  GLUCOSE 382*   < > 145* 152* 168* 231* 218*  BUN 27*   < > 18 18 17 15 14   CREATININE 1.15*   < > 0.62 0.59 0.66 0.61 0.54  CALCIUM 8.8*   < > 9.0 9.2 8.7* 8.3* 8.7*  MG 1.9  --   --   --  2.0  --  2.1  PHOS  --   --   --   --   --   --  1.8*   < > = values in this interval not displayed.   GFR: Estimated Creatinine Clearance: 84.9 mL/min (by C-G formula based on SCr of 0.54 mg/dL). Liver Function Tests: Recent Labs  Lab 11/05/19 2208 11/08/19 0812  AST 14* 11*  ALT 13 10  ALKPHOS 135* 70  BILITOT 1.8* 1.2  PROT 8.5* 5.9*  ALBUMIN 4.2 2.7*   No results for input(s): LIPASE, AMYLASE in the last 168 hours. No results for input(s): AMMONIA in the last 168 hours. Coagulation Profile: Recent Labs  Lab 11/07/19 0233  INR 1.0   Cardiac Enzymes: No results for input(s): CKTOTAL, CKMB, CKMBINDEX, TROPONINI in the last 168 hours. BNP (last 3 results) No results for input(s): PROBNP in the last 8760 hours. HbA1C: Recent Labs    11/06/19 0353  HGBA1C 13.5*   CBG: Recent Labs  Lab 11/07/19 1957 11/08/19 0522 11/08/19 0756 11/08/19 1216 11/08/19 1628  GLUCAP 248* 177* 208* 150* 247*   Lipid  Profile: No results for input(s): CHOL, HDL, LDLCALC, TRIG, CHOLHDL, LDLDIRECT in the last 72 hours. Thyroid Function Tests: No results for input(s): TSH, T4TOTAL, FREET4, T3FREE, THYROIDAB in the last 72 hours. Anemia Panel: No results for input(s): VITAMINB12, FOLATE, FERRITIN, TIBC, IRON, RETICCTPCT in the last 72 hours. Sepsis Labs: Recent Labs  Lab 11/05/19 2208 11/06/19 0010 11/06/19 0353 11/07/19 0516  PROCALCITON  --   --  3.60 8.76  LATICACIDVEN 4.2* 2.7*  --   --     Recent Results (from the past 240 hour(s))  SARS Coronavirus 2 by RT PCR (hospital order, performed in [facility] hospital lab) Nasopharyngeal Nasopharyngeal Swab     Status: None   Collection Time: 11/05/19 11:09 PM   Specimen: Nasopharyngeal Swab  Result Value Ref Range Status   SARS Coronavirus 2 NEGATIVE NEGATIVE Final    Comment: (NOTE) SARS-CoV-2 target nucleic acids are NOT DETECTED.  The SARS-CoV-2 RNA is generally detectable in upper and lower respiratory specimens during the acute phase of infection. The lowest concentration of SARS-CoV-2 viral copies this assay can detect is 250 copies / mL. A negative result does not preclude SARS-CoV-2 infection and should not be used as the sole basis for treatment or other patient management decisions.  A negative result may occur with improper specimen collection / handling, submission of specimen other than nasopharyngeal swab, presence of viral mutation(s) within the areas targeted by this assay, and inadequate number of viral copies (<250 copies / mL). A negative result must be combined with clinical observations, patient history, and epidemiological information.  Fact Sheet for Patients:   BoilerBrush.com.cy  Fact Sheet for Healthcare Providers: https://pope.com/  This test is not yet approved or  cleared by the [place] FDA and has been authorized for detection and/or diagnosis of SARS-CoV-2  by FDA under an Emergency Use Authorization (EUA).  This EUA will remain in effect (meaning this test can be used) for the duration of the COVID-19 declaration under Section 564(b)(1) of the Act, 21 U.S.C. section 360bbb-3(b)(1), unless the authorization is terminated or revoked sooner.  Performed at [facility], [address] [place], [place] [address]   Blood culture (routine x 2)     Status: None (Preliminary result)   Collection Time: 11/06/19 12:10 AM   Specimen: BLOOD LEFT HAND  Result Value Ref Range Status   Specimen Description BLOOD LEFT HAND  Final   Special Requests   Final    BOTTLES DRAWN AEROBIC AND ANAEROBIC Blood Culture adequate volume   Culture   Final    NO GROWTH 2 DAYS Performed at [facility], [address], [place], [place] [address]    Report Status PENDING  Incomplete     Radiology Studies: DG Lumbar Spine 2-3 Views  Result Date: 11/08/2019 CLINICAL DATA:  Low back pain.  Recent falls. EXAM: LUMBAR SPINE - 2-3 VIEW COMPARISON:  CT abdomen and pelvis 11/06/2019 FINDINGS: There are 5 non rib-bearing lumbar type vertebrae. There is slight left convex curvature of the upper lumbar spine without listhesis. No fracture is identified. Intervertebral disc space heights are preserved. A moderate amount of stool is partially visualized in the colon. IMPRESSION: No acute osseous abnormality. Electronically Signed   By: Sebastian Ache M.D.   On: 11/08/2019 11:07    Scheduled Meds: . heparin  5,000 Units Subcutaneous Q8H  . insulin aspart  0-5 Units Subcutaneous QHS  . insulin aspart  0-9 Units Subcutaneous TID WC  . insulin glargine  10 Units Subcutaneous Q12H  . [START ON 11/09/2019] multivitamin with minerals  1 tablet Oral Daily  . Ensure Max Protein  11 oz Oral BID   Continuous Infusions: . sodium chloride 10 mL/hr at 11/08/19 1800  . ceFEPime (MAXIPIME) IV Stopped (11/08/19 1754)  . famotidine (PEPCID) IV 20 mg (11/08/19 1726)  .  vancomycin 1,000 mg (11/08/19 1843)     LOS: 2 days   Gertha Calkin, MD [facility] Pager [phone] If 7PM-7AM, please contact night-coverage [email or web address] Password [facility] 11/08/2019, 6:52 PM

## 2019-11-08 NOTE — Progress Notes (Signed)
Pharmacy Antibiotic Note  Brittany Bates is a 44 y.o. female admitted on 11/05/2019 with wound infection and DKA.  Patient S/P I &D  Of left gluteal abscess on 8/2 and discharged home on doxycycline. Pharmacy has been consulted for Cefepime and Vancomycin dosing.   Per surgery team, No evidence of undrained abscess or necrotizing infection on examination.  8/6 Patient's renal function has remained stable.   Plan: Day 3 IV antibiotic. Continue Cefepime 2gm IV q8hrs  Continue vancomycin to 1000mg  IV 12 hours based on Angie vancomycin nomogram. Monitor renal function and plan to draw vanc level prior to 4th or 5th dose on 8/7.  Height: 5\' 6"  (167.6 cm) Weight: 65.8 kg (145 lb) IBW/kg (Calculated) : 59.3  Temp (24hrs), Avg:98.4 F (36.9 C), Min:98.1 F (36.7 C), Max:99.4 F (37.4 C)  Recent Labs  Lab 11/05/19 2208 11/05/19 2208 11/06/19 0010 11/06/19 0353 11/06/19 1422 11/06/19 2221 11/07/19 0233 11/07/19 0516 11/07/19 1359 11/07/19 1811  WBC 23.8*  --   --  14.4*  --   --   --  11.4*  --   --   CREATININE 1.31*   < >  --  1.15*   < > 0.53 0.62 0.59 0.66 0.61  LATICACIDVEN 4.2*  --  2.7*  --   --   --   --   --   --   --    < > = values in this interval not displayed.    Estimated Creatinine Clearance: 84.9 mL/min (by C-G formula based on SCr of 0.61 mg/dL).    Antimicrobials this admission: Vancomycin 8/4 >> Cefepime 8/4 >>  Microbiology results: 8/4 BCx: NGTD  Thank you for allowing pharmacy to be a part of this patient's care.  10/4, PharmD Pharmacy Resident  11/08/2019 7:40 AM

## 2019-11-09 ENCOUNTER — Inpatient Hospital Stay: Payer: BLUE CROSS/BLUE SHIELD

## 2019-11-09 LAB — COMPREHENSIVE METABOLIC PANEL
ALT: 9 U/L (ref 0–44)
AST: 10 U/L — ABNORMAL LOW (ref 15–41)
Albumin: 2.6 g/dL — ABNORMAL LOW (ref 3.5–5.0)
Alkaline Phosphatase: 71 U/L (ref 38–126)
Anion gap: 11 (ref 5–15)
BUN: 12 mg/dL (ref 6–20)
CO2: 23 mmol/L (ref 22–32)
Calcium: 8.4 mg/dL — ABNORMAL LOW (ref 8.9–10.3)
Chloride: 105 mmol/L (ref 98–111)
Creatinine, Ser: 0.55 mg/dL (ref 0.44–1.00)
GFR calc Af Amer: >60 mL/min (ref >60)
GFR calc non Af Amer: >60 mL/min (ref >60)
Glucose, Bld: 218 mg/dL — ABNORMAL HIGH (ref 70–99)
Potassium: 3.2 mmol/L — ABNORMAL LOW (ref 3.5–5.1)
Sodium: 139 mmol/L (ref 135–145)
Total Bilirubin: 1.1 mg/dL (ref 0.3–1.2)
Total Protein: 5.8 g/dL — ABNORMAL LOW (ref 6.5–8.1)

## 2019-11-09 LAB — CBC
HCT: 36.6 % (ref 36.0–46.0)
Hemoglobin: 12.9 g/dL (ref 12.0–15.0)
MCH: 31.7 pg (ref 26.0–34.0)
MCHC: 35.2 g/dL (ref 30.0–36.0)
MCV: 89.9 fL (ref 80.0–100.0)
Platelets: 251 10*3/uL (ref 150–400)
RBC: 4.07 MIL/uL (ref 3.87–5.11)
RDW: 11.7 % (ref 11.5–15.5)
WBC: 9.3 10*3/uL (ref 4.0–10.5)
nRBC: 0 % (ref 0.0–0.2)

## 2019-11-09 LAB — PHOSPHORUS: Phosphorus: 2.8 mg/dL (ref 2.5–4.6)

## 2019-11-09 LAB — GLUCOSE, CAPILLARY
Glucose-Capillary: 181 mg/dL — ABNORMAL HIGH (ref 70–99)
Glucose-Capillary: 204 mg/dL — ABNORMAL HIGH (ref 70–99)
Glucose-Capillary: 134 mg/dL — ABNORMAL HIGH (ref 70–99)

## 2019-11-09 LAB — MAGNESIUM: Magnesium: 2.1 mg/dL (ref 1.7–2.4)

## 2019-11-09 MED ORDER — SITAGLIPTIN PHOSPHATE 100 MG PO TABS: 50.0000 mg | ORAL_TABLET | Freq: Every day | ORAL | 0 refills | Status: DC

## 2019-11-09 MED ORDER — CEPHALEXIN 500 MG PO CAPS: 500.0000 mg | ORAL_CAPSULE | Freq: Four times a day (QID) | ORAL | 0 refills | Status: DC

## 2019-11-09 NOTE — Plan of Care (Signed)

## 2019-11-09 NOTE — Progress Notes (Signed)
Nutrition Brief Note  RD received another consult for DM diet education. RD provided diet education to patient on 8/6. Please see RD note from 8/6 for full details. RD called patient today over the phone to see if she has any further questions regarding diet education. Patient reports she understands carbohydrate modified diet and does not have any questions at this time.   Felix Pacini, MS, RD, LDN Pager number available on Amion

## 2019-11-09 NOTE — Discharge Summary (Signed)
Physician Discharge Summary  Brittany Bates:[id number] DOB: [birth year] DOA: 11/05/2019  PCP: Patient, No Pcp Per  Admit date: 11/05/2019 Discharge date: 11/09/2019  Time spent: 20 minutes  Recommendations for Outpatient Follow-up:  1. PCP in 1 week for hospital followup.    Discharge Diagnoses:   DKA (diabetic ketoacidoses) (HCC): Pt started on insulin gtt and AG closed was transitioned to long acting insulin.  has been started on diet. SSI/A1C is 13.5 pt has been noncomplaint with meds  I have restarted her metformin and added [place].    Tobacco abuse: Counseled pt on tobacco cessation.   Cellulitis, gluteal, left: Pt continued on iv abx.  Blood cx neg x 2 days. Keflex qid x 10 days.    Hypophosphatemia: Pt given kphos iv x 1 We will follow levels. Replaced.  Discharge Condition:  Good.  Diet recommendation:  Carbohydrate consistent.  Filed Weights   11/05/19 2123  Weight: 65.8 kg    History of present illness:  This is a 44 yo female with a PMH of Migraines, IBS,Type II Diabetes Mellitus, and Bronchitis. She presented to [facility] ER on 08/3 due to previous pt complaints of not feeling well over the past several days, constipation, and generalized pain according to her husband. She also has had an abscess on her buttocks x5 days and underwent an I&D (drained scant amount of purulent drainage) in the ER on 08/2; started on a course of doxycycline; and advised to follow-up with her PCP. During current ER presentation pt altered with agitation and short of breath. Lab results revealed CO@ <7, glucose 528, BUN 25, creatinine 1.31, troponin 25, lactic acid 4.2, osmolality 337, wbc 23.8, beta hydoxy >8.00, and vbg pH 6.91/pCO2 19/bicarb 3.8. Pt received cefepime, droperidol, 2L LR bolus, 2L NS, flagyl, morphine, potassium, and vancomycin. Insulin gtt initiated due to DKA. According to pts husband the pt has been managing her diabetes with weight management/healthy eating  and has not required medication. She was subsequently admitted to ICU for additional workup and treatment per PCCM team and started on vancomycin,cefepime nad flagyl.   Hospital Course:  8/5: Patient is an ICU transfer after being treated for diabetic ketoacidosis and Left gluteal abscesses. Patient was started on IV antibiotics.  Currently vitals are stable anion gap is closed, she is tolerating a diet.  General surgery consulted and a CT abdomen and pelvis showed no obvious abscess.  And plan currently is to continue patient and monitor on IV antibiotic therapy.we will start pt on Lantus as her a1c is not at goal. Pt is stable and doing better today will d/c insulin drip and change to sub long acting insulin.  11/08/19: Pt is alert and awake , oriented, reports back pain but is otherwise doing better and at baseline.  Afebrile o/n no issues reported and d/w pt about xray of her back , lbp started last night.  Xray is wnl.CRP is elevated we will follow.  11/09/19 Pt is upright , and denies any complaints today. States she feels better. D/W pt about plan for her diabetes and cont abx for her gluteal abscess. We have added januvia to her regimen of diabetes care with metformin.  Procedures: N/A  Consultations: General surgery.  Discharge Exam: Vitals:   11/08/19 2010 11/09/19 0532  BP: 129/80 127/82  Pulse: (!) 107 100  Resp: 20 20  Temp: 98.9 F (37.2 C) 99.2 F (37.3 C)  SpO2: 100% 98%   Physical Exam Vitals reviewed.  Constitutional:  General: She is not in acute distress.    Appearance: Normal appearance. She is not toxic-appearing.  HENT:     Head: Normocephalic and atraumatic.     Right Ear: External ear normal.     Left Ear: External ear normal.     Nose: Nose normal.     Mouth/Throat:     Pharynx: Oropharynx is clear.  Eyes:     Extraocular Movements: Extraocular movements intact.  Cardiovascular:     Rate and Rhythm: Normal rate and regular rhythm.      Pulses: Normal pulses.     Heart sounds: Normal heart sounds.  Pulmonary:     Effort: Pulmonary effort is normal.     Breath sounds: Normal breath sounds.  Abdominal:     General: Abdomen is flat.  Musculoskeletal:     Cervical back: Normal range of motion.  Skin:    General: Skin is warm and dry.  Neurological:     General: No focal deficit present.     Mental Status: She is alert and oriented to person, place, and time.  Psychiatric:        Mood and Affect: Mood normal.     Discharge Instructions Discharge Instructions    Call MD for:  difficulty breathing, headache or visual disturbances   Complete by: As directed    Call MD for:  persistant dizziness or light-headedness   Complete by: As directed    Call MD for:  redness, tenderness, or signs of infection (pain, swelling, redness, odor or green/yellow discharge around incision site)   Complete by: As directed    Call MD for:  severe uncontrolled pain   Complete by: As directed    Call MD for:  temperature >100.4   Complete by: As directed    Diet - low sodium heart healthy   Complete by: As directed    Discharge instructions   Complete by: As directed    Please f/u with pcp in one week for hospital f/u appt. Please take all meds as prescribed. Note the new medication Januvia for your diabetes in addition to your metformin.   Increase activity slowly   Complete by: As directed    No wound care   Complete by: As directed      Allergies as of 11/09/2019      Reactions   Lactose Intolerance (gi)    Latex Hives   Morphine And Related Itching      Medication List    STOP taking these medications   doxycycline 100 MG tablet Commonly known as: VIBRA-TABS     TAKE these medications   cephALEXin 500 MG capsule Commonly known as: KEFLEX Take 1 capsule (500 mg total) by mouth 4 (four) times daily for 10 days.   cetirizine 10 MG tablet Commonly known as: ZYRTEC Take 1 tablet (10 mg total) by mouth daily.    fluticasone 50 MCG/ACT nasal spray Commonly known as: Flonase Place 1 spray into both nostrils 2 (two) times daily.   metFORMIN 500 MG tablet Commonly known as: GLUCOPHAGE Take 1 tablet (500 mg total) 2 (two) times daily with a meal by mouth.   sitaGLIPtin 100 MG tablet Commonly known as: Januvia Take 0.5 tablets (50 mg total) by mouth daily.      Allergies  Allergen Reactions  . Lactose Intolerance (Gi)   . Latex Hives  . Morphine And Related Itching    The results of significant diagnostics from this hospitalization (including imaging, microbiology, ancillary and  laboratory) are listed below for reference.    Significant Diagnostic Studies: DG Lumbar Spine 2-3 Views  Result Date: 11/08/2019 CLINICAL DATA:  Low back pain.  Recent falls. EXAM: LUMBAR SPINE - 2-3 VIEW COMPARISON:  CT abdomen and pelvis 11/06/2019 FINDINGS: There are 5 non rib-bearing lumbar type vertebrae. There is slight left convex curvature of the upper lumbar spine without listhesis. No fracture is identified. Intervertebral disc space heights are preserved. A moderate amount of stool is partially visualized in the colon. IMPRESSION: No acute osseous abnormality. Electronically Signed   By: Sebastian Ache M.D.   On: 11/08/2019 11:07   MR LUMBAR SPINE WO CONTRAST  Result Date: 11/09/2019 CLINICAL DATA:  Low back pain, infection suspected EXAM: MRI LUMBAR SPINE WITHOUT CONTRAST TECHNIQUE: Multiplanar, multisequence MR imaging of the lumbar spine was performed. No intravenous contrast was administered. COMPARISON:  None. FINDINGS: Segmentation:  Standard Alignment:  Normal Vertebrae:  Normal Conus medullaris and cauda equina: Conus extends to the L1-2 level. Conus and cauda equina appear normal. Paraspinal and other soft tissues: Negative Disc levels: No disc herniation, spinal canal stenosis or nerve root impingement IMPRESSION: Normal lumbar spine MRI. Electronically Signed   By: Deatra Robinson M.D.   On: 11/09/2019  01:15   CT ABDOMEN PELVIS W CONTRAST  Result Date: 11/06/2019 CLINICAL DATA:  Code sepsis EXAM: CT ABDOMEN AND PELVIS WITH CONTRAST TECHNIQUE: Multidetector CT imaging of the abdomen and pelvis was performed using the standard protocol following bolus administration of intravenous contrast. CONTRAST:  57mL OMNIPAQUE IOHEXOL 300 MG/ML  SOLN COMPARISON:  None. FINDINGS: Lower chest: Lung bases demonstrate hazy dependent atelectasis. 3 mm right lung base nodule, series 4, image number 11. No consolidation or pleural effusion. Motion degradation. Hepatobiliary: No focal liver abnormality is seen. No gallstones, gallbladder wall thickening, or biliary dilatation. Pancreas: Unremarkable. No pancreatic ductal dilatation or surrounding inflammatory changes. Spleen: Normal in size without focal abnormality. Adrenals/Urinary Tract: Right adrenal gland is normal. 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 Nonvisualized left kidney. Compensatory hypertrophy of the right kidney. Multiple punctate intrarenal stones on the right. The bladder is unremarkable. Stomach/Bowel: Stomach is within normal limits. Appendix appears normal. No evidence of bowel wall thickening, distention, or inflammatory changes. Vascular/Lymphatic: Nonaneurysmal aorta with mild aortic atherosclerosis. No suspicious nodes Reproductive: Multiple hypoenhancing masses within the uterus, likely fibroids. No suspicious adnexal mass Other: Negative for free air or free fluid. Musculoskeletal: No acute or significant osseous findings. IMPRESSION: 1. No CT evidence for acute intra-abdominal or pelvic abnormality. 2. 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 Compensatory hypertrophy of the right kidney. Multiple punctate intrarenal stones on the right. 3. 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 4. Fibroid uterus. 5. 3 mm right lung base nodule. No follow-up needed if patient is low-risk. Non-contrast chest  CT can be considered in 12 months if patient is high-risk. This recommendation follows the consensus statement: Guidelines for Management of Incidental Pulmonary Nodules Detected on CT Images: From the Fleischner Society 2017; Radiology 2017; 284:228-243. 6. Aortic atherosclerosis. Aortic Atherosclerosis (ICD10-I70.0). Electronically Signed   By: Jasmine Pang M.D.   On: 11/06/2019 01:52   DG Chest Port 1 View  Result Date: 11/05/2019 CLINICAL DATA:  Abscess to the buttocks for 4 days. Shortness of breath and hyperventilating. EXAM: PORTABLE CHEST 1 VIEW COMPARISON:  11/30/2018 FINDINGS: Shallow inspiration. Heart size and pulmonary vascularity are normal for technique. Lungs are clear. No pleural effusions. No pneumothorax. Mediastinal contours appear intact.  IMPRESSION: No active disease. Electronically Signed   By: Burman Nieves M.D.   On: 11/05/2019 22:57    Microbiology: Recent Results (from the past 240 hour(s))  SARS Coronavirus 2 by RT PCR (hospital order, performed in [facility] hospital lab) Nasopharyngeal Nasopharyngeal Swab     Status: None   Collection Time: 11/05/19 11:09 PM   Specimen: Nasopharyngeal Swab  Result Value Ref Range Status   SARS Coronavirus 2 NEGATIVE NEGATIVE Final    Comment: (NOTE) SARS-CoV-2 target nucleic acids are NOT DETECTED.  The SARS-CoV-2 RNA is generally detectable in upper and lower respiratory specimens during the acute phase of infection. The lowest concentration of SARS-CoV-2 viral copies this assay can detect is 250 copies / mL. A negative result does not preclude SARS-CoV-2 infection and should not be used as the sole basis for treatment or other patient management decisions.  A negative result may occur with improper specimen collection / handling, submission of specimen other than nasopharyngeal swab, presence of viral mutation(s) within the areas targeted by this assay, and inadequate number of viral copies (<250 copies / mL). A negative  result must be combined with clinical observations, patient history, and epidemiological information.  Fact Sheet for Patients:   BoilerBrush.com.cy  Fact Sheet for Healthcare Providers: https://pope.com/  This test is not yet approved or  cleared by the [place] FDA and has been authorized for detection and/or diagnosis of SARS-CoV-2 by FDA under an Emergency Use Authorization (EUA).  This EUA will remain in effect (meaning this test can be used) for the duration of the COVID-19 declaration under Section 564(b)(1) of the Act, 21 U.S.C. section 360bbb-3(b)(1), unless the authorization is terminated or revoked sooner.  Performed at [facility], [address] [place], [place] [address]   Blood culture (routine x 2)     Status: None (Preliminary result)   Collection Time: 11/06/19 12:10 AM   Specimen: BLOOD LEFT HAND  Result Value Ref Range Status   Specimen Description BLOOD LEFT HAND  Final   Special Requests   Final    BOTTLES DRAWN AEROBIC AND ANAEROBIC Blood Culture adequate volume   Culture   Final    NO GROWTH 3 DAYS Performed at [facility], [address] [place], [place] [address]    Report Status PENDING  Incomplete     Labs: Basic Metabolic Panel: Recent Labs  Lab 11/06/19 0353 11/06/19 1422 11/07/19 0516 11/07/19 1359 11/07/19 1811 11/08/19 0812 11/09/19 0644  NA 139   < > 142 140 139 140 139  K 3.0*   < > 3.1* 3.2* 3.3* 3.8 3.2*  CL 111   < > 110 107 107 109 105  CO2 11*   < > 20* 21* 22 16* 23  GLUCOSE 382*   < > 152* 168* 231* 218* 218*  BUN 27*   < > CREATININE 1.15*   < > 0.59 0.66 0.61 0.54 0.55  CALCIUM 8.8*   < > 9.2 8.7* 8.3* 8.7* 8.4*  MG 1.9  --   --  2.0  --  2.1 2.1  PHOS  --   --   --   --   --  1.8* 2.8   < > = values in this interval not displayed.   Liver Function Tests: Recent Labs  Lab 11/05/19 2208 11/08/19 0812 11/09/19 0644  AST  14* 11* 10*  ALT ALKPHOS 135* 70 71  BILITOT 1.8*  1.2 1.1  PROT 8.5* 5.9* 5.8*  ALBUMIN 4.2 2.7* 2.6*   No results for input(s): LIPASE, AMYLASE in the last 168 hours. No results for input(s): AMMONIA in the last 168 hours. CBC: Recent Labs  Lab 11/05/19 2208 11/06/19 0353 11/07/19 0516 11/08/19 0812 11/09/19 0644  WBC 23.8* 14.4* 11.4* 9.7 9.3  NEUTROABS  --  11.6*  --  7.1  --   HGB 16.2* 15.4* 13.2 13.8 12.9  HCT 51.4* 47.7* 37.0 38.5 36.6  MCV 101.6* 100.2* 91.4 91.4 89.9  PLT 457* 278 249 228 251   CBG: Recent Labs  Lab 11/08/19 1216 11/08/19 1628 11/08/19 2116 11/09/19 0611 11/09/19 0744  GLUCAP 150* 247* 313* 181* 204*    Signed:  Gertha CalkinEkta V Siona Coulston MD.  [facility] 11/09/2019, 11:13 AM

## 2019-11-09 NOTE — Progress Notes (Signed)
Nurse had patient do return demonstration on insulin administration prior to discharge. Patient properly administered insulin to her belly. IV's removed and education given prior to discharge. Patients arm was found to be infiltrated this morning upon shift start, patient stated that the arm had been hurting since yesterday, IV to right arm removed and warm compress and elevation to extremity provided per MD ok. Arm continues to have some swelling and pain upon palpation patient educated to continue warm compresses and elevation of extremity. Patient transported home by spouse.

## 2019-11-09 NOTE — Progress Notes (Signed)
Inpatient Diabetes Program Recommendations  AACE/ADA: New Consensus Statement on Inpatient Glycemic Control (2015)  Target Ranges:  Prepandial:   less than 140 mg/dL      Peak postprandial:   less than 180 mg/dL (1-2 hours)      Critically ill patients:  140 - 180 mg/dL   Results for Brittany Bates, Brittany Bates (MRN [id number]) as of 11/09/2019 09:53  Ref. Range 11/08/2019 07:56 11/08/2019 12:16 11/08/2019 16:28 11/08/2019 21:16 11/09/2019 06:11 11/09/2019 07:44  Glucose-Capillary Latest Ref Range: 70 - 99 mg/dL 244 (H) 010 (H) 272 (H) 313 (H) 181 (H) 204 (H)   Review of Glycemic Control  Diabetes history: DM2 Outpatient Diabetes medications: None Current orders for Inpatient glycemic control: Lantus 10 units Q12H, Novolog 0-9 units TID with meals, Novolog 0-5 units QHS  Inpatient Diabetes Program Recommendations:    Insulin-Basal: Per chart, patient did not receive evening dose of Lantus yesterday. Noted Lantus already given this morning.  Insulin-Meal Coverage: Please consider ordering Novolog 5 units TID with meals for meal coverage if patient eats at least 50% of meals.  Thanks, Orlando Penner, RN, MSN, CDE Diabetes Coordinator Inpatient Diabetes Program [phone] (Team Pager from 8am to 5pm)

## 2019-11-11 LAB — CULTURE, BLOOD (ROUTINE X 2)
Culture: NO GROWTH
Special Requests: ADEQUATE

## 2019-11-15 ENCOUNTER — Emergency Department: Payer: BLUE CROSS/BLUE SHIELD

## 2019-11-15 ENCOUNTER — Other Ambulatory Visit: Payer: Self-pay

## 2019-11-15 ENCOUNTER — Encounter: Payer: Self-pay | Admitting: Emergency Medicine

## 2019-11-15 ENCOUNTER — Inpatient Hospital Stay: Payer: BLUE CROSS/BLUE SHIELD

## 2019-11-15 ENCOUNTER — Inpatient Hospital Stay
Admission: EM | Admit: 2019-11-15 | Discharge: 2019-11-20 | DRG: 871 | Disposition: A | Discharge status: Home / Self Care | Payer: BLUE CROSS/BLUE SHIELD | Attending: Hospitalist | Admitting: Hospitalist

## 2019-11-15 DIAGNOSIS — E876 Hypokalemia: Secondary | ICD-10-CM | POA: Diagnosis present

## 2019-11-15 DIAGNOSIS — D696 Thrombocytopenia, unspecified: Secondary | ICD-10-CM | POA: Diagnosis present

## 2019-11-15 DIAGNOSIS — R6520 Severe sepsis without septic shock: Secondary | ICD-10-CM | POA: Diagnosis present

## 2019-11-15 DIAGNOSIS — Z79899 Other long term (current) drug therapy: Secondary | ICD-10-CM

## 2019-11-15 DIAGNOSIS — G92 Toxic encephalopathy: Secondary | ICD-10-CM | POA: Diagnosis present

## 2019-11-15 DIAGNOSIS — J449 Chronic obstructive pulmonary disease, unspecified: Secondary | ICD-10-CM | POA: Diagnosis present

## 2019-11-15 DIAGNOSIS — E869 Volume depletion, unspecified: Secondary | ICD-10-CM | POA: Diagnosis present

## 2019-11-15 DIAGNOSIS — E739 Lactose intolerance, unspecified: Secondary | ICD-10-CM | POA: Diagnosis present

## 2019-11-15 DIAGNOSIS — E1110 Type 2 diabetes mellitus with ketoacidosis without coma: Secondary | ICD-10-CM | POA: Diagnosis present

## 2019-11-15 DIAGNOSIS — A419 Sepsis, unspecified organism: Principal | ICD-10-CM | POA: Diagnosis present

## 2019-11-15 DIAGNOSIS — Z20822 Contact with and (suspected) exposure to covid-19: Secondary | ICD-10-CM | POA: Diagnosis present

## 2019-11-15 DIAGNOSIS — Z7984 Long term (current) use of oral hypoglycemic drugs: Secondary | ICD-10-CM | POA: Diagnosis not present

## 2019-11-15 DIAGNOSIS — E872 Acidosis, unspecified: Secondary | ICD-10-CM

## 2019-11-15 DIAGNOSIS — R410 Disorientation, unspecified: Secondary | ICD-10-CM

## 2019-11-15 DIAGNOSIS — F17210 Nicotine dependence, cigarettes, uncomplicated: Secondary | ICD-10-CM | POA: Diagnosis present

## 2019-11-15 DIAGNOSIS — D729 Disorder of white blood cells, unspecified: Secondary | ICD-10-CM

## 2019-11-15 DIAGNOSIS — L03317 Cellulitis of buttock: Secondary | ICD-10-CM | POA: Diagnosis present

## 2019-11-15 DIAGNOSIS — J9601 Acute respiratory failure with hypoxia: Secondary | ICD-10-CM | POA: Diagnosis present

## 2019-11-15 DIAGNOSIS — R000 Tachycardia, unspecified: Secondary | ICD-10-CM

## 2019-11-15 DIAGNOSIS — N179 Acute kidney failure, unspecified: Secondary | ICD-10-CM | POA: Diagnosis present

## 2019-11-15 DIAGNOSIS — L0231 Cutaneous abscess of buttock: Secondary | ICD-10-CM | POA: Diagnosis present

## 2019-11-15 DIAGNOSIS — I248 Other forms of acute ischemic heart disease: Secondary | ICD-10-CM | POA: Diagnosis present

## 2019-11-15 DIAGNOSIS — N170 Acute kidney failure with tubular necrosis: Secondary | ICD-10-CM | POA: Diagnosis present

## 2019-11-15 DIAGNOSIS — E860 Dehydration: Secondary | ICD-10-CM

## 2019-11-15 LAB — CBC WITH DIFFERENTIAL/PLATELET
Abs Immature Granulocytes: 2.85 10*3/uL — ABNORMAL HIGH (ref 0.00–0.07)
Basophils Absolute: 0.1 10*3/uL (ref 0.0–0.1)
Basophils Relative: 0 %
Eosinophils Absolute: 0 10*3/uL (ref 0.0–0.5)
Eosinophils Relative: 0 %
HCT: 47.5 % — ABNORMAL HIGH (ref 36.0–46.0)
Hemoglobin: 15.3 g/dL — ABNORMAL HIGH (ref 12.0–15.0)
Immature Granulocytes: 6 %
Lymphocytes Relative: 4 %
Lymphs Abs: 2.1 10*3/uL (ref 0.7–4.0)
MCH: 32.9 pg (ref 26.0–34.0)
MCHC: 32.2 g/dL (ref 30.0–36.0)
MCV: 102.2 fL — ABNORMAL HIGH (ref 80.0–100.0)
Monocytes Absolute: 3.3 10*3/uL — ABNORMAL HIGH (ref 0.1–1.0)
Monocytes Relative: 7 %
Neutro Abs: 39.9 10*3/uL — ABNORMAL HIGH (ref 1.7–7.7)
Neutrophils Relative %: 83 %
Platelets: 784 10*3/uL — ABNORMAL HIGH (ref 150–400)
RBC: 4.65 MIL/uL (ref 3.87–5.11)
RDW: 12.7 % (ref 11.5–15.5)
Smear Review: INCREASED
WBC: 48.2 10*3/uL — ABNORMAL HIGH (ref 4.0–10.5)
nRBC: 0 % (ref 0.0–0.2)

## 2019-11-15 LAB — BASIC METABOLIC PANEL
Anion gap: 12 (ref 5–15)
Anion gap: 10 (ref 5–15)
Anion gap: 7 (ref 5–15)
Anion gap: 7 (ref 5–15)
BUN: 16 mg/dL (ref 6–20)
BUN: 14 mg/dL (ref 6–20)
BUN: 13 mg/dL (ref 6–20)
BUN: 13 mg/dL (ref 6–20)
CO2: 14 mmol/L — ABNORMAL LOW (ref 22–32)
CO2: 18 mmol/L — ABNORMAL LOW (ref 22–32)
CO2: 20 mmol/L — ABNORMAL LOW (ref 22–32)
CO2: 20 mmol/L — ABNORMAL LOW (ref 22–32)
Calcium: 9.7 mg/dL (ref 8.9–10.3)
Calcium: 9.8 mg/dL (ref 8.9–10.3)
Calcium: 9.7 mg/dL (ref 8.9–10.3)
Calcium: 9.8 mg/dL (ref 8.9–10.3)
Chloride: 113 mmol/L — ABNORMAL HIGH (ref 98–111)
Chloride: 112 mmol/L — ABNORMAL HIGH (ref 98–111)
Chloride: 114 mmol/L — ABNORMAL HIGH (ref 98–111)
Chloride: 114 mmol/L — ABNORMAL HIGH (ref 98–111)
Creatinine, Ser: 0.76 mg/dL (ref 0.44–1.00)
Creatinine, Ser: 0.53 mg/dL (ref 0.44–1.00)
Creatinine, Ser: 0.45 mg/dL (ref 0.44–1.00)
Creatinine, Ser: 0.49 mg/dL (ref 0.44–1.00)
GFR calc Af Amer: >60 mL/min (ref >60)
GFR calc Af Amer: >60 mL/min (ref >60)
GFR calc Af Amer: >60 mL/min (ref >60)
GFR calc Af Amer: >60 mL/min (ref >60)
GFR calc non Af Amer: >60 mL/min (ref >60)
GFR calc non Af Amer: >60 mL/min (ref >60)
GFR calc non Af Amer: >60 mL/min (ref >60)
GFR calc non Af Amer: >60 mL/min (ref >60)
Glucose, Bld: 197 mg/dL — ABNORMAL HIGH (ref 70–99)
Glucose, Bld: 210 mg/dL — ABNORMAL HIGH (ref 70–99)
Glucose, Bld: 245 mg/dL — ABNORMAL HIGH (ref 70–99)
Glucose, Bld: 246 mg/dL — ABNORMAL HIGH (ref 70–99)
Potassium: 3.1 mmol/L — ABNORMAL LOW (ref 3.5–5.1)
Potassium: 2.9 mmol/L — ABNORMAL LOW (ref 3.5–5.1)
Potassium: 2.8 mmol/L — ABNORMAL LOW (ref 3.5–5.1)
Potassium: 2.8 mmol/L — ABNORMAL LOW (ref 3.5–5.1)
Sodium: 139 mmol/L (ref 135–145)
Sodium: 140 mmol/L (ref 135–145)
Sodium: 141 mmol/L (ref 135–145)
Sodium: 141 mmol/L (ref 135–145)

## 2019-11-15 LAB — COMPREHENSIVE METABOLIC PANEL
ALT: 13 U/L (ref 0–44)
AST: 13 U/L — ABNORMAL LOW (ref 15–41)
Albumin: 3.7 g/dL (ref 3.5–5.0)
Alkaline Phosphatase: 173 U/L — ABNORMAL HIGH (ref 38–126)
BUN: 14 mg/dL (ref 6–20)
CO2: <7 mmol/L — ABNORMAL LOW (ref 22–32)
Calcium: 10.8 mg/dL — ABNORMAL HIGH (ref 8.9–10.3)
Chloride: 104 mmol/L (ref 98–111)
Creatinine, Ser: 1.17 mg/dL — ABNORMAL HIGH (ref 0.44–1.00)
GFR calc Af Amer: >60 mL/min (ref >60)
GFR calc non Af Amer: 57 mL/min — ABNORMAL LOW (ref >60)
Glucose, Bld: 341 mg/dL — ABNORMAL HIGH (ref 70–99)
Potassium: 4.3 mmol/L (ref 3.5–5.1)
Sodium: 135 mmol/L (ref 135–145)
Total Bilirubin: 1.8 mg/dL — ABNORMAL HIGH (ref 0.3–1.2)
Total Protein: 9.1 g/dL — ABNORMAL HIGH (ref 6.5–8.1)

## 2019-11-15 LAB — BLOOD GAS, VENOUS
Acid-base deficit: 24.9 mmol/L — ABNORMAL HIGH (ref 0.0–2.0)
Acid-base deficit: 8.4 mmol/L — ABNORMAL HIGH (ref 0.0–2.0)
Acid-base deficit: 7 mmol/L — ABNORMAL HIGH (ref 0.0–2.0)
Bicarbonate: 4.9 mmol/L — ABNORMAL LOW (ref 20.0–28.0)
Bicarbonate: 17.4 mmol/L — ABNORMAL LOW (ref 20.0–28.0)
Bicarbonate: 18.6 mmol/L — ABNORMAL LOW (ref 20.0–28.0)
O2 Saturation: 32.3 %
O2 Saturation: 74 %
O2 Saturation: 81 %
Patient temperature: 37
Patient temperature: 37
Patient temperature: 37
pCO2, Ven: 20 mmHg — ABNORMAL LOW (ref 44.0–60.0)
pCO2, Ven: 37 mmHg — ABNORMAL LOW (ref 44.0–60.0)
pCO2, Ven: 37 mmHg — ABNORMAL LOW (ref 44.0–60.0)
pH, Ven: 7 — CL (ref 7.250–7.430)
pH, Ven: 7.28 (ref 7.250–7.430)
pH, Ven: 7.31 (ref 7.250–7.430)
pO2, Ven: 33 mmHg (ref 32.0–45.0)
pO2, Ven: 45 mmHg (ref 32.0–45.0)
pO2, Ven: 50 mmHg — ABNORMAL HIGH (ref 32.0–45.0)

## 2019-11-15 LAB — GLUCOSE, CAPILLARY
Glucose-Capillary: 280 mg/dL — ABNORMAL HIGH (ref 70–99)
Glucose-Capillary: 326 mg/dL — ABNORMAL HIGH (ref 70–99)
Glucose-Capillary: 292 mg/dL — ABNORMAL HIGH (ref 70–99)
Glucose-Capillary: 256 mg/dL — ABNORMAL HIGH (ref 70–99)
Glucose-Capillary: 222 mg/dL — ABNORMAL HIGH (ref 70–99)
Glucose-Capillary: 192 mg/dL — ABNORMAL HIGH (ref 70–99)
Glucose-Capillary: 195 mg/dL — ABNORMAL HIGH (ref 70–99)
Glucose-Capillary: 176 mg/dL — ABNORMAL HIGH (ref 70–99)
Glucose-Capillary: 213 mg/dL — ABNORMAL HIGH (ref 70–99)
Glucose-Capillary: 220 mg/dL — ABNORMAL HIGH (ref 70–99)
Glucose-Capillary: 214 mg/dL — ABNORMAL HIGH (ref 70–99)
Glucose-Capillary: 192 mg/dL — ABNORMAL HIGH (ref 70–99)

## 2019-11-15 LAB — CBC
HCT: 35.5 % — ABNORMAL LOW (ref 36.0–46.0)
Hemoglobin: 12.2 g/dL (ref 12.0–15.0)
MCH: 32.8 pg (ref 26.0–34.0)
MCHC: 34.4 g/dL (ref 30.0–36.0)
MCV: 95.4 fL (ref 80.0–100.0)
Platelets: 513 10*3/uL — ABNORMAL HIGH (ref 150–400)
RBC: 3.72 MIL/uL — ABNORMAL LOW (ref 3.87–5.11)
RDW: 12.4 % (ref 11.5–15.5)
WBC: 45.1 10*3/uL — ABNORMAL HIGH (ref 4.0–10.5)
nRBC: 0 % (ref 0.0–0.2)

## 2019-11-15 LAB — URINALYSIS, ROUTINE W REFLEX MICROSCOPIC
Bacteria, UA: NONE SEEN
Bilirubin Urine: NEGATIVE
Glucose, UA: >=500 mg/dL — AB
Ketones, ur: 20 mg/dL — AB
Leukocytes,Ua: NEGATIVE
Nitrite: NEGATIVE
Protein, ur: 30 mg/dL — AB
Specific Gravity, Urine: 1.023 (ref 1.005–1.030)
pH: 5 (ref 5.0–8.0)

## 2019-11-15 LAB — TROPONIN I (HIGH SENSITIVITY)
Troponin I (High Sensitivity): 39 ng/L — ABNORMAL HIGH (ref <18)
Troponin I (High Sensitivity): 110 ng/L (ref <18)

## 2019-11-15 LAB — HEMOGLOBIN A1C
Hgb A1c MFr Bld: 12.8 % — ABNORMAL HIGH (ref 4.8–5.6)
Mean Plasma Glucose: 320.66 mg/dL

## 2019-11-15 LAB — RESPIRATORY PANEL BY RT PCR (FLU A&B, COVID)
Influenza A by PCR: NEGATIVE
Influenza B by PCR: NEGATIVE
SARS Coronavirus 2 by RT PCR: NEGATIVE

## 2019-11-15 LAB — BETA-HYDROXYBUTYRIC ACID
Beta-Hydroxybutyric Acid: >8 mmol/L — ABNORMAL HIGH (ref 0.05–0.27)
Beta-Hydroxybutyric Acid: 1.2 mmol/L — ABNORMAL HIGH (ref 0.05–0.27)
Beta-Hydroxybutyric Acid: 0.11 mmol/L (ref 0.05–0.27)

## 2019-11-15 LAB — LACTIC ACID, PLASMA
Lactic Acid, Venous: 3 mmol/L (ref 0.5–1.9)
Lactic Acid, Venous: 1.8 mmol/L (ref 0.5–1.9)

## 2019-11-15 LAB — TRIGLYCERIDES: Triglycerides: 73 mg/dL (ref <150)

## 2019-11-15 LAB — PHOSPHORUS
Phosphorus: <1 mg/dL — CL (ref 2.5–4.6)
Phosphorus: 1.1 mg/dL — ABNORMAL LOW (ref 2.5–4.6)

## 2019-11-15 LAB — MAGNESIUM
Magnesium: 2.2 mg/dL (ref 1.7–2.4)
Magnesium: 1.7 mg/dL (ref 1.7–2.4)
Magnesium: 1.6 mg/dL — ABNORMAL LOW (ref 1.7–2.4)

## 2019-11-15 LAB — PREGNANCY, URINE: Preg Test, Ur: NEGATIVE

## 2019-11-15 MED ORDER — POTASSIUM CHLORIDE 10 MEQ/100ML IV SOLN
10.0000 meq | INTRAVENOUS | Status: AC
  Administered 2019-11-15: 10 meq via INTRAVENOUS
  Filled 2019-11-15: qty 100

## 2019-11-15 MED ORDER — SODIUM CHLORIDE 0.9% FLUSH: 10.0000 mL | INTRAVENOUS | Status: DC | PRN

## 2019-11-15 MED ORDER — LACTATED RINGERS IV BOLUS
1000.0000 mL | Freq: Once | INTRAVENOUS | Status: AC
  Administered 2019-11-15: 1000 mL via INTRAVENOUS

## 2019-11-15 MED ORDER — ORAL CARE MOUTH RINSE
15.0000 mL | OROMUCOSAL | Status: DC
  Administered 2019-11-16 (×5): 15 mL via OROMUCOSAL

## 2019-11-15 MED ORDER — DEXTROSE 50 % IV SOLN: 0.0000 mL | INTRAVENOUS | Status: DC | PRN

## 2019-11-15 MED ORDER — DEXTROSE IN LACTATED RINGERS 5 % IV SOLN: INTRAVENOUS | Status: DC

## 2019-11-15 MED ORDER — POTASSIUM CHLORIDE 10 MEQ/100ML IV SOLN
10.0000 meq | INTRAVENOUS | Status: AC
  Administered 2019-11-15 – 2019-11-16 (×4): 10 meq via INTRAVENOUS
  Filled 2019-11-15 (×4): qty 100

## 2019-11-15 MED ORDER — LORAZEPAM 2 MG/ML IJ SOLN
1.0000 mg | Freq: Once | INTRAMUSCULAR | Status: AC
  Administered 2019-11-15: 1 mg via INTRAVENOUS
  Filled 2019-11-15: qty 1

## 2019-11-15 MED ORDER — DOCUSATE SODIUM 100 MG PO CAPS
100.0000 mg | ORAL_CAPSULE | Freq: Two times a day (BID) | ORAL | Status: DC | PRN
  Administered 2019-11-18 (×2): 100 mg via ORAL
  Filled 2019-11-15 (×2): qty 1

## 2019-11-15 MED ORDER — SODIUM CHLORIDE 0.9 % IV SOLN: Freq: Once | INTRAVENOUS | Status: AC

## 2019-11-15 MED ORDER — PIPERACILLIN-TAZOBACTAM 3.375 G IVPB 30 MIN
3.3750 g | Freq: Once | INTRAVENOUS | Status: AC
  Administered 2019-11-15: 3.375 g via INTRAVENOUS
  Filled 2019-11-15: qty 50

## 2019-11-15 MED ORDER — PROPOFOL 1000 MG/100ML IV EMUL
INTRAVENOUS | Status: AC
  Filled 2019-11-15: qty 100

## 2019-11-15 MED ORDER — ALBUTEROL SULFATE (2.5 MG/3ML) 0.083% IN NEBU: 2.5000 mg | INHALATION_SOLUTION | RESPIRATORY_TRACT | Status: DC | PRN

## 2019-11-15 MED ORDER — VANCOMYCIN HCL IN DEXTROSE 1-5 GM/200ML-% IV SOLN
1000.0000 mg | Freq: Once | INTRAVENOUS | Status: AC
  Administered 2019-11-15: 1000 mg via INTRAVENOUS
  Filled 2019-11-15: qty 200

## 2019-11-15 MED ORDER — HEPARIN SODIUM (PORCINE) 5000 UNIT/ML IJ SOLN
5000.0000 [IU] | Freq: Three times a day (TID) | INTRAMUSCULAR | Status: DC
  Administered 2019-11-15 – 2019-11-20 (×14): 5000 [IU] via SUBCUTANEOUS
  Filled 2019-11-15 (×15): qty 1

## 2019-11-15 MED ORDER — FAMOTIDINE IN NACL 20-0.9 MG/50ML-% IV SOLN
20.0000 mg | INTRAVENOUS | Status: DC
  Administered 2019-11-15 – 2019-11-16 (×2): 20 mg via INTRAVENOUS
  Filled 2019-11-15 (×3): qty 50

## 2019-11-15 MED ORDER — ACETAMINOPHEN 650 MG RE SUPP
650.0000 mg | Freq: Once | RECTAL | Status: AC
  Administered 2019-11-15: 650 mg via RECTAL

## 2019-11-15 MED ORDER — LACTATED RINGERS IV SOLN: INTRAVENOUS | Status: DC

## 2019-11-15 MED ORDER — CHLORHEXIDINE GLUCONATE 0.12% ORAL RINSE (MEDLINE KIT)
15.0000 mL | Freq: Two times a day (BID) | OROMUCOSAL | Status: DC
  Administered 2019-11-15 – 2019-11-20 (×8): 15 mL via OROMUCOSAL

## 2019-11-15 MED ORDER — IPRATROPIUM-ALBUTEROL 0.5-2.5 (3) MG/3ML IN SOLN
3.0000 mL | Freq: Once | RESPIRATORY_TRACT | Status: AC
  Administered 2019-11-15: 3 mL via RESPIRATORY_TRACT

## 2019-11-15 MED ORDER — CHLORHEXIDINE GLUCONATE CLOTH 2 % EX PADS: 6.0000 | MEDICATED_PAD | Freq: Every day | CUTANEOUS | Status: DC

## 2019-11-15 MED ORDER — POTASSIUM CHLORIDE 20 MEQ PO PACK
40.0000 meq | PACK | Freq: Once | ORAL | Status: DC
  Filled 2019-11-15: qty 2

## 2019-11-15 MED ORDER — FENTANYL 2500MCG IN NS 250ML (10MCG/ML) PREMIX INFUSION
0.0000 ug/h | INTRAVENOUS | Status: DC
  Administered 2019-11-15: 100 ug/h via INTRAVENOUS
  Administered 2019-11-16: 150 ug/h via INTRAVENOUS
  Filled 2019-11-15 (×2): qty 250

## 2019-11-15 MED ORDER — PROPOFOL 1000 MG/100ML IV EMUL
5.0000 ug/kg/min | INTRAVENOUS | Status: DC
  Administered 2019-11-15: 5 ug/kg/min via INTRAVENOUS
  Administered 2019-11-16: 25 ug/kg/min via INTRAVENOUS
  Filled 2019-11-15: qty 100

## 2019-11-15 MED ORDER — MAGNESIUM SULFATE 2 GM/50ML IV SOLN
2.0000 g | Freq: Once | INTRAVENOUS | Status: AC
  Administered 2019-11-15: 2 g via INTRAVENOUS
  Filled 2019-11-15: qty 50

## 2019-11-15 MED ORDER — POTASSIUM CHLORIDE 10 MEQ/100ML IV SOLN
10.0000 meq | INTRAVENOUS | Status: DC
  Filled 2019-11-15 (×2): qty 100

## 2019-11-15 MED ORDER — SODIUM BICARBONATE 8.4 % IV SOLN
50.0000 meq | Freq: Once | INTRAVENOUS | Status: AC
  Administered 2019-11-15: 50 meq via INTRAVENOUS
  Filled 2019-11-15: qty 50

## 2019-11-15 MED ORDER — CHLORHEXIDINE GLUCONATE CLOTH 2 % EX PADS
6.0000 | MEDICATED_PAD | Freq: Every day | CUTANEOUS | Status: DC
  Administered 2019-11-15 – 2019-11-20 (×5): 6 via TOPICAL

## 2019-11-15 MED ORDER — LACTATED RINGERS IV BOLUS: 20.0000 mL/kg | Freq: Once | INTRAVENOUS | Status: DC

## 2019-11-15 MED ORDER — IOHEXOL 300 MG/ML  SOLN
100.0000 mL | Freq: Once | INTRAMUSCULAR | Status: AC | PRN
  Administered 2019-11-15: 100 mL via INTRAVENOUS

## 2019-11-15 MED ORDER — INSULIN DETEMIR 100 UNIT/ML ~~LOC~~ SOLN
5.0000 [IU] | Freq: Two times a day (BID) | SUBCUTANEOUS | Status: DC
  Filled 2019-11-15 (×2): qty 0.05

## 2019-11-15 MED ORDER — ETOMIDATE 2 MG/ML IV SOLN
INTRAVENOUS | Status: AC | PRN
  Administered 2019-11-15: 15 mg via INTRAVENOUS

## 2019-11-15 MED ORDER — POTASSIUM PHOSPHATES 15 MMOLE/5ML IV SOLN
20.0000 mmol | Freq: Once | INTRAVENOUS | Status: AC
  Administered 2019-11-15: 20 mmol via INTRAVENOUS
  Filled 2019-11-15: qty 6.67

## 2019-11-15 MED ORDER — INSULIN ASPART 100 UNIT/ML ~~LOC~~ SOLN
2.0000 [IU] | SUBCUTANEOUS | Status: DC
  Administered 2019-11-16: 6 [IU] via SUBCUTANEOUS
  Administered 2019-11-16: 2 [IU] via SUBCUTANEOUS
  Administered 2019-11-16 (×2): 6 [IU] via SUBCUTANEOUS
  Administered 2019-11-16: 4 [IU] via SUBCUTANEOUS
  Administered 2019-11-16 – 2019-11-17 (×3): 6 [IU] via SUBCUTANEOUS
  Administered 2019-11-17: 2 [IU] via SUBCUTANEOUS
  Administered 2019-11-17: 4 [IU] via SUBCUTANEOUS
  Filled 2019-11-15 (×10): qty 1

## 2019-11-15 MED ORDER — ROCURONIUM BROMIDE 50 MG/5ML IV SOLN
80.0000 mg | Freq: Once | INTRAVENOUS | Status: DC
  Filled 2019-11-15: qty 8

## 2019-11-15 MED ORDER — INSULIN DETEMIR 100 UNIT/ML ~~LOC~~ SOLN
5.0000 [IU] | Freq: Two times a day (BID) | SUBCUTANEOUS | Status: DC
  Administered 2019-11-15 – 2019-11-17 (×4): 5 [IU] via SUBCUTANEOUS
  Filled 2019-11-15 (×6): qty 0.05

## 2019-11-15 MED ORDER — INSULIN REGULAR(HUMAN) IN NACL 100-0.9 UT/100ML-% IV SOLN
INTRAVENOUS | Status: DC
  Administered 2019-11-15: 7 [IU]/h via INTRAVENOUS
  Filled 2019-11-15: qty 100

## 2019-11-15 MED ORDER — POLYETHYLENE GLYCOL 3350 17 G PO PACK
17.0000 g | PACK | Freq: Every day | ORAL | Status: DC | PRN
  Administered 2019-11-16: 17 g via ORAL

## 2019-11-15 MED ORDER — SODIUM CHLORIDE 0.9% FLUSH
10.0000 mL | Freq: Two times a day (BID) | INTRAVENOUS | Status: DC
  Administered 2019-11-16 – 2019-11-17 (×4): 10 mL
  Administered 2019-11-17: 30 mL
  Administered 2019-11-18: 10 mL
  Administered 2019-11-18: 20 mL
  Administered 2019-11-19 (×2): 10 mL

## 2019-11-15 MED ORDER — ROCURONIUM BROMIDE 50 MG/5ML IV SOLN
INTRAVENOUS | Status: AC | PRN
  Administered 2019-11-15: 80 mg via INTRAVENOUS

## 2019-11-15 MED ORDER — IPRATROPIUM-ALBUTEROL 0.5-2.5 (3) MG/3ML IN SOLN
3.0000 mL | Freq: Once | RESPIRATORY_TRACT | Status: AC
  Administered 2019-11-15: 3 mL via RESPIRATORY_TRACT
  Filled 2019-11-15: qty 3

## 2019-11-15 MED ORDER — MAGNESIUM SULFATE 50 % IJ SOLN
2.0000 g | Freq: Once | INTRAMUSCULAR | Status: DC
  Filled 2019-11-15: qty 4

## 2019-11-15 MED ORDER — INSULIN REGULAR(HUMAN) IN NACL 100-0.9 UT/100ML-% IV SOLN
INTRAVENOUS | Status: DC
Start: 2019-11-15 — End: 2019-11-15

## 2019-11-15 MED ORDER — ETOMIDATE 2 MG/ML IV SOLN: 15.0000 mg | Freq: Once | INTRAVENOUS | Status: DC

## 2019-11-15 NOTE — Consult Note (Signed)
PHARMACY CONSULT NOTE  Pharmacy Consult for Electrolyte Monitoring and Replacement   Recent Labs: Potassium (mmol/L)  Date Value  11/15/2019 3.1 (L)   Magnesium (mg/dL)  Date Value  37/48/2707 2.2   Calcium (mg/dL)  Date Value  86/75/4492 9.7   Albumin (g/dL)  Date Value  01/00/7121 3.7   Phosphorus (mg/dL)  Date Value  97/58/8325 2.8   Sodium (mmol/L)  Date Value  11/15/2019 139   Corrected calcium: 11 mg/dL (dehydration?) >> 9.7    Assessment: Patient is a 44 y/o F with medical history including diabetes who is admitted for severe DKA. Labs on admission with pH 7, ketonemia, Bicarb 4.9, BG 341, anion gap not calculated, WBC 48. Appears patient will be intubated. Pharmacy has been consulted to assist with electrolyte monitoring and replacement.  Patient is on an insulin infusion. Patient is receiving fluid resuscitation, ordered 3L bolus, on MIVF with LR at 125 mL/hr. Received 1 amp of sodium bicarb.   Goal of Therapy:  K > 4 while on insulin infusion, all other electrolytes WNL  Plan:  --Afternoon BMP reflects potassium decrease from 4.3 to 3.1. Patient has received two runs of IV KCl 10 mEq. Will order enteral KCl 40 mEq x 1 to be given via tube and additional IV KCl x 4 --BMPs ordered q4h while on insulin drip  Tressie Ellis  11/15/2019 2:25 PM

## 2019-11-15 NOTE — ED Notes (Signed)
Patient pulling at IVs, unable to follow instructions. MD informed. MD to bedside. Mitts placed on patient. Armboard placed on right arm

## 2019-11-15 NOTE — ED Notes (Addendum)
This RN called Provider Jones Skene at [phone]. Updated, notified of critical phos and potassium results, notified of duplicate orders, clarified orders. Provider Samaan verbal that he will open chart soon and review, d/c, add orders as needed. This RN will complete once orders in.

## 2019-11-15 NOTE — Consult Note (Signed)
PHARMACY CONSULT NOTE  Pharmacy Consult for Electrolyte Monitoring and Replacement   Recent Labs: Potassium (mmol/L)  Date Value  11/15/2019 2.8 (L)  11/15/2019 2.8 (L)   Magnesium (mg/dL)  Date Value  46/65/9935 1.6 (L)   Calcium (mg/dL)  Date Value  70/17/7939 9.8  11/15/2019 9.7   Albumin (g/dL)  Date Value  03/00/9233 3.7   Phosphorus (mg/dL)  Date Value  00/76/2263 1.1 (L)   Sodium (mmol/L)  Date Value  11/15/2019 141  11/15/2019 141   Corrected calcium: 11 mg/dL (dehydration?) >> 9.7    Assessment: Patient is a 44 y/o F with medical history including diabetes who is admitted for severe DKA. Labs on admission with pH 7, ketonemia, Bicarb 4.9, BG 341, anion gap not calculated, WBC 48. Appears patient will be intubated. Pharmacy has been consulted to assist with electrolyte monitoring and replacement.  Patient is on an insulin infusion. Patient is receiving fluid resuscitation, ordered 3L bolus, on MIVF with LR at 125 mL/hr. Received 1 amp of sodium bicarb.   Goal of Therapy:  K > 4 while on insulin infusion, all other electrolytes WNL  Plan:  --Afternoon BMP reflects potassium decrease from 4.3 to 3.1. Patient has received two runs of IV KCl 10 mEq. Will order enteral KCl 40 mEq x 1 to be given via tube and additional IV KCl x 4 --BMPs ordered q4h while on insulin drip  8/13 @ 1952 :  K  = 2.8,  Mag = 1.6 Will order Mag Sulfate 2 gm IV X 1 @ 2200.  KPhos 20 mmol IV (30 mEq of K+) infusing over 6 hrs , will complete @ ~ 2330. Will order KCl 10 mEq IV X 4 to start on 8/14 @ 0000.   Quiera Diffee D  11/15/2019 10:28 PM

## 2019-11-15 NOTE — ED Notes (Signed)
Pt waking up and trying to pull at tubes. Mits remain in place. This RN called Provider Samman M to notify pt awake and trying to pull tube out. Provider stated he will place orders for propofol momentarily. 2nd RN to ED pyxis now to override.

## 2019-11-15 NOTE — H&P (Signed)
Name: Brittany Bates MRN: [id number] DOB: 06/28/1975     CONSULTATION DATE: 11/15/2019     HISTORY OF PRESENT ILLNESS:  44 years old lady with history of diabetes mellitus, chronic tobacco abuse, COPD, migraine, recent diagnosis of pelvic abscesses status post incision and drainage, recent admission with DKA.  Patient presented to the hospital with 2 days history of shortness of breath, while in the ED she was found to be in profound metabolic acidosis with DKA.  Mental status and respiratory status continued to decline.  By time of critical care evaluation the patient was obtunded not responding to noxious stimulations, was in her second liter of fluid resuscitation and about to be started on insulin drip ordered by the ED provider.  Case was discussed with the ED provider and decision was made to proceed with endotracheal intubation because of profound encephalopathy, respiratory distress with tachypnea and the patient received 50 mEq of sodium bicarbonate.  Medical record was reviewed last the progress notes from surgery was on 8 /6//2021 status post incision and drainage of gluteal abscess. All history was obtained from the emergency room provider and the medical record.  PAST MEDICAL HISTORY :   has a past medical history of Bronchitis, Diabetes mellitus without complication (HCC), and Migraine.  has a past surgical history that includes Abcess removal. Prior to Admission medications   Medication Sig Start Date End Date Taking? Authorizing Provider  cephALEXin (KEFLEX) 500 MG capsule Take 1 capsule (500 mg total) by mouth 4 (four) times daily for 10 days. 11/09/19 11/19/19 Yes Gertha Calkin, MD  metFORMIN (GLUCOPHAGE) 500 MG tablet Take 1 tablet (500 mg total) 2 (two) times daily with a meal by mouth. 02/12/17  Yes Bridget Hartshorn L, PA-C  sitaGLIPtin (JANUVIA) 100 MG tablet Take 0.5 tablets (50 mg total) by mouth daily. 11/09/19 12/09/19 Yes Gertha Calkin, MD  cetirizine (ZYRTEC) 10 MG tablet  Take 1 tablet (10 mg total) by mouth daily. Patient not taking: Reported on 11/06/2019 04/06/17   Cuthriell, Delorise Royals, PA-C  fluticasone (FLONASE) 50 MCG/ACT nasal spray Place 1 spray into both nostrils 2 (two) times daily. Patient not taking: Reported on 11/06/2019 04/06/17   Cuthriell, Delorise Royals, PA-C   Allergies  Allergen Reactions  . Lactose Intolerance (Gi)   . Latex Hives  . Morphine And Related Itching    FAMILY HISTORY:  family history is not on file. SOCIAL HISTORY:  reports that she has been smoking cigarettes. She has been smoking about 0.50 packs per day. She has never used smokeless tobacco. She reports that she does not drink alcohol and does not use drugs.  REVIEW OF SYSTEMS:   Unable to obtain due to critical illness      Estimated body mass index is 22.6 kg/m as calculated from the following:   Height as of this encounter: 5\' 6"  (1.676 m).   Weight as of this encounter: 63.5 kg.    VITAL SIGNS: Temp:  [97.7 F (36.5 C)] 97.7 F (36.5 C) (08/13 0607) Pulse Rate:  [125-143] 127 (08/13 0954) Resp:  [16-40] 32 (08/13 0954) BP: (142-156)/(78-93) 146/80 (08/13 0943) SpO2:  [93 %-100 %] 99 % (08/13 1055) FiO2 (%):  [30 %-40 %] 30 % (08/13 1055) Weight:  [63.5 kg] 63.5 kg (08/13 0608)   No intake/output data recorded. Total I/O In: -  Out: 1000 [Other:1000]   SpO2: 99 % O2 Flow Rate (L/min): 3 L/min FiO2 (%): (S) 30 %   Physical Examination:  Obtunded, does not respond to stimulation, spontaneously moving all extremities On nasal cannula, respiratory distress, acidotic breathing, bilateral equal air entry and no adventitious sounds. S1 & S2 are audible with no murmur. Benign abdominal exam, normal peristalsis, erythema and induration of left superior gluteal crease site of previous I&D No peripheral edema, no inguinal adenopathies   CULTURE RESULTS   Recent Results (from the past 240 hour(s))  SARS Coronavirus 2 by RT PCR (hospital order, performed  in [facility] hospital lab) Nasopharyngeal Nasopharyngeal Swab     Status: None   Collection Time: 11/05/19 11:09 PM   Specimen: Nasopharyngeal Swab  Result Value Ref Range Status   SARS Coronavirus 2 NEGATIVE NEGATIVE Final    Comment: (NOTE) SARS-CoV-2 target nucleic acids are NOT DETECTED.  The SARS-CoV-2 RNA is generally detectable in upper and lower respiratory specimens during the acute phase of infection. The lowest concentration of SARS-CoV-2 viral copies this assay can detect is 250 copies / mL. A negative result does not preclude SARS-CoV-2 infection and should not be used as the sole basis for treatment or other patient management decisions.  A negative result may occur with improper specimen collection / handling, submission of specimen other than nasopharyngeal swab, presence of viral mutation(s) within the areas targeted by this assay, and inadequate number of viral copies (<250 copies / mL). A negative result must be combined with clinical observations, patient history, and epidemiological information.  Fact Sheet for Patients:   BoilerBrush.com.cy  Fact Sheet for Healthcare Providers: https://pope.com/  This test is not yet approved or  cleared by the [place] FDA and has been authorized for detection and/or diagnosis of SARS-CoV-2 by FDA under an Emergency Use Authorization (EUA).  This EUA will remain in effect (meaning this test can be used) for the duration of the COVID-19 declaration under Section 564(b)(1) of the Act, 21 U.S.C. section 360bbb-3(b)(1), unless the authorization is terminated or revoked sooner.  Performed at [facility], [address] [place], [place] [address]   Blood culture (routine x 2)     Status: None   Collection Time: 11/06/19 12:10 AM   Specimen: BLOOD LEFT HAND  Result Value Ref Range Status   Specimen Description BLOOD LEFT HAND  Final   Special Requests   Final      BOTTLES DRAWN AEROBIC AND ANAEROBIC Blood Culture adequate volume   Culture   Final    NO GROWTH 5 DAYS Performed at [facility], [address] [place], [place] [address]    Report Status 11/11/2019 FINAL  Final  Respiratory Panel by RT PCR (Flu A&B, Covid) - Nasopharyngeal Swab     Status: None   Collection Time: 11/15/19  6:24 AM   Specimen: Nasopharyngeal Swab  Result Value Ref Range Status   SARS Coronavirus 2 by RT PCR NEGATIVE NEGATIVE Final    Comment: (NOTE) SARS-CoV-2 target nucleic acids are NOT DETECTED.  The SARS-CoV-2 RNA is generally detectable in upper respiratoy specimens during the acute phase of infection. The lowest concentration of SARS-CoV-2 viral copies this assay can detect is 131 copies/mL. A negative result does not preclude SARS-Cov-2 infection and should not be used as the sole basis for treatment or other patient management decisions. A negative result may occur with  improper specimen collection/handling, submission of specimen other than nasopharyngeal swab, presence of viral mutation(s) within the areas targeted by this assay, and inadequate number of viral copies (<131 copies/mL). A negative result must be combined with clinical observations,  patient history, and epidemiological information. The expected result is Negative.  Fact Sheet for Patients:  https://www.moore.com/https://www.fda.gov/media/142436/download  Fact Sheet for Healthcare Providers:  https://www.young.biz/https://www.fda.gov/media/142435/download  This test is no t yet approved or cleared by the [place] FDA and  has been authorized for detection and/or diagnosis of SARS-CoV-2 by FDA under an Emergency Use Authorization (EUA). This EUA will remain  in effect (meaning this test can be used) for the duration of the COVID-19 declaration under Section 564(b)(1) of the Act, 21 U.S.C. section 360bbb-3(b)(1), unless the authorization is terminated or revoked sooner.     Influenza A by PCR NEGATIVE  NEGATIVE Final   Influenza B by PCR NEGATIVE NEGATIVE Final    Comment: (NOTE) The Xpert Xpress SARS-CoV-2/FLU/RSV assay is intended as an aid in  the diagnosis of influenza from Nasopharyngeal swab specimens and  should not be used as a sole basis for treatment. Nasal washings and  aspirates are unacceptable for Xpert Xpress SARS-CoV-2/FLU/RSV  testing.  Fact Sheet for Patients: https://www.moore.com/https://www.fda.gov/media/142436/download  Fact Sheet for Healthcare Providers: https://www.young.biz/https://www.fda.gov/media/142435/download  This test is not yet approved or cleared by the [place] FDA and  has been authorized for detection and/or diagnosis of SARS-CoV-2 by  FDA under an Emergency Use Authorization (EUA). This EUA will remain  in effect (meaning this test can be used) for the duration of the  Covid-19 declaration under Section 564(b)(1) of the Act, 21  U.S.C. section 360bbb-3(b)(1), unless the authorization is  terminated or revoked. Performed at [facility], [address] [place], [place] [address]           IMAGING    DG Chest Portable 1 View  Result Date: 11/15/2019 CLINICAL DATA:  Hypoxia EXAM: PORTABLE CHEST 1 VIEW COMPARISON:  November 15, 2019 study obtained earlier in the day FINDINGS: Endotracheal tube tip is at the carina. Nasogastric tube tip and side port are below the diaphragm. Central catheter tip is in the superior vena cava. No pneumothorax. Lungs are clear. Heart size and pulmonary vascularity are normal. No adenopathy. No bone lesions. IMPRESSION: Tube and catheter positions as described without pneumothorax. Note that the endotracheal tube tip is at the carina. Advise withdrawing endotracheal tube 2.5 to 3 cm. Lungs clear.  Heart size normal. Electronically Signed   By: Bretta BangWilliam  Woodruff III M.D.   On: 11/15/2019 10:19   DG Chest Portable 1 View  Result Date: 11/15/2019 CLINICAL DATA:  Shortness of breath. EXAM: PORTABLE CHEST 1 VIEW COMPARISON:  One-view chest x-ray  11/05/2019. FINDINGS: Heart size is normal. Lung volumes are low. Mild interstitial prominence is similar the prior exam. IMPRESSION: Stable low lung volumes and mild interstitial prominence. This may represent mild edema. Infection is not excluded. Electronically Signed   By: Marin Robertshristopher  Mattern M.D.   On: 11/15/2019 07:33    Assessment and plan:  DKA. -Optimize glycemic control -Monitor anion gap, pH, lactic acid and beta hydroxybutyrate.  Acute respiratory failure with respiratory acidosis, increased work of breathing and tachypnea. -Intubation, optimize ventilator settings and vent support.  Altered mental status with toxic metabolic encephalopathy and nonfocal neurological exam CT head negative for acute intracranial abnormalities. -Monitor neuro status and optimize metabolic derangement.  Cellulitis of the left superior gluteal site of I&D with concern for suppuration in the collection is possible deep tissue infection. -Follow with CT abdomen and pelvis to rule out deep tissue infection. -Empiric Vanco and Zosyn. -Consider surgical evaluation.  AKI with ATN. -Optimize hydration, avoid nephrotoxins, monitor renal panel and urine  output  Elevated alkaline phosphatase with sepsis  Elevated troponin likely demand versus supply mismatch.  No ST elevation on EKG. -Monitor cardiac enzymes and echocardiogram  Severe sepsis with lactic acidosis and leukocytosis -Empiric antimicrobials with Zosyn and vancomycin -Monitor lactic acid and blood culture  Hemoconcentration with intravascular volume depletion in setting of DKA and sepsis -Optimize hydration and monitor CBC  Reactive thrombocytopenia  DVT & GI prophylaxis.  Continue with supportive care.  Critical care time 35 minutes

## 2019-11-15 NOTE — ED Notes (Signed)
Propofol switched to central line as micromedex confirms it is compatible with everything else currently running through central line.

## 2019-11-15 NOTE — ED Notes (Signed)
Pt relaxed. Pt no longer attempting to pull at lines.

## 2019-11-15 NOTE — ED Notes (Signed)
No answer yet from provider Samaan. This RN will keep trying. Provider's number not in Amion.

## 2019-11-15 NOTE — Consult Note (Signed)
PHARMACY CONSULT NOTE  Pharmacy Consult for Electrolyte Monitoring and Replacement   Recent Labs: Potassium (mmol/L)  Date Value  11/15/2019 4.3   Magnesium (mg/dL)  Date Value  25/49/8264 2.2   Calcium (mg/dL)  Date Value  15/83/0940 10.8 (H)   Albumin (g/dL)  Date Value  76/80/8811 3.7   Phosphorus (mg/dL)  Date Value  06/16/9456 2.8   Sodium (mmol/L)  Date Value  11/15/2019 135   Corrected calcium: 11 mg/dL (dehydration?)   Assessment: Patient is a 44 y/o F with medical history including diabetes who is admitted for severe DKA. Labs on admission with pH 7, ketonemia, Bicarb 4.9, BG 341, anion gap not calculated, WBC 48. Appears patient will be intubated. Pharmacy has been consulted to assist with electrolyte monitoring and replacement.  Patient is on an insulin infusion. Patient is receiving fluid resuscitation, ordered 3L bolus, on MIVF with LR at 125 mL/hr. Received 1 amp of sodium bicarb.   Goal of Therapy:  K > 4 while on insulin infusion, all other electrolytes WNL  Plan:  --Most recent K 4.3, KCl 10 mEq IV x 2 ordered --BMPs ordered q4h while on insulin drip  Tressie Ellis  11/15/2019 10:33 AM

## 2019-11-15 NOTE — ED Notes (Signed)
Provider Duanne Limerick M notified pt uncomfortable; pt jerking arms at times; gagging and looking down at tubes with a grimace. Mits remain in place. Awaiting further orders.

## 2019-11-15 NOTE — Progress Notes (Signed)
Inpatient Diabetes Program Recommendations  AACE/ADA: New Consensus Statement on Inpatient Glycemic Control (2015)  Target Ranges:  Prepandial:   less than 140 mg/dL      Peak postprandial:   less than 180 mg/dL (1-2 hours)      Critically ill patients:  140 - 180 mg/dL   Results for MAYELI, BORNHORST (MRN [id number]) as of 11/15/2019 08:54  Ref. Range 11/15/2019 06:24  Sodium Latest Ref Range: 135 - 145 mmol/L 135  Potassium Latest Ref Range: 3.5 - 5.1 mmol/L 4.3  Chloride Latest Ref Range: 98 - 111 mmol/L 104  CO2 Latest Ref Range: 22 - 32 mmol/L <7 (L)  Glucose Latest Ref Range: 70 - 99 mg/dL 597 (H)  BUN Latest Ref Range: 6 - 20 mg/dL 14  Creatinine Latest Ref Range: 0.44 - 1.00 mg/dL 4.16 (H)  Calcium Latest Ref Range: 8.9 - 10.3 mg/dL 38.4 (H)  Anion gap Latest Ref Range: 5 - 15  NOT CALCULATED   Results for FARRIS, BLASH (MRN [id number]) as of 11/15/2019 08:54  Ref. Range 11/06/2019 03:53  Hemoglobin A1C Latest Ref Range: 4.8 - 5.6 % 13.5 (H)    Admit with: DKA  History: DM  Home DM Meds: Metformin 500 mg BID       Januvia 50 mg Daily  Current Orders: IV Insulin Drip    Just discharged 11/09/2019 after admission for DKA.  Was educated on how to use Insulin pens during that admission and was also counseled extensively by the Diabetes Coordinator about the importance of good CBG control.    Was d/c'd 08/07 with Rxs for Metformin and Januvia (Not insulin).  Likely needs Insulin for home given this is her 2nd admission for DKA in the last 11 days.     --Will follow patient during hospitalization--  Ambrose Finland RN, MSN, CDE Diabetes Coordinator Inpatient Glycemic Control Team Team Pager: [phone] (8a-5p)

## 2019-11-15 NOTE — ED Provider Notes (Signed)
Patient received in signout this morning from Dr. Don Perking.  We looked at the patient in person together, discussed the case with patient's husband in person together.  After about 1.5 hours, no clinical change or improvement.  Patient continues to be encephalopathic requiring constant redirection so she does not pull out her IVs or cause self-harm.  Case discussed with husband and intensivist, and we agreed to move forward with endotracheal intubation and central line placement to facilitate her safety and medical intervention.  Further necessary to allow diagnostic CTs to assess for abscess or signs of deep space necrotizing infection as the precipitant of her DKA.  Patient intubated without complication and then central line placed without complication, as below.  High respiratory rates matched on mechanical vent to hopefully prevent metabolic collapse in the setting of taking her airway.    Clinical Course as of Nov 15 1107  Fri Nov 15, 2019  9381 Called lab regarding metabolic panel. Required re-draw of blood for confirmation.  Na 135 K 4.3 Glu 341 Cr 1.17 Co2 < 7   [DS]  0932 Case discussed with intensivist at the bedside.  His concerns about the patient's mental status and need for intubation, and I agree. Further discussed the case with patient's husband at the bedside, advised him of our recommendations for intubation to facilitate CT imaging and appropriate treatment for her safety.  He expresses understanding and agreement with moving forward with endotracheal intubation to facilitate CT imaging   [DS]  1017 Discussed case with Dr. Jayme Cloud, backup physician in the ICU.  Central line and intubation has been completed for about 20 minutes now   [DS]  1102 Reassessed.  Continues to be hemodynamically stable.  First ABG slightly worsening acidosis, pH was 7 0, now 6.9.  Increased rate on the vent from 32 --> 36.  Medications ongoing via central line.  Awaiting CT imaging.   [DS]     Clinical Course User Index [DS] Delton Prairie, MD    .1-3 Lead EKG Interpretation Performed by: Delton Prairie, MD Authorized by: Delton Prairie, MD     Interpretation: abnormal     ECG rate:  120   ECG rate assessment: tachycardic     Rhythm: sinus tachycardia     Ectopy: none     Conduction: normal   Procedure Name: Intubation Date/Time: 11/15/2019 11:09 AM Performed by: Delton Prairie, MD Pre-anesthesia Checklist: Patient identified, Emergency Drugs available, Suction available, Patient being monitored and Timeout performed Oxygen Delivery Method: Nasal cannula Preoxygenation: Pre-oxygenation with 100% oxygen Induction Type: Rapid sequence Ventilation: Mask ventilation without difficulty Laryngoscope Size: Glidescope and 3 Grade View: Grade I Tube size: 7.5 mm Number of attempts: 1 Airway Equipment and Method: Rigid stylet and Video-laryngoscopy Placement Confirmation: ETT inserted through vocal cords under direct vision,  Positive ETCO2,  CO2 detector and Breath sounds checked- equal and bilateral Secured at: 21 cm Tube secured with: ETT holder Dental Injury: Teeth and Oropharynx as per pre-operative assessment  Difficulty Due To: Difficulty was unanticipated Future Recommendations: Recommend- induction with short-acting agent, and alternative techniques readily available Comments: Black vomitus erupting from the esophagus encountered as I was placing blade in mouth. Immediately suctioned. Visualized vomitus did not descend into the airway. No exudate from the cords.     Marthenia Rolling Line  Date/Time: 11/15/2019 11:12 AM Performed by: Delton Prairie, MD Authorized by: Delton Prairie, MD   Consent:    Consent obtained:  Verbal   Consent given by:  Spouse   Risks discussed:  Arterial puncture, incorrect placement, infection and pneumothorax   Alternatives discussed:  No treatment Pre-procedure details:    Hand hygiene: Hand hygiene performed prior to insertion     Sterile barrier  technique: All elements of maximal sterile technique followed   Anesthesia (see MAR for exact dosages):    Anesthesia method:  Local infiltration   Local anesthetic:  Lidocaine 1% w/o epi Procedure details:    Location:  R internal jugular   Patient position:  Flat   Procedural supplies:  Triple lumen   Landmarks identified: yes     Ultrasound guidance: yes     Sterile ultrasound techniques: Sterile gel and sterile probe covers were used     Number of attempts:  1   Successful placement: yes   Post-procedure details:    Post-procedure:  Dressing applied and line sutured   Assessment:  Blood return through all ports, no pneumothorax on x-ray, free fluid flow and placement verified by x-ray   Patient tolerance of procedure:  Tolerated well, no immediate complications   CRITICAL CARE Performed by: Delton Prairie   Total critical care time: 32 minutes  Critical care time was exclusive of separately billable procedures and treating other patients.  Critical care was necessary to treat or prevent imminent or life-threatening deterioration.  Critical care was time spent personally by me on the following activities: development of treatment plan with patient and/or surrogate as well as nursing, discussions with consultants, evaluation of patient's response to treatment, examination of patient, obtaining history from patient or surrogate, ordering and performing treatments and interventions, ordering and review of laboratory studies, ordering and review of radiographic studies, pulse oximetry and re-evaluation of patient's condition.     Delton Prairie, MD 11/15/19 1115

## 2019-11-15 NOTE — ED Provider Notes (Signed)
[facility] Emergency Department Provider Note  ____________________________________________  Time seen: Approximately 6:24 AM  I have reviewed the triage vital signs and the nursing notes.   HISTORY  Chief Complaint No chief complaint on file.   HPI Brittany Bates is a 44 y.o. female with a history of smoking, COPD, type 2 diabetes, and migraine who presents for evaluation of shortness of breath.  Patient reports SOB for 2 days which became severe this morning.  No wheezing, no cough.  She is complaining of severe chest tightness associated with it.  Patient was recently discharged from the hospital 6 days ago after being admitted in DKA.  She does not take any blood thinners at home.  No personal family history of blood clots, no leg pain or swelling, no hemoptysis or exogenous hormones.   No history of CHF, no history of CAD.  Patient continues to smoke.  Patient is extremely anxious and hyperventilating.  According to the husband who is at bedside, patient is unable to tolerate pain.  Past Medical History:  Diagnosis Date   Bronchitis    Diabetes mellitus without complication (HCC)    Migraine     Patient Active Problem List   Diagnosis Date Noted   Tobacco abuse 11/07/2019   Cellulitis, gluteal, left 11/07/2019   DKA (diabetic ketoacidoses) (HCC) 11/06/2019    Past Surgical History:  Procedure Laterality Date   Abcess removal      Prior to Admission medications   Medication Sig Start Date End Date Taking? Authorizing Provider  cephALEXin (KEFLEX) 500 MG capsule Take 1 capsule (500 mg total) by mouth 4 (four) times daily for 10 days. 11/09/19 11/19/19  Gertha Calkin, MD  cetirizine (ZYRTEC) 10 MG tablet Take 1 tablet (10 mg total) by mouth daily. Patient not taking: Reported on 11/06/2019 04/06/17   Cuthriell, Delorise Royals, PA-C  fluticasone (FLONASE) 50 MCG/ACT nasal spray Place 1 spray into both nostrils 2 (two) times daily. Patient not  taking: Reported on 11/06/2019 04/06/17   Cuthriell, Delorise Royals, PA-C  metFORMIN (GLUCOPHAGE) 500 MG tablet Take 1 tablet (500 mg total) 2 (two) times daily with a meal by mouth. Patient not taking: Reported on 11/06/2019 02/12/17   Bridget Hartshorn L, PA-C  sitaGLIPtin (JANUVIA) 100 MG tablet Take 0.5 tablets (50 mg total) by mouth daily. 11/09/19 12/09/19  Gertha Calkin, MD    Allergies Lactose intolerance (gi), Latex, and Morphine and related  No family history on file.  Social History Social History   Tobacco Use   Smoking status: Current Every Day Smoker    Packs/day: 0.50    Types: Cigarettes   Smokeless tobacco: Never Used  Substance Use Topics   Alcohol use: No   Drug use: No    Review of Systems  Constitutional: Negative for fever. Eyes: Negative for visual changes. ENT: Negative for sore throat. Neck: No neck pain  Cardiovascular: Negative for chest pain. + chest tightness Respiratory: + shortness of breath. Gastrointestinal: Negative for abdominal pain, vomiting or diarrhea. Genitourinary: Negative for dysuria. Musculoskeletal: Negative for back pain. Skin: Negative for rash. Neurological: Negative for headaches, weakness or numbness. Psych: No SI or HI  ____________________________________________   PHYSICAL EXAM:  VITAL SIGNS: ED Triage Vitals  Enc Vitals Group     BP 11/15/19 0607 (!) 152/78     Pulse Rate 11/15/19 0607 (!) 143     Resp 11/15/19 0607 (!) 40     Temp 11/15/19 0607 97.7 F (36.5  C)     Temp Source 11/15/19 0607 Oral     SpO2 11/15/19 0607 93 %     Weight 11/15/19 0608 140 lb (63.5 kg)     Height 11/15/19 0608 5\' 6"  (1.676 m)     Head Circumference --      Peak Flow --      Pain Score 11/15/19 0607 0     Pain Loc --      Pain Edu? --      Excl. in GC? --     Constitutional: Alert and oriented, anxious, crying, hyperventilating.  HEENT:      Head: Normocephalic and atraumatic.         Eyes: Conjunctivae are normal. Sclera is  non-icteric.       Mouth/Throat: Mucous membranes are dry.       Neck: Supple with no signs of meningismus. Cardiovascular: Tachycardic with regular rhythm. Respiratory: Hyperventilating, increased work of breathing, satting 100% on room air, tachypneic to the 40s, lungs are completely clear with no wheezing or crackle and great air movement bilaterally Gastrointestinal: Soft, non tender. Previous site of abscess on buttock shows induration, erythema, warmth with no crepitus, bullae, or fluctuance Musculoskeletal:  No edema, cyanosis, or erythema of extremities. Neurologic: Normal speech and language. Face is symmetric. Moving all extremities. No gross focal neurologic deficits are appreciated. Skin: Skin is warm, dry and intact. No rash noted. Psychiatric: Mood and affect are normal. Speech and behavior are normal.  ____________________________________________   LABS (all labs ordered are listed, but only abnormal results are displayed)  Labs Reviewed  GLUCOSE, CAPILLARY - Abnormal; Notable for the following components:      Result Value   Glucose-Capillary 280 (*)    All other components within normal limits  CBC WITH DIFFERENTIAL/PLATELET - Abnormal; Notable for the following components:   WBC 48.2 (*)    Hemoglobin 15.3 (*)    HCT 47.5 (*)    MCV 102.2 (*)    Platelets 784 (*)    Neutro Abs 39.9 (*)    Monocytes Absolute 3.3 (*)    Abs Immature Granulocytes 2.85 (*)    All other components within normal limits  BLOOD GAS, VENOUS - Abnormal; Notable for the following components:   pH, Ven 7.00 (*)    pCO2, Ven 20 (*)    Bicarbonate 4.9 (*)    Acid-base deficit 24.9 (*)    All other components within normal limits  RESPIRATORY PANEL BY RT PCR (FLU A&B, COVID)  CULTURE, BLOOD (ROUTINE X 2)  CULTURE, BLOOD (ROUTINE X 2)  COMPREHENSIVE METABOLIC PANEL  BETA-HYDROXYBUTYRIC ACID  LACTIC ACID, PLASMA  LACTIC ACID, PLASMA  MAGNESIUM  CBG MONITORING, ED  TROPONIN I (HIGH  SENSITIVITY)  TROPONIN I (HIGH SENSITIVITY)   ____________________________________________  EKG  ED ECG REPORT I, Nita Sicklearolina Kayode Petion, the attending physician, personally viewed and interpreted this ECG.  Sinus tachycardia, rate of 148, prolonged QTC, anterior Q waves, no ST elevation.  No significant changes when compared to prior. ____________________________________________  RADIOLOGY  I have personally reviewed the images performed during this visit and I agree with the Radiologist's read.   Interpretation by Radiologist:  DG Chest Portable 1 View  Result Date: 11/15/2019 CLINICAL DATA:  Shortness of breath. EXAM: PORTABLE CHEST 1 VIEW COMPARISON:  One-view chest x-ray 11/05/2019. FINDINGS: Heart size is normal. Lung volumes are low. Mild interstitial prominence is similar the prior exam. IMPRESSION: Stable low lung volumes and mild interstitial prominence. This may represent mild  edema. Infection is not excluded. Electronically Signed   By: Marin Roberts M.D.   On: 11/15/2019 07:33    ____________________________________________   PROCEDURES  Procedure(s) performed:yes .1-3 Lead EKG Interpretation Performed by: Nita Sickle, MD Authorized by: Nita Sickle, MD     Interpretation: non-specific     ECG rate assessment: tachycardic     Rhythm: sinus tachycardia     Ectopy: none     Critical Care performed: yes  CRITICAL CARE Performed by: Nita Sickle  ?  Total critical care time: 40 min  Critical care time was exclusive of separately billable procedures and treating other patients.  Critical care was necessary to treat or prevent imminent or life-threatening deterioration.  Critical care was time spent personally by me on the following activities: development of treatment plan with patient and/or surrogate as well as nursing, discussions with consultants, evaluation of patient's response to treatment, examination of patient, obtaining  history from patient or surrogate, ordering and performing treatments and interventions, ordering and review of laboratory studies, ordering and review of radiographic studies, pulse oximetry and re-evaluation of patient's condition.  ____________________________________________   INITIAL IMPRESSION / ASSESSMENT AND PLAN / ED COURSE   44 y.o. female with a history of smoking, COPD, type 2 diabetes, and migraine who presents for evaluation of shortness of breath.  Patient looks extremely dry on exam, hyperventilating, anxious, with increased work of breathing but normal sats and completely clear lungs with no wheezing or crackles and great air movement.  Patient keeps saying "I cannot breathe."  Differential diagnoses including PE versus COPD exacerbation versus bronchitis versus pneumonia versus COVID versus ACS versus pneumothorax versus edema.  Also possibly DKA considering the patient was recently admitted for that however I would not expect her to be feeling short of breath just hyperventilating. According to husband she presented the same last time when she was in DKA. She will also need CT a/p to eval the buttock infection.  VBG showing pH of 7.00 with a bicarb of less than 4.9 consistent with DKA.  Will start patient on IV fluids.  Will wait for chemistry panel before starting patient insulin to check her potassium levels.  We will send patient for CT Angie of the chest to rule out PE.  Will monitor patient closely on telemetry.  ED COURSE:  After 30 min of being in the ED, patient became severely encephalopathic, requiring mittens and unable to follow commands concerning for cerebral edema. Patient really needs a CT head, CTA chest, and CT a/p however with her encephalopathy she is unable to sit still to undergo CT. Mittens were placed to prevent her from pulling her IV's out.  I had a very serious discussion with the husband and gave him 2 options.  Option #1 is to put patient on the  ventilator so we can sedate and paralyze her and get the pictures that we need.  I discussed the risks associated with intubation and being placed on a ventilator.  Option #2 was to reassess patient in a couple of hours to see if her mental status clears after the acidosis improves however I did explain to him that if you choose this option there is a chance we might miss severe brain edema, PE, or necrotizing infection of her buttock region especially with elevated white count and delay treatment may lead to significant morbidity or mortality.  Patient understands the 2 options and the risks associated with both of them.  At this time he wishes to  not intubate patient and hold on imaging.  I do not feel comfortable starting patient on heparin (for possible concerns of PE) until head CT is done especially since she is so encephalopathic and could have pretty significant edema of the brain.   History gathered from patient her husband is at bedside, plan discussed with both of them.  Old medical records reviewed. Care transferred to Dr. Katrinka Blazing at 8:15AM. Dr. Katrinka Blazing was also present in the room when I had this conversation with the husband and plan was discussed with him as well.       _____________________________________________ Please note:  Patient was evaluated in Emergency Department today for the symptoms described in the history of present illness. Patient was evaluated in the context of the global COVID-19 pandemic, which necessitated consideration that the patient might be at risk for infection with the SARS-CoV-2 virus that causes COVID-19. Institutional protocols and algorithms that pertain to the evaluation of patients at risk for COVID-19 are in a state of rapid change based on information released by regulatory bodies including the CDC and federal and state organizations. These policies and algorithms were followed during the patient's care in the ED.  Some ED evaluations and interventions may be  delayed as a result of limited staffing during the pandemic.   [place] Controlled Substance Database was reviewed by me. ____________________________________________   FINAL CLINICAL IMPRESSION(S) / ED DIAGNOSES   Final diagnoses:  Diabetic ketoacidosis without coma associated with type 2 diabetes mellitus (HCC)  Sinus tachycardia  Delirium  Metabolic acidosis  Neutrophilia      NEW MEDICATIONS STARTED DURING THIS VISIT:  ED Discharge Orders    None       Note:  This document was prepared using Dragon voice recognition software and may include unintentional dictation errors.    Don Perking, [place], MD 11/15/19 [phone]

## 2019-11-15 NOTE — ED Notes (Signed)
Quarry manager for provider Samaan's direct number for this RN as no reply to this RN's secure chat updates and inquiries.

## 2019-11-15 NOTE — ED Notes (Signed)
Attempted to call Resp therapy twice to notify VBG sent to lab. No answer. Will try again soon.

## 2019-11-15 NOTE — ED Notes (Signed)
Pt is unresponsive.  purewick placed after cleaning patinet and new sheets.  She has decub on sacrum.

## 2019-11-15 NOTE — ED Triage Notes (Signed)
Pt to triage via w/c, hyperventilating; c/o SHOB x 2 days; denies any other accomp symptoms; pt d/c recently for DKA and sepsis

## 2019-11-15 NOTE — ED Notes (Addendum)
Patient is unresponsive.  Her right antecubital iv was pulled out at some point.  So changed d5lr and fentanyl to IJ.  Her bed needed changed again, as the purewick was not properly hooked up.  I have hooked it up and changed her.

## 2019-11-15 NOTE — Progress Notes (Signed)
Transported pt to and back from CT with no issues.

## 2019-11-15 NOTE — ED Notes (Signed)
Will stopped insulin gtt once properly overlapped with subcut short & long-acting insulin.

## 2019-11-16 ENCOUNTER — Inpatient Hospital Stay: Payer: BLUE CROSS/BLUE SHIELD

## 2019-11-16 LAB — GLUCOSE, CAPILLARY
Glucose-Capillary: 131 mg/dL — ABNORMAL HIGH (ref 70–99)
Glucose-Capillary: 158 mg/dL — ABNORMAL HIGH (ref 70–99)
Glucose-Capillary: 161 mg/dL — ABNORMAL HIGH (ref 70–99)
Glucose-Capillary: 201 mg/dL — ABNORMAL HIGH (ref 70–99)
Glucose-Capillary: 209 mg/dL — ABNORMAL HIGH (ref 70–99)
Glucose-Capillary: 220 mg/dL — ABNORMAL HIGH (ref 70–99)
Glucose-Capillary: 243 mg/dL — ABNORMAL HIGH (ref 70–99)

## 2019-11-16 LAB — CBC
HCT: 31.4 % — ABNORMAL LOW (ref 36.0–46.0)
Hemoglobin: 10.8 g/dL — ABNORMAL LOW (ref 12.0–15.0)
MCH: 32.4 pg (ref 26.0–34.0)
MCHC: 34.4 g/dL (ref 30.0–36.0)
MCV: 94.3 fL (ref 80.0–100.0)
Platelets: 435 10*3/uL — ABNORMAL HIGH (ref 150–400)
RBC: 3.33 MIL/uL — ABNORMAL LOW (ref 3.87–5.11)
RDW: 12.5 % (ref 11.5–15.5)
WBC: 32.9 10*3/uL — ABNORMAL HIGH (ref 4.0–10.5)
nRBC: 0 % (ref 0.0–0.2)

## 2019-11-16 LAB — BLOOD GAS, ARTERIAL
Acid-base deficit: 7.5 mmol/L — ABNORMAL HIGH (ref 0.0–2.0)
Acid-base deficit: 3.6 mmol/L — ABNORMAL HIGH (ref 0.0–2.0)
Bicarbonate: 17.1 mmol/L — ABNORMAL LOW (ref 20.0–28.0)
Bicarbonate: 21.5 mmol/L (ref 20.0–28.0)
FIO2: 0.21
FIO2: 0.21
MECHVT: 400 mL
MECHVT: 400 mL
O2 Saturation: 97.9 %
O2 Saturation: 96.7 %
PEEP: 5 cmH2O
Patient temperature: 37
Patient temperature: 37
RATE: 30 resp/min
RATE: 24 resp/min
pCO2 arterial: 31 mmHg — ABNORMAL LOW (ref 32.0–48.0)
pCO2 arterial: 38 mmHg (ref 32.0–48.0)
pH, Arterial: 7.35 (ref 7.350–7.450)
pH, Arterial: 7.36 (ref 7.350–7.450)
pO2, Arterial: 108 mmHg (ref 83.0–108.0)
pO2, Arterial: 91 mmHg (ref 83.0–108.0)

## 2019-11-16 LAB — BASIC METABOLIC PANEL
Anion gap: 7 (ref 5–15)
Anion gap: 10 (ref 5–15)
BUN: 12 mg/dL (ref 6–20)
BUN: 13 mg/dL (ref 6–20)
CO2: 21 mmol/L — ABNORMAL LOW (ref 22–32)
CO2: 21 mmol/L — ABNORMAL LOW (ref 22–32)
Calcium: 9.7 mg/dL (ref 8.9–10.3)
Calcium: 9.1 mg/dL (ref 8.9–10.3)
Chloride: 114 mmol/L — ABNORMAL HIGH (ref 98–111)
Chloride: 111 mmol/L (ref 98–111)
Creatinine, Ser: 0.44 mg/dL (ref 0.44–1.00)
Creatinine, Ser: 0.42 mg/dL — ABNORMAL LOW (ref 0.44–1.00)
GFR calc Af Amer: >60 mL/min (ref >60)
GFR calc Af Amer: >60 mL/min (ref >60)
GFR calc non Af Amer: >60 mL/min (ref >60)
GFR calc non Af Amer: >60 mL/min (ref >60)
Glucose, Bld: 146 mg/dL — ABNORMAL HIGH (ref 70–99)
Glucose, Bld: 148 mg/dL — ABNORMAL HIGH (ref 70–99)
Potassium: 3.6 mmol/L (ref 3.5–5.1)
Potassium: 3.9 mmol/L (ref 3.5–5.1)
Sodium: 142 mmol/L (ref 135–145)
Sodium: 142 mmol/L (ref 135–145)

## 2019-11-16 LAB — MAGNESIUM
Magnesium: 2.7 mg/dL — ABNORMAL HIGH (ref 1.7–2.4)
Magnesium: 2.3 mg/dL (ref 1.7–2.4)
Magnesium: 2.3 mg/dL (ref 1.7–2.4)

## 2019-11-16 LAB — BETA-HYDROXYBUTYRIC ACID: Beta-Hydroxybutyric Acid: 0.38 mmol/L — ABNORMAL HIGH (ref 0.05–0.27)

## 2019-11-16 LAB — MRSA PCR SCREENING: MRSA by PCR: NEGATIVE

## 2019-11-16 LAB — PHOSPHORUS
Phosphorus: 2.4 mg/dL — ABNORMAL LOW (ref 2.5–4.6)
Phosphorus: 2.3 mg/dL — ABNORMAL LOW (ref 2.5–4.6)
Phosphorus: 2.6 mg/dL (ref 2.5–4.6)

## 2019-11-16 MED ORDER — SODIUM BICARBONATE 8.4 % IV SOLN
100.0000 meq | Freq: Once | INTRAVENOUS | Status: AC
  Administered 2019-11-16: 100 meq via INTRAVENOUS
  Filled 2019-11-16: qty 100

## 2019-11-16 MED ORDER — ACETAMINOPHEN 325 MG PO TABS
ORAL_TABLET | ORAL | Status: AC
  Administered 2019-11-16: 650 mg via ORAL
  Filled 2019-11-16: qty 2

## 2019-11-16 MED ORDER — POTASSIUM & SODIUM PHOSPHATES 280-160-250 MG PO PACK
2.0000 | PACK | Freq: Once | ORAL | Status: AC
  Administered 2019-11-16: 2
  Filled 2019-11-16: qty 2

## 2019-11-16 MED ORDER — IPRATROPIUM-ALBUTEROL 0.5-2.5 (3) MG/3ML IN SOLN
3.0000 mL | RESPIRATORY_TRACT | Status: DC
  Administered 2019-11-16 (×2): 3 mL via RESPIRATORY_TRACT
  Filled 2019-11-16 (×2): qty 3

## 2019-11-16 MED ORDER — LACTATED RINGERS IV SOLN: INTRAVENOUS | Status: DC

## 2019-11-16 MED ORDER — IPRATROPIUM-ALBUTEROL 0.5-2.5 (3) MG/3ML IN SOLN: 3.0000 mL | RESPIRATORY_TRACT | Status: DC | PRN

## 2019-11-16 MED ORDER — PIPERACILLIN-TAZOBACTAM 3.375 G IVPB
3.3750 g | Freq: Three times a day (TID) | INTRAVENOUS | Status: DC
  Administered 2019-11-16 – 2019-11-20 (×14): 3.375 g via INTRAVENOUS
  Filled 2019-11-16 (×13): qty 50

## 2019-11-16 MED ORDER — VANCOMYCIN HCL 1250 MG/250ML IV SOLN
1250.0000 mg | Freq: Two times a day (BID) | INTRAVENOUS | Status: DC
  Administered 2019-11-16 – 2019-11-17 (×3): 1250 mg via INTRAVENOUS
  Filled 2019-11-16 (×5): qty 250

## 2019-11-16 MED ORDER — METHYLPREDNISOLONE SODIUM SUCC 125 MG IJ SOLR
60.0000 mg | INTRAMUSCULAR | Status: AC
  Administered 2019-11-17: 60 mg via INTRAVENOUS
  Filled 2019-11-16: qty 2

## 2019-11-16 MED ORDER — BUDESONIDE 0.5 MG/2ML IN SUSP
0.5000 mg | Freq: Two times a day (BID) | RESPIRATORY_TRACT | Status: DC
  Administered 2019-11-16: 0.5 mg via RESPIRATORY_TRACT
  Filled 2019-11-16: qty 2

## 2019-11-16 MED ORDER — METHYLPREDNISOLONE SODIUM SUCC 125 MG IJ SOLR
60.0000 mg | Freq: Two times a day (BID) | INTRAMUSCULAR | Status: DC
  Administered 2019-11-16: 60 mg via INTRAVENOUS
  Filled 2019-11-16: qty 2

## 2019-11-16 MED ORDER — ACETAMINOPHEN 325 MG PO TABS
650.0000 mg | ORAL_TABLET | Freq: Four times a day (QID) | ORAL | Status: DC | PRN
  Administered 2019-11-17 – 2019-11-20 (×9): 650 mg via ORAL
  Filled 2019-11-16 (×9): qty 2

## 2019-11-16 NOTE — Progress Notes (Addendum)
Pharmacy Antibiotic Note  Brittany Bates is a 44 y.o. female admitted on 11/15/2019 with sepsis.  Pharmacy has been consulted for /vanc/zosyn dosing.  Plan: Patient received vanc 1g IV x 1 on 8/13 at 0843. Will start vanc 1.25g IV q12h and will monitor renal function and adjust doses per changes in renal function. Zosyn 3.375g IV q8h (4 hour infusion).  Will monitor renal function closely as this abx combo increases the incidence of AKI.  Ke 0.0748 T1/2 ~ 12 hrs Goal trough 15 - 20 mcg/mL.  Height: 5\' 6"  (167.6 cm) Weight: 60.2 kg (132 lb 11.5 oz) IBW/kg (Calculated) : 59.3  Temp (24hrs), Avg:99 F (37.2 C), Min:97 F (36.1 C), Max:101.8 F (38.8 C)  Recent Labs  Lab 11/09/19 0644 11/15/19 0624 11/15/19 0800 11/15/19 1357 11/15/19 1609 11/15/19 1952  WBC 9.3 48.2*  --   --  45.1*  --   CREATININE 0.55 1.17*  --  0.76 0.53 0.45   0.49  LATICACIDVEN  --   --  3.0*  --  1.8  --     Estimated Creatinine Clearance: 84.9 mL/min (by C-G formula based on SCr of 0.49 mg/dL).    Allergies  Allergen Reactions   Lactose Intolerance (Gi)    Latex Hives   Morphine And Related Itching     Thank you for allowing pharmacy to be a part of this patients care.  11/17/19, PharmD, BCPS Clinical Pharmacist 11/16/2019 12:19 AM

## 2019-11-16 NOTE — Progress Notes (Signed)
Pt extubated to RA, doing well so far.

## 2019-11-16 NOTE — Progress Notes (Signed)
Pt extubated per MD order. Pt placed on RA with sats in high 90's

## 2019-11-16 NOTE — Progress Notes (Signed)
Name: Brittany AriasSabrina C Bates MRN: [id number] DOB: 02-14-1976     CONSULTATION DATE: 11/15/2019 Subjective and objective: Afebrile, on the ventilator, off insulin drip, anion gap closed and has been transitioned to intermediate acting insulin.   PAST MEDICAL HISTORY :   has a past medical history of Bronchitis, Diabetes mellitus without complication (HCC), and Migraine.  has a past surgical history that includes Abcess removal. Prior to Admission medications   Medication Sig Start Date End Date Taking? Authorizing Provider  cephALEXin (KEFLEX) 500 MG capsule Take 1 capsule (500 mg total) by mouth 4 (four) times daily for 10 days. 11/09/19 11/19/19 Yes Gertha CalkinPatel, Ekta V, MD  metFORMIN (GLUCOPHAGE) 500 MG tablet Take 1 tablet (500 mg total) 2 (two) times daily with a meal by mouth. 02/12/17  Yes Bridget HartshornSummers, Rhonda L, PA-C  sitaGLIPtin (JANUVIA) 100 MG tablet Take 0.5 tablets (50 mg total) by mouth daily. 11/09/19 12/09/19 Yes Gertha CalkinPatel, Ekta V, MD  cetirizine (ZYRTEC) 10 MG tablet Take 1 tablet (10 mg total) by mouth daily. Patient not taking: Reported on 11/06/2019 04/06/17   Cuthriell, Delorise RoyalsJonathan D, PA-C  fluticasone (FLONASE) 50 MCG/ACT nasal spray Place 1 spray into both nostrils 2 (two) times daily. Patient not taking: Reported on 11/06/2019 04/06/17   Cuthriell, Delorise RoyalsJonathan D, PA-C   Allergies  Allergen Reactions  . Lactose Intolerance (Gi)   . Latex Hives  . Morphine And Related Itching    FAMILY HISTORY:  family history is not on file. SOCIAL HISTORY:  reports that she has been smoking cigarettes. She has been smoking about 0.50 packs per day. She has never used smokeless tobacco. She reports that she does not drink alcohol and does not use drugs.  REVIEW OF SYSTEMS:   Unable to obtain due to critical illness      Estimated body mass index is 21.97 kg/m as calculated from the following:   Height as of this encounter: 5' 5.98" (1.676 m).   Weight as of this encounter: 61.7 kg.    VITAL SIGNS: Temp:   [97 F (36.1 C)-101.8 F (38.8 C)] 98.2 F (36.8 C) (08/14 0834) Pulse Rate:  [84-132] 105 (08/14 0900) Resp:  [15-32] 20 (08/14 0900) BP: (87-163)/(56-86) 125/79 (08/14 0834) SpO2:  [98 %-100 %] 100 % (08/14 0834) FiO2 (%):  [21 %-40 %] 21 % (08/14 0834) Weight:  [60.2 kg-61.7 kg] 61.7 kg (08/14 0500)   I/O last 3 completed shifts: In: 7846.3 [I.V.:3396.2; IV Piggyback:4450.1] Out: 2375 [Urine:1375; Other:1000] Total I/O In: 162.1 [I.V.:149.6; IV Piggyback:12.5] Out: 150 [Urine:150]   SpO2: 100 % O2 Flow Rate (L/min): 3 L/min FiO2 (%): 21 %   Physical Examination:  Off sedation following command and no focal neurological deficits On vent, no respiratory distress, bilateral equal air entry and no adventitious sounds. S1 & S2 are audible with no murmur. Benign abdominal exam, normal peristalsis, resolved erythema and induration of left superior gluteal crease site of previous I&D No peripheral edema, no inguinal adenopathies    CULTURE RESULTS   Recent Results (from the past 240 hour(s))  Respiratory Panel by RT PCR (Flu A&B, Covid) - Nasopharyngeal Swab     Status: None   Collection Time: 11/15/19  6:24 AM   Specimen: Nasopharyngeal Swab  Result Value Ref Range Status   SARS Coronavirus 2 by RT PCR NEGATIVE NEGATIVE Final    Comment: (NOTE) SARS-CoV-2 target nucleic acids are NOT DETECTED.  The SARS-CoV-2 RNA is generally detectable in upper respiratoy specimens during the acute phase of infection.  The lowest concentration of SARS-CoV-2 viral copies this assay can detect is 131 copies/mL. A negative result does not preclude SARS-Cov-2 infection and should not be used as the sole basis for treatment or other patient management decisions. A negative result may occur with  improper specimen collection/handling, submission of specimen other than nasopharyngeal swab, presence of viral mutation(s) within the areas targeted by this assay, and inadequate number of viral  copies (<131 copies/mL). A negative result must be combined with clinical observations, patient history, and epidemiological information. The expected result is Negative.  Fact Sheet for Patients:  https://www.moore.com/  Fact Sheet for Healthcare Providers:  https://www.young.biz/  This test is no t yet approved or cleared by the [place] FDA and  has been authorized for detection and/or diagnosis of SARS-CoV-2 by FDA under an Emergency Use Authorization (EUA). This EUA will remain  in effect (meaning this test can be used) for the duration of the COVID-19 declaration under Section 564(b)(1) of the Act, 21 U.S.C. section 360bbb-3(b)(1), unless the authorization is terminated or revoked sooner.     Influenza A by PCR NEGATIVE NEGATIVE Final   Influenza B by PCR NEGATIVE NEGATIVE Final    Comment: (NOTE) The Xpert Xpress SARS-CoV-2/FLU/RSV assay is intended as an aid in  the diagnosis of influenza from Nasopharyngeal swab specimens and  should not be used as a sole basis for treatment. Nasal washings and  aspirates are unacceptable for Xpert Xpress SARS-CoV-2/FLU/RSV  testing.  Fact Sheet for Patients: https://www.moore.com/  Fact Sheet for Healthcare Providers: https://www.young.biz/  This test is not yet approved or cleared by the [place] FDA and  has been authorized for detection and/or diagnosis of SARS-CoV-2 by  FDA under an Emergency Use Authorization (EUA). This EUA will remain  in effect (meaning this test can be used) for the duration of the  Covid-19 declaration under Section 564(b)(1) of the Act, 21  U.S.C. section 360bbb-3(b)(1), unless the authorization is  terminated or revoked. Performed at [facility], [address] [place], [place] [address]   Blood culture (routine x 2)     Status: None (Preliminary result)   Collection Time: 11/15/19  8:00 AM   Specimen:  BLOOD  Result Value Ref Range Status   Specimen Description BLOOD BLOOD RIGHT HAND  Final   Special Requests   Final    BOTTLES DRAWN AEROBIC AND ANAEROBIC Blood Culture adequate volume   Culture   Final    NO GROWTH 1 DAY Performed at [facility], [address] [place], [place] [address]    Report Status PENDING  Incomplete  Blood culture (routine x 2)     Status: None (Preliminary result)   Collection Time: 11/15/19  8:38 AM   Specimen: BLOOD  Result Value Ref Range Status   Specimen Description BLOOD BLOOD RIGHT HAND  Final   Special Requests   Final    BOTTLES DRAWN AEROBIC AND ANAEROBIC Blood Culture adequate volume   Culture   Final    NO GROWTH < 24 HOURS Performed at [facility], [address] [place], [place] [address]    Report Status PENDING  Incomplete  MRSA PCR Screening     Status: None   Collection Time: 11/15/19 10:58 PM   Specimen: Nasopharyngeal  Result Value Ref Range Status   MRSA by PCR NEGATIVE NEGATIVE Final    Comment:        The GeneXpert MRSA Assay (FDA approved for NASAL specimens only), is one component  of a comprehensive MRSA colonization surveillance program. It is not intended to diagnose MRSA infection nor to guide or monitor treatment for MRSA infections. Performed at [facility], [address] [place], [place] [address]           IMAGING    CT Head Wo Contrast  Result Date: 11/15/2019 CLINICAL DATA:  Altered mental status EXAM: CT HEAD WITHOUT CONTRAST TECHNIQUE: Contiguous axial images were obtained from the base of the skull through the vertex without intravenous contrast. COMPARISON:  None. FINDINGS: Brain: Ventricles and sulci appear normal in size and configuration. There is no intracranial mass, hemorrhage, extra-axial fluid collection, or midline shift. The brain parenchyma appears normal. No acute infarct evident. Vascular: No hyperdense vessel.  No evident vascular calcification. Skull:  Bony calvarium appears intact. Sinuses/Orbits: There is mucosal thickening in several ethmoid air cells. Other visualized paranasal sinuses are clear. Visualized orbits are symmetric. Other: Mastoid air cells are clear. IMPRESSION: Normal appearing brain parenchyma. No evident acute infarct. No mass or hemorrhage. Mucosal thickening noted in several ethmoid air cells. Electronically Signed   By: Bretta Bang III M.D.   On: 11/15/2019 12:13   CT ABDOMEN PELVIS W CONTRAST  Result Date: 11/15/2019 CLINICAL DATA:  Pt intubated and unable to answer any medical hx questions. Per MD note "WINNI EHRHARD is a 44 y.o. female with a history of smoking, COPD, type 2 diabetes, and migraine who presents for evaluation of shortness of breath. Patient reports SOB for 2 days which became severe this morning. No wheezing, no cough. She is complaining of severe chest tightness associated with it. Patient was recently discharged from the hospital 6 days ago after being admitted in DKA. EXAM: CT ABDOMEN AND PELVIS WITH CONTRAST TECHNIQUE: Multidetector CT imaging of the abdomen and pelvis was performed using the standard protocol following bolus administration of intravenous contrast. CONTRAST:  OMNIPAQUE IOHEXOL 300 MG/ML  SOLN COMPARISON:  11/06/2019 FINDINGS: Lower chest: Minor dependent lower lobe atelectasis. No acute findings at the lung bases. Hepatobiliary: No focal liver abnormality is seen. No gallstones, gallbladder wall thickening, or biliary dilatation. Pancreas: Unremarkable. No pancreatic ductal dilatation or surrounding inflammatory changes. Spleen: Normal in size without focal abnormality. 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 Normal right adrenal gland. Linear focus of soft tissue attenuation lies just below the left adrenal gland which may reflect minimal residual left renal tissue. No normal left kidney seen. Right kidney is relatively large consistent with  compensatory hypertrophy. Multiple small right intrarenal collecting system calcifications. No right renal masses. There are subtle areas of relative decreased enhancement/attenuation in the right kidney, more apparent on the delayed sequence. Mild prominence of the right intrarenal collecting system and right ureter. No ureteral stone. No perinephric stranding, fluid or abscess. There is non dependent air within the bladder. No bladder wall thickening, mass or stone. Stomach/Bowel: Nasogastric tube tip lies in distal stomach. Stomach otherwise unremarkable. Small bowel and colon are normal in caliber. No wall thickening. No inflammation. No evidence of appendicitis. Vascular/Lymphatic: Aortic atherosclerosis. No aneurysm. No enlarged lymph nodes. Reproductive: Small abnormal appearing uterus with multiple low-attenuation areas with peripheral enhancement, unchanged from the prior CT. No adnexal masses. Other: Ill-defined and incompletely imaged inflammatory lesion of the subcutaneous fat centered on the left medial buttock, extending across the midline, posterior to the rectum and levator sling. This measures approximately 8.7 x 5.1 cm in greatest transverse dimension, and is clearly progressed when compared to the prior CT. No  defined abscess, but the process is not completely covered on this study. No abdominal wall hernia.  No ascites. Musculoskeletal: No fracture or bone lesion. No evidence of osteomyelitis. No significant skeletal abnormality. IMPRESSION: 1. Subtle areas of relative decreased enhancement/attenuation of the right kidney, more evident on the delayed sequence. Consider pyelonephritis in the proper clinical setting. No evidence of a renal or perirenal abscess. No obstructive uropathy. 2. Non dependent air within the bladder with no bladder wall thickening or mass. This may be from recent instrumentation. 3. Interval worsening of subcutaneous buttock and perineal inflammation, incompletely imaged  on current exam, remaining peripheral/inferior to the anus and rectum and levator sling, spanning approximately 8.7 x 5.1 cm. No defined abscess. 4. Abnormal appearance of the uterus with a small uterus and small hypoattenuating lesions. 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 Suspect both of these abnormalities to be developmental. 5. Small nonobstructing stones in the right kidney. 6. Well-positioned nasogastric tube mild dependent lung base atelectasis. 7. Aortic atherosclerosis. Electronically Signed   By: Amie Portland M.D.   On: 11/15/2019 12:24   DG Chest Port 1 View  Result Date: 11/16/2019 CLINICAL DATA:  Hypoxia EXAM: PORTABLE CHEST 1 VIEW COMPARISON:  11/15/2019 FINDINGS: Cardiac shadow is stable. Endotracheal tube, gastric catheter and right jugular central line are again seen and stable. Lungs are well aerated bilaterally. No focal confluent infiltrate or sizable effusion is noted. No bony abnormality is noted. IMPRESSION: Tubes and lines as described above.  No acute infiltrate is seen. Electronically Signed   By: Alcide Clever M.D.   On: 11/16/2019 06:22   DG Chest Portable 1 View  Result Date: 11/15/2019 CLINICAL DATA:  Hypoxia EXAM: PORTABLE CHEST 1 VIEW COMPARISON:  November 15, 2019 study obtained earlier in the day FINDINGS: Endotracheal tube tip is at the carina. Nasogastric tube tip and side port are below the diaphragm. Central catheter tip is in the superior vena cava. No pneumothorax. Lungs are clear. Heart size and pulmonary vascularity are normal. No adenopathy. No bone lesions. IMPRESSION: Tube and catheter positions as described without pneumothorax. Note that the endotracheal tube tip is at the carina. Advise withdrawing endotracheal tube 2.5 to 3 cm. Lungs clear.  Heart size normal. Electronically Signed   By: Bretta Bang III M.D.   On: 11/15/2019 10:19    Assessment and plan:  DKA.(resolved) off insulin drip and has been transitioned to  intermediate acting insulin. -Optimize glycemic control -Monitor anion gap, pH, lactic acid and beta hydroxybutyrate.  Acute respiratory failure with respiratory acidosis, increased work of breathing and tachypnea.  (Improved) -SBT and assist for weaning.  Altered mental status with toxic metabolic encephalopathy and nonfocal neurological exam CT head negative for acute intracranial abnormalities.  (Improved) following command and no focal motor deficits -Monitor neuro status and optimize metabolic derangement.  Cellulitis of the left superior gluteal site of I&D with concern for suppuration in the collection is possible deep tissue infection.  Improved with resolution of the erythema. -CT abd & pel with contrast: 1. Subtle areas of relative decreased enhancement/attenuation of the right kidney, more evident on the delayed sequence. Consider pyelonephritis in the proper clinical setting. No evidence of a renal or perirenal abscess. No obstructive uropathy. 2. Non dependent air within the bladder with no bladder wall thickening or mass. This may be from recent instrumentation. 3. Interval worsening of subcutaneous buttock and perineal inflammation, incompletely imaged on current exam, remaining peripheral/inferior to the anus and rectum and levator  sling, spanning approximately 8.7 x 5.1 cm. No defined abscess. 4. Abnormal appearance of the uterus with a small uterus and small hypoattenuating lesions. 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 Suspect both of these abnormalities to be developmental. 5. Small nonobstructing stones in the right kidney. 6. Well-positioned nasogastric tube mild dependent lung base atelectasis. 7. Aortic atherosclerosis. -Empiric Vanco and Zosyn. -Follow with surgical consult   AKI with ATN. (Improved) -Optimize hydration, avoid nephrotoxins, monitor renal panel and urine output  Elevated alkaline phosphatase with  sepsis  Elevated troponin likely demand versus supply mismatch.  No ST elevation on EKG. -Monitor cardiac enzymes and echocardiogram  Severe sepsis with lactic acidosis and leukocytosis (improved). Lactic acid trended down to normal. Blood culture 11/15/2019 no growth. -Empiric antimicrobials with Zosyn and vancomycin -Monitor blood culture  Hemoconcentration with intravascular volume depletion in setting of DKA and sepsis -Optimize hydration and monitor CBC  Reactive thrombocytopenia  DVT & GI prophylaxis.  Continue with supportive care.  Critical care time 35 minutes

## 2019-11-16 NOTE — Consult Note (Signed)
PHARMACY CONSULT NOTE  Pharmacy Consult for Electrolyte Monitoring and Replacement   Recent Labs: Potassium (mmol/L)  Date Value  11/16/2019 3.9   Magnesium (mg/dL)  Date Value  25/36/6440 2.3   Calcium (mg/dL)  Date Value  34/74/2595 9.1   Albumin (g/dL)  Date Value  63/87/5643 3.7   Phosphorus (mg/dL)  Date Value  32/95/1884 2.3 (L)   Sodium (mmol/L)  Date Value  11/16/2019 142   Corrected calcium: 11 mg/dL (dehydration?) >> 9.7    Assessment: Patient is a 44 y/o F with medical history including diabetes who is admitted for severe DKA. Labs on admission with pH 7, ketonemia, Bicarb 4.9, BG 341, anion gap not calculated, WBC 48. Appears patient will be intubated. Pharmacy has been consulted to assist with electrolyte monitoring and replacement.  Patient has transitioned off insulin infusion.  Goal of Therapy:  Electrolytes WNL  Plan:  PHOS NAK 2 packets per tube for one dose. Electrolytes with morning labs.  Laureen Ochs, PharmD Clinical Pharmacist 11/16/2019 8:43 AM

## 2019-11-16 NOTE — Plan of Care (Signed)
Pt extubated today to room, dangled at bedside for lunch, eating and drinking well. VSS, foley DC at 1645 and pt yet to void. Husband was updated at bedside. Pt level of care is stepdown now.

## 2019-11-16 NOTE — Consult Note (Signed)
PHARMACY CONSULT NOTE  Pharmacy Consult for Electrolyte Monitoring and Replacement   Recent Labs: Potassium (mmol/L)  Date Value  11/16/2019 3.6   Magnesium (mg/dL)  Date Value  21/97/5883 2.7 (H)   Calcium (mg/dL)  Date Value  25/49/8264 9.7   Albumin (g/dL)  Date Value  15/83/0940 3.7   Phosphorus (mg/dL)  Date Value  76/80/8811 2.4 (L)   Sodium (mmol/L)  Date Value  11/16/2019 142   Corrected calcium: 11 mg/dL (dehydration?) >> 9.7    Assessment: Patient is a 44 y/o F with medical history including diabetes who is admitted for severe DKA. Labs on admission with pH 7, ketonemia, Bicarb 4.9, BG 341, anion gap not calculated, WBC 48. Appears patient will be intubated. Pharmacy has been consulted to assist with electrolyte monitoring and replacement.  Patient is on an insulin infusion. Patient is receiving fluid resuscitation, ordered 3L bolus, on MIVF with LR at 125 mL/hr. Received 1 amp of sodium bicarb.   Goal of Therapy:  K > 4 while on insulin infusion, all other electrolytes WNL  Plan:  08/14 @ 0030 K 3.6 patient currently receiving KCI 10 mEq IV x 4, will f/u w/ am labs and replace as needed.  Thomasene Ripple, PharmD, BCPS Clinical Pharmacist 11/16/2019 3:18 AM

## 2019-11-17 LAB — CBC WITH DIFFERENTIAL/PLATELET
Abs Immature Granulocytes: 0.32 10*3/uL — ABNORMAL HIGH (ref 0.00–0.07)
Basophils Absolute: 0 10*3/uL (ref 0.0–0.1)
Basophils Relative: 0 %
Eosinophils Absolute: 0 10*3/uL (ref 0.0–0.5)
Eosinophils Relative: 0 %
HCT: 29.5 % — ABNORMAL LOW (ref 36.0–46.0)
Hemoglobin: 9.9 g/dL — ABNORMAL LOW (ref 12.0–15.0)
Immature Granulocytes: 1 %
Lymphocytes Relative: 4 %
Lymphs Abs: 0.8 10*3/uL (ref 0.7–4.0)
MCH: 31.7 pg (ref 26.0–34.0)
MCHC: 33.6 g/dL (ref 30.0–36.0)
MCV: 94.6 fL (ref 80.0–100.0)
Monocytes Absolute: 0.5 10*3/uL (ref 0.1–1.0)
Monocytes Relative: 2 %
Neutro Abs: 21 10*3/uL — ABNORMAL HIGH (ref 1.7–7.7)
Neutrophils Relative %: 93 %
Platelets: 408 10*3/uL — ABNORMAL HIGH (ref 150–400)
RBC: 3.12 MIL/uL — ABNORMAL LOW (ref 3.87–5.11)
RDW: 12.7 % (ref 11.5–15.5)
WBC: 22.7 10*3/uL — ABNORMAL HIGH (ref 4.0–10.5)
nRBC: 0 % (ref 0.0–0.2)

## 2019-11-17 LAB — COMPREHENSIVE METABOLIC PANEL
ALT: 21 U/L (ref 0–44)
AST: 31 U/L (ref 15–41)
Albumin: 2 g/dL — ABNORMAL LOW (ref 3.5–5.0)
Alkaline Phosphatase: 160 U/L — ABNORMAL HIGH (ref 38–126)
Anion gap: 11 (ref 5–15)
BUN: 16 mg/dL (ref 6–20)
CO2: 22 mmol/L (ref 22–32)
Calcium: 8.6 mg/dL — ABNORMAL LOW (ref 8.9–10.3)
Chloride: 106 mmol/L (ref 98–111)
Creatinine, Ser: 0.58 mg/dL (ref 0.44–1.00)
GFR calc Af Amer: >60 mL/min (ref >60)
GFR calc non Af Amer: >60 mL/min (ref >60)
Glucose, Bld: 206 mg/dL — ABNORMAL HIGH (ref 70–99)
Potassium: 3.6 mmol/L (ref 3.5–5.1)
Sodium: 139 mmol/L (ref 135–145)
Total Bilirubin: 0.5 mg/dL (ref 0.3–1.2)
Total Protein: 5.7 g/dL — ABNORMAL LOW (ref 6.5–8.1)

## 2019-11-17 LAB — GLUCOSE, CAPILLARY
Glucose-Capillary: 92 mg/dL (ref 70–99)
Glucose-Capillary: 146 mg/dL — ABNORMAL HIGH (ref 70–99)
Glucose-Capillary: 237 mg/dL — ABNORMAL HIGH (ref 70–99)
Glucose-Capillary: 238 mg/dL — ABNORMAL HIGH (ref 70–99)
Glucose-Capillary: 247 mg/dL — ABNORMAL HIGH (ref 70–99)
Glucose-Capillary: 204 mg/dL — ABNORMAL HIGH (ref 70–99)

## 2019-11-17 LAB — MAGNESIUM: Magnesium: 1.9 mg/dL (ref 1.7–2.4)

## 2019-11-17 LAB — PHOSPHORUS: Phosphorus: 3 mg/dL (ref 2.5–4.6)

## 2019-11-17 MED ORDER — LIDOCAINE 5 % EX PTCH
1.0000 | MEDICATED_PATCH | CUTANEOUS | Status: DC
  Administered 2019-11-17 – 2019-11-19 (×3): 1 via TRANSDERMAL
  Filled 2019-11-17 (×4): qty 1

## 2019-11-17 NOTE — Progress Notes (Signed)
Patient assessed.  Vitals WDL. She is breathing on RA.  Heart sounds good.  She complains of discomfort in her right buttocks region.  Receiving LR @ 163ml/hr.

## 2019-11-17 NOTE — Care Management (Signed)
   This is a no charge note  Pick up from PCCM, Dr. Sherre Scarlet  44 year old lady with past medical history of diabetes, migraine, who was admitted to ICU due to DKA which has resolved.  Patient also had respiratory failure with hypoxia, extubated already.  Patient has cellulitis of the left superior gluteal site and is s/p of I&D.  Patient is stabilized.  Hemodynamically stable.  We need to pick up this patient tomorrow morning.      Lorretta Harp, MD  [facility]   If 7PM-7AM, please contact night-coverage [email or web address] 11/17/2019, 7:51 AM

## 2019-11-17 NOTE — Progress Notes (Signed)
Pharmacy Antibiotic Note  EMERSEN MASCARI is a 44 y.o. female admitted on 11/15/2019 with sepsis.  Pharmacy has been consulted for vanc/zosyn dosing.  Plan: Continue vanc 1.25g IV q12h and will monitor renal function and adjust doses per changes in renal function  Continue Zosyn 3.375 g IV q8h extended infusion    Height: 5' 5.98" (167.6 cm) Weight: 61.7 kg (136 lb 0.4 oz) IBW/kg (Calculated) : 59.26  Temp (24hrs), Avg:98.2 F (36.8 C), Min:97.8 F (36.6 C), Max:98.8 F (37.1 C)  Recent Labs  Lab 11/15/19 0624 11/15/19 0800 11/15/19 1357 11/15/19 1609 11/15/19 1952 11/16/19 0030 11/16/19 0427 11/17/19 0920  WBC 48.2*  --   --  45.1*  --   --  32.9* 22.7*  CREATININE 1.17*  --    < > 0.53 0.45  0.49 0.44 0.42* 0.58  LATICACIDVEN  --  3.0*  --  1.8  --   --   --   --    < > = values in this interval not displayed.    Estimated Creatinine Clearance: 84.9 mL/min (by C-G formula based on SCr of 0.58 mg/dL).    Allergies  Allergen Reactions  . Lactose Intolerance (Gi)   . Latex Hives  . Morphine And Related Itching     Thank you for allowing pharmacy to be a part of this patient's care.  Laureen Ochs, PharmD Clinical Pharmacist 11/17/2019 11:34 AM

## 2019-11-17 NOTE — Consult Note (Signed)
PHARMACY CONSULT NOTE  Pharmacy Consult for Electrolyte Monitoring and Replacement   Recent Labs: Potassium (mmol/L)  Date Value  11/17/2019 3.6   Magnesium (mg/dL)  Date Value  81/01/3158 1.9   Calcium (mg/dL)  Date Value  45/85/9292 8.6 (L)   Albumin (g/dL)  Date Value  44/62/8638 2.0 (L)   Phosphorus (mg/dL)  Date Value  17/71/1657 3.0   Sodium (mmol/L)  Date Value  11/17/2019 139       Assessment: Patient is a 44 y/o F with medical history including diabetes who is admitted for severe DKA. Labs on admission with pH 7, ketonemia, Bicarb 4.9, BG 341, anion gap not calculated, WBC 48. Appears patient will be intubated. Pharmacy has been consulted to assist with electrolyte monitoring and replacement.  Patient has transitioned off insulin infusion.  Goal of Therapy:  Electrolytes WNL  Plan:  Electrolytes stable. No replacement indicated. Continue to follow along.  Laureen Ochs, PharmD Clinical Pharmacist 11/17/2019 11:33 AM

## 2019-11-17 NOTE — Progress Notes (Signed)
Called and spoke with husband updating him on patient condition progress.  He inquired about what foods should be prepared and what should be avoided when patient returns home.  I explained to him I would pass his question of to the dietician on duty to better inform him of his wife's nutritional needs for recovery.

## 2019-11-17 NOTE — Progress Notes (Signed)
Name: Brittany Bates MRN: [id number] DOB: February 18, 1976     CONSULTATION DATE: 11/15/2019  Subjective and objective: Afebrile, extubated on 11/16/2019, has been tolerating room air had no major issues last night.  Anion gap closed and she has been transitioned to intermediate acting insulin.  PAST MEDICAL HISTORY :   has a past medical history of Bronchitis, Diabetes mellitus without complication (HCC), and Migraine.  has a past surgical history that includes Abcess removal. Prior to Admission medications   Medication Sig Start Date End Date Taking? Authorizing Provider  cephALEXin (KEFLEX) 500 MG capsule Take 1 capsule (500 mg total) by mouth 4 (four) times daily for 10 days. 11/09/19 11/19/19 Yes Gertha Calkin, MD  metFORMIN (GLUCOPHAGE) 500 MG tablet Take 1 tablet (500 mg total) 2 (two) times daily with a meal by mouth. 02/12/17  Yes Bridget Hartshorn L, PA-C  sitaGLIPtin (JANUVIA) 100 MG tablet Take 0.5 tablets (50 mg total) by mouth daily. 11/09/19 12/09/19 Yes Gertha Calkin, MD  cetirizine (ZYRTEC) 10 MG tablet Take 1 tablet (10 mg total) by mouth daily. Patient not taking: Reported on 11/06/2019 04/06/17   Cuthriell, Delorise Royals, PA-C  fluticasone (FLONASE) 50 MCG/ACT nasal spray Place 1 spray into both nostrils 2 (two) times daily. Patient not taking: Reported on 11/06/2019 04/06/17   Cuthriell, Delorise Royals, PA-C   Allergies  Allergen Reactions  . Lactose Intolerance (Gi)   . Latex Hives  . Morphine And Related Itching    FAMILY HISTORY:  family history is not on file. SOCIAL HISTORY:  reports that she has been smoking cigarettes. She has been smoking about 0.50 packs per day. She has never used smokeless tobacco. She reports that she does not drink alcohol and does not use drugs.  REVIEW OF SYSTEMS:   Unable to obtain due to critical illness      Estimated body mass index is 21.97 kg/m as calculated from the following:   Height as of this encounter: 5' 5.98" (1.676 m).   Weight as of  this encounter: 61.7 kg.    VITAL SIGNS: Temp:  [97.8 F (36.6 C)-98.8 F (37.1 C)] 98.3 F (36.8 C) (08/15 0300) Pulse Rate:  [79-111] 79 (08/15 0800) Resp:  [0-26] 0 (08/15 0800) BP: (95-153)/(61-85) 106/70 (08/15 0800) SpO2:  [97 %-100 %] 99 % (08/15 0800)   I/O last 3 completed shifts: In: 7209.5 [P.O.:240; I.V.:4853.7; IV Piggyback:2115.8] Out: 1900 [Urine:1900] Total I/O In: 137.5 [I.V.:125; IV Piggyback:12.4] Out: -    SpO2: 99 % O2 Flow Rate (L/min): 3 L/min FiO2 (%): 21 %   Physical Examination:  Awake and oriented with no focal motor deficits On room air, no respiratory distress, bilateral equal air entry and no adventitious sounds. S1 & S2 are audible with no murmur. Benign abdominal exam, normal peristalsis, resolved erythema and induration of left superior gluteal crease site of previousI&D No peripheral edema,no inguinal adenopathies   CULTURE RESULTS   Recent Results (from the past 240 hour(s))  Respiratory Panel by RT PCR (Flu A&B, Covid) - Nasopharyngeal Swab     Status: None   Collection Time: 11/15/19  6:24 AM   Specimen: Nasopharyngeal Swab  Result Value Ref Range Status   SARS Coronavirus 2 by RT PCR NEGATIVE NEGATIVE Final    Comment: (NOTE) SARS-CoV-2 target nucleic acids are NOT DETECTED.  The SARS-CoV-2 RNA is generally detectable in upper respiratoy specimens during the acute phase of infection. The lowest concentration of SARS-CoV-2 viral copies this assay can detect is  131 copies/mL. A negative result does not preclude SARS-Cov-2 infection and should not be used as the sole basis for treatment or other patient management decisions. A negative result may occur with  improper specimen collection/handling, submission of specimen other than nasopharyngeal swab, presence of viral mutation(s) within the areas targeted by this assay, and inadequate number of viral copies (<131 copies/mL). A negative result must be combined with  clinical observations, patient history, and epidemiological information. The expected result is Negative.  Fact Sheet for Patients:  https://www.moore.com/  Fact Sheet for Healthcare Providers:  https://www.young.biz/  This test is no t yet approved or cleared by the [place] FDA and  has been authorized for detection and/or diagnosis of SARS-CoV-2 by FDA under an Emergency Use Authorization (EUA). This EUA will remain  in effect (meaning this test can be used) for the duration of the COVID-19 declaration under Section 564(b)(1) of the Act, 21 U.S.C. section 360bbb-3(b)(1), unless the authorization is terminated or revoked sooner.     Influenza A by PCR NEGATIVE NEGATIVE Final   Influenza B by PCR NEGATIVE NEGATIVE Final    Comment: (NOTE) The Xpert Xpress SARS-CoV-2/FLU/RSV assay is intended as an aid in  the diagnosis of influenza from Nasopharyngeal swab specimens and  should not be used as a sole basis for treatment. Nasal washings and  aspirates are unacceptable for Xpert Xpress SARS-CoV-2/FLU/RSV  testing.  Fact Sheet for Patients: https://www.moore.com/  Fact Sheet for Healthcare Providers: https://www.young.biz/  This test is not yet approved or cleared by the [place] FDA and  has been authorized for detection and/or diagnosis of SARS-CoV-2 by  FDA under an Emergency Use Authorization (EUA). This EUA will remain  in effect (meaning this test can be used) for the duration of the  Covid-19 declaration under Section 564(b)(1) of the Act, 21  U.S.C. section 360bbb-3(b)(1), unless the authorization is  terminated or revoked. Performed at [facility], [address] [place], [place] [address]   Blood culture (routine x 2)     Status: None (Preliminary result)   Collection Time: 11/15/19  8:00 AM   Specimen: BLOOD  Result Value Ref Range Status   Specimen Description  BLOOD BLOOD RIGHT HAND  Final   Special Requests   Final    BOTTLES DRAWN AEROBIC AND ANAEROBIC Blood Culture adequate volume   Culture   Final    NO GROWTH 2 DAYS Performed at [facility], [address], [place], [place] [address]    Report Status PENDING  Incomplete  Blood culture (routine x 2)     Status: None (Preliminary result)   Collection Time: 11/15/19  8:38 AM   Specimen: BLOOD  Result Value Ref Range Status   Specimen Description BLOOD BLOOD RIGHT HAND  Final   Special Requests   Final    BOTTLES DRAWN AEROBIC AND ANAEROBIC Blood Culture adequate volume   Culture   Final    NO GROWTH 2 DAYS Performed at [facility], [address] [place], [place] [address]    Report Status PENDING  Incomplete  MRSA PCR Screening     Status: None   Collection Time: 11/15/19 10:58 PM   Specimen: Nasopharyngeal  Result Value Ref Range Status   MRSA by PCR NEGATIVE NEGATIVE Final    Comment:        The GeneXpert MRSA Assay (FDA approved for NASAL specimens only), is one component of a comprehensive MRSA colonization surveillance program. It is not intended to diagnose  MRSA infection nor to guide or monitor treatment for MRSA infections. Performed at [facility], [address] [place], [place] [address]          Assessment and plan:  DKA.(resolved) off insulin drip and has been transitioned to intermediate acting insulin. -Optimize glycemic control -Monitor anion gap, pH, lactic acid and beta hydroxybutyrate.  Acute respiratory failure with respiratory acidosis, increased work of breathing and tachypnea.  (resolved) extubated on 11/16/2019 and has been tolerating room air.  Altered mental status with toxic metabolic encephalopathy and nonfocal neurological exam CT head negative for acute intracranial abnormalities.  (Improved) following command and no focal motor deficits  Cellulitis of the left superior gluteal site of I&D with  concern for suppuration in the collection is possible deep tissue infection.  Improved with resolution of the erythema. -CT abd & pel with contrast: 1. Subtle areas of relative decreased enhancement/attenuation of the right kidney, more evident on the delayed sequence. Consider pyelonephritis in the proper clinical setting. No evidence of a renal or perirenal abscess. No obstructive uropathy. 2. Non dependent air within the bladder with no bladder wall thickening or mass. This may be from recent instrumentation. 3. Interval worsening of subcutaneous buttock and perineal inflammation, incompletely imaged on current exam, remaining peripheral/inferior to the anus and rectum and levator sling, spanning approximately 8.7 x 5.1 cm. No defined abscess. 4. Abnormal appearance of the uterus with a small uterus and small hypoattenuating lesions. 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 Suspect both of these abnormalities to be developmental. 5. Small nonobstructing stones in the right kidney. 6. Well-positioned nasogastric tube mild dependent lung base atelectasis. 7. Aortic atherosclerosis. -d/c Vanco and c/w Zosyn.MRSA PCR -ve. -Follow with surgical consult   AKI with ATN. (Improved) -Optimize hydration, avoid nephrotoxins, monitor renal panel and urine output  Elevated alkaline phosphatase with sepsis.  No gallstones, gallbladder wall thickening or biliary dilation on CT abdomen.  Elevated troponin likely demand versus supply mismatch. No ST elevation on EKG. -Monitor cardiac enzymes and echocardiogram  Severe sepsis with lactic acidosis and leukocytosis (improved). Lactic acid trended down to normal. Blood culture 11/15/2019 no growth. -Empiric antimicrobials with Zosyn and vancomycin -Monitor blood culture  Hemoconcentration with intravascular volume depletion in setting ofDKA and sepsis -Optimize hydration and monitor CBC  Reactive  thrombocytopenia  DVT. Continue with supportive care  Plan to transfer the patient to MedSurg.  Case was discussed with Dr.NIU.  Critical care time 35 minutes

## 2019-11-18 ENCOUNTER — Inpatient Hospital Stay
Admit: 2019-11-18 | Discharge: 2019-11-18 | Disposition: A | Discharge status: Home / Self Care | Payer: BLUE CROSS/BLUE SHIELD | Attending: Internal Medicine | Admitting: Internal Medicine

## 2019-11-18 DIAGNOSIS — L0231 Cutaneous abscess of buttock: Secondary | ICD-10-CM

## 2019-11-18 DIAGNOSIS — N179 Acute kidney failure, unspecified: Secondary | ICD-10-CM | POA: Diagnosis present

## 2019-11-18 LAB — ECHOCARDIOGRAM COMPLETE
AR max vel: 2.44 cm2
AV Area VTI: 2.48 cm2
AV Area mean vel: 2.48 cm2
AV Mean grad: 3 mmHg
AV Peak grad: 6 mmHg
Ao pk vel: 1.22 m/s
Area-P 1/2: 4.8 cm2
Height: 65.984 in
S' Lateral: 2.31 cm
Weight: 2405.66 oz

## 2019-11-18 LAB — TRIGLYCERIDES: Triglycerides: 241 mg/dL — ABNORMAL HIGH (ref <150)

## 2019-11-18 LAB — BASIC METABOLIC PANEL
Anion gap: 11 (ref 5–15)
BUN: 16 mg/dL (ref 6–20)
CO2: 23 mmol/L (ref 22–32)
Calcium: 8.2 mg/dL — ABNORMAL LOW (ref 8.9–10.3)
Chloride: 104 mmol/L (ref 98–111)
Creatinine, Ser: 0.52 mg/dL (ref 0.44–1.00)
GFR calc Af Amer: >60 mL/min (ref >60)
GFR calc non Af Amer: >60 mL/min (ref >60)
Glucose, Bld: 251 mg/dL — ABNORMAL HIGH (ref 70–99)
Potassium: 3.4 mmol/L — ABNORMAL LOW (ref 3.5–5.1)
Sodium: 138 mmol/L (ref 135–145)

## 2019-11-18 LAB — GLUCOSE, CAPILLARY
Glucose-Capillary: 166 mg/dL — ABNORMAL HIGH (ref 70–99)
Glucose-Capillary: 270 mg/dL — ABNORMAL HIGH (ref 70–99)
Glucose-Capillary: 152 mg/dL — ABNORMAL HIGH (ref 70–99)
Glucose-Capillary: 260 mg/dL — ABNORMAL HIGH (ref 70–99)
Glucose-Capillary: 172 mg/dL — ABNORMAL HIGH (ref 70–99)

## 2019-11-18 MED ORDER — INSULIN ASPART 100 UNIT/ML ~~LOC~~ SOLN
0.0000 [IU] | Freq: Three times a day (TID) | SUBCUTANEOUS | Status: DC
  Administered 2019-11-18: 5 [IU] via SUBCUTANEOUS
  Administered 2019-11-18: 2 [IU] via SUBCUTANEOUS
  Administered 2019-11-18: 5 [IU] via SUBCUTANEOUS
  Administered 2019-11-19: 2 [IU] via SUBCUTANEOUS
  Administered 2019-11-19: 1 [IU] via SUBCUTANEOUS
  Administered 2019-11-20: 2 [IU] via SUBCUTANEOUS
  Filled 2019-11-18 (×6): qty 1

## 2019-11-18 MED ORDER — POTASSIUM CHLORIDE 20 MEQ PO PACK
40.0000 meq | PACK | Freq: Once | ORAL | Status: AC
  Administered 2019-11-18: 40 meq via ORAL
  Filled 2019-11-18: qty 2

## 2019-11-18 MED ORDER — INSULIN ASPART 100 UNIT/ML ~~LOC~~ SOLN
0.0000 [IU] | Freq: Every day | SUBCUTANEOUS | Status: DC
  Administered 2019-11-18: 2 [IU] via SUBCUTANEOUS
  Filled 2019-11-18: qty 1

## 2019-11-18 MED ORDER — INSULIN GLARGINE 100 UNIT/ML ~~LOC~~ SOLN
12.0000 [IU] | Freq: Every day | SUBCUTANEOUS | Status: DC
  Administered 2019-11-19 – 2019-11-20 (×2): 12 [IU] via SUBCUTANEOUS
  Filled 2019-11-18 (×3): qty 0.12

## 2019-11-18 MED ORDER — INSULIN GLARGINE 100 UNIT/ML ~~LOC~~ SOLN
16.0000 [IU] | Freq: Every day | SUBCUTANEOUS | Status: DC
  Administered 2019-11-18: 16 [IU] via SUBCUTANEOUS
  Filled 2019-11-18: qty 0.16

## 2019-11-18 MED ORDER — DOXYCYCLINE HYCLATE 100 MG PO TABS
100.0000 mg | ORAL_TABLET | Freq: Two times a day (BID) | ORAL | Status: DC
  Administered 2019-11-18 – 2019-11-20 (×5): 100 mg via ORAL
  Filled 2019-11-18 (×5): qty 1

## 2019-11-18 MED ORDER — INSULIN STARTER KIT- PEN NEEDLES (ENGLISH)
1.0000 | Freq: Once | Status: AC
  Administered 2019-11-18: 1
  Filled 2019-11-18: qty 1

## 2019-11-18 NOTE — Progress Notes (Signed)
*  PRELIMINARY RESULTS* Echocardiogram 2D Echocardiogram has been performed.  Brittany Bates 11/18/2019, 11:16 AM

## 2019-11-18 NOTE — Progress Notes (Signed)
PROGRESS NOTE    Brittany Bates  LSL:[id number] DOB: 1976-02-10 DOA: 11/15/2019 PCP: Patient, No Pcp Per   Follow-up with DKA.  Brief Narrative: 44 year old lady with past medical history of diabetes, migraine, who was admitted to ICU due to DKA which has resolved.  Patient also had respiratory failure with hypoxia, extubated already.  Patient has cellulitis of the left superior gluteal site and is s/p of I&D.  Patient is stabilized.  Hemodynamically stable.  We need to pick up this patient tomorrow morning.  8/16.  Patient does not have a primary care physician, has never used insulin.  She had a recurrent abscess.  Initial abscess in the buttock on CT scan will be evaluated by general surgery.  Continue current antibiotics.   Assessment & Plan:   Active Problems:   DKA (diabetic ketoacidoses) ([place])  #1.  Uncontrolled type 2 diabetes with DKA. Anion gap has closed 2 days ago.  Started lower dose Lantus and a sliding scale insulin, patient has a small body weight, nave to insulin.  Will follow closely to avoid hypoglycemia.  Also spoke with Education officer, museum, patient currently does not have a primary care physician, she needs a close follow-up for diabetic control.  2.  Left the buttock abscess. CT scan has a pretty large area of inflammation involving left of the buttock, perianal area.  Will obtain consult from general surgery.  Continue antibiotics of vancomycin and Zosyn for today.  May consider change to oral antibiotics before discharge from hospital.  3.  Acute kidney injury secondary to ATN. Renal function had improved.  4.  Elevated troponin secondary to demand ischemia. Obtain echocardiogram.    DVT prophylaxis: Heparin Code Status: Full Family Communication:None Disposition Plan:  . Patient came from: Home           . Anticipated d/c place: Home . Barriers to d/c OR conditions which need to be met to effect a safe d/c:   Consultants:   General  surgery  Procedures: None Antimicrobials: Zosyn and vancomycin  Subjective: Patient doing well today, no fever or chills.  Denies any short of breath.  Good appetite without nausea vomiting.  Objective: Vitals:   11/17/19 2005 11/17/19 2359 11/18/19 0357 11/18/19 0500  BP: 123/68 127/72 125/76   Pulse: 86 91 89   Resp: _0 Temp: 98.4 F (36.9 C) 98.4 F (36.9 C) 97.7 F (36.5 C)   TempSrc: Oral Oral Oral   SpO2: 99% 98% 94%   Weight:    68.2 kg  Height:        Intake/Output Summary (Last 24 hours) at 11/18/2019 0948 Last data filed at 11/18/2019 0610 Gross per 24 hour  Intake 1684.37 ml  Output 900 ml  Net 784.37 ml   Filed Weights   11/15/19 2300 11/16/19 0500 11/18/19 0500  Weight: 60.2 kg 61.7 kg 68.2 kg    Examination:  General exam: Appears calm and comfortable  Respiratory system: Clear to auscultation. Respiratory effort normal. Cardiovascular system: S1 & S2 heard, RRR. No JVD, murmurs, rubs, gallops or clicks. No pedal edema. Gastrointestinal system: Abdomen is nondistended, soft and nontender. No organomegaly or masses felt. Normal bowel sounds heard. Central nervous system: Alert and oriented. No focal neurological deficits. Extremities: Symmetric  Skin: No rashes, lesions or ulcers Psychiatry: Judgement and insight appear normal. Mood & affect appropriate.  Left buttock and perianal area significant induration.    Data Reviewed: I have personally reviewed following labs and imaging studies  CBC: Recent Labs  Lab 11/15/19 0624 11/15/19 1609 11/16/19 0427 11/17/19 0920  WBC 48.2* 45.1* 32.9* 22.7*  NEUTROABS 39.9*  --   --  21.0*  HGB 15.3* 12.2 10.8* 9.9*  HCT 47.5* 35.5* 31.4* 29.5*  MCV 102.2* 95.4 94.3 94.6  PLT 784* 513* 435* 158*   Basic Metabolic Panel: Recent Labs  Lab 11/15/19 1952 11/16/19 0030 11/16/19 0427 11/16/19 0846 11/17/19 0920 11/18/19 0521  NA 141  141 142 142  --  139 138  K 2.8*  2.8* 3.6 3.9  --  3.6  3.4*  CL 114*  114* 114* 111  --  106 104  CO2 20*  20* 21* 21*  --  22 23  GLUCOSE 245*  246* 146* 148*  --  206* 251*  BUN _0 --  16 16  CREATININE 0.45  0.49 0.44 0.42*  --  0.58 0.52  CALCIUM 9.7  9.8 9.7 9.1  --  8.6* 8.2*  MG 1.6* 2.7* 2.3 2.3 1.9  --   PHOS 1.1* 2.4* 2.3* 2.6 3.0  --    GFR: Estimated Creatinine Clearance: 84.9 mL/min (by C-G formula based on SCr of 0.52 mg/dL). Liver Function Tests: Recent Labs  Lab 11/15/19 0624 11/17/19 0920  AST 13* 31  ALT 13 21  ALKPHOS 173* 160*  BILITOT 1.8* 0.5  PROT 9.1* 5.7*  ALBUMIN 3.7 2.0*   No results for input(s): LIPASE, AMYLASE in the last 168 hours. No results for input(s): AMMONIA in the last 168 hours. Coagulation Profile: No results for input(s): INR, PROTIME in the last 168 hours. Cardiac Enzymes: No results for input(s): CKTOTAL, CKMB, CKMBINDEX, TROPONINI in the last 168 hours. BNP (last 3 results) No results for input(s): PROBNP in the last 8760 hours. HbA1C: Recent Labs    11/15/19 1609  HGBA1C 12.8*   CBG: Recent Labs  Lab 11/17/19 1128 11/17/19 1604 11/17/19 1813 11/17/19 2033 11/18/19 0744  GLUCAP 237* 238* 247* 204* 270*   Lipid Profile: Recent Labs    11/15/19 1952  TRIG 73   Thyroid Function Tests: No results for input(s): TSH, T4TOTAL, FREET4, T3FREE, THYROIDAB in the last 72 hours. Anemia Panel: No results for input(s): VITAMINB12, FOLATE, FERRITIN, TIBC, IRON, RETICCTPCT in the last 72 hours. Sepsis Labs: Recent Labs  Lab 11/15/19 0800 11/15/19 1609  LATICACIDVEN 3.0* 1.8    Recent Results (from the past 240 hour(s))  Respiratory Panel by RT PCR (Flu A&B, Covid) - Nasopharyngeal Swab     Status: None   Collection Time: 11/15/19  6:24 AM   Specimen: Nasopharyngeal Swab  Result Value Ref Range Status   SARS Coronavirus 2 by RT PCR NEGATIVE NEGATIVE Final    Comment: (NOTE) SARS-CoV-2 target nucleic acids are NOT DETECTED.  The SARS-CoV-2 RNA is  generally detectable in upper respiratoy specimens during the acute phase of infection. The lowest concentration of SARS-CoV-2 viral copies this assay can detect is 131 copies/mL. A negative result does not preclude SARS-Cov-2 infection and should not be used as the sole basis for treatment or other patient management decisions. A negative result may occur with  improper specimen collection/handling, submission of specimen other than nasopharyngeal swab, presence of viral mutation(s) within the areas targeted by this assay, and inadequate number of viral copies (<131 copies/mL). A negative result must be combined with clinical observations, patient history, and epidemiological information. The expected result is Negative.  Fact Sheet for Patients:  PinkCheek.be  Fact Sheet for Healthcare  Providers:  GravelBags.it  This test is no t yet approved or cleared by the [place] and  has been authorized for detection and/or diagnosis of SARS-CoV-2 by FDA under an Emergency Use Authorization (EUA). This EUA will remain  in effect (meaning this test can be used) for the duration of the COVID-19 declaration under Section 564(b)(1) of the Act, 21 U.S.C. section 360bbb-3(b)(1), unless the authorization is terminated or revoked sooner.     Influenza A by PCR NEGATIVE NEGATIVE Final   Influenza B by PCR NEGATIVE NEGATIVE Final    Comment: (NOTE) The Xpert Xpress SARS-CoV-2/FLU/RSV assay is intended as an aid in  the diagnosis of influenza from Nasopharyngeal swab specimens and  should not be used as a sole basis for treatment. Nasal washings and  aspirates are unacceptable for Xpert Xpress SARS-CoV-2/FLU/RSV  testing.  Fact Sheet for Patients: PinkCheek.be  Fact Sheet for Healthcare Providers: GravelBags.it  This test is not yet approved or cleared by the [place] FDA and  has been authorized for detection and/or diagnosis of SARS-CoV-2 by  FDA under an Emergency Use Authorization (EUA). This EUA will remain  in effect (meaning this test can be used) for the duration of the  Covid-19 declaration under Section 564(b)(1) of the Act, 21  U.S.C. section 360bbb-3(b)(1), unless the authorization is  terminated or revoked. Performed at [facility], [address] [place], [place] [address]   Blood culture (routine x 2)     Status: None (Preliminary result)   Collection Time: 11/15/19  8:00 AM   Specimen: BLOOD  Result Value Ref Range Status   Specimen Description BLOOD BLOOD RIGHT HAND  Final   Special Requests   Final    BOTTLES DRAWN AEROBIC AND ANAEROBIC Blood Culture adequate volume   Culture   Final    NO GROWTH 3 DAYS Performed at [facility], [address] [place], [place] [address]    Report Status PENDING  Incomplete  Blood culture (routine x 2)     Status: None (Preliminary result)   Collection Time: 11/15/19  8:38 AM   Specimen: BLOOD  Result Value Ref Range Status   Specimen Description BLOOD BLOOD RIGHT HAND  Final   Special Requests   Final    BOTTLES DRAWN AEROBIC AND ANAEROBIC Blood Culture adequate volume   Culture   Final    NO GROWTH 3 DAYS Performed at [facility], [address], [place], [place] [address]    Report Status PENDING  Incomplete  MRSA PCR Screening     Status: None   Collection Time: 11/15/19 10:58 PM   Specimen: Nasopharyngeal  Result Value Ref Range Status   MRSA by PCR NEGATIVE NEGATIVE Final    Comment:        The GeneXpert MRSA Assay (FDA approved for NASAL specimens only), is one component of a comprehensive MRSA colonization surveillance program. It is not intended to diagnose MRSA infection nor to guide or monitor treatment for MRSA infections. Performed at [facility], [address], [place], [place] [address]          Radiology  Studies: No results found.      Scheduled Meds: . chlorhexidine gluconate (MEDLINE KIT)  15 mL Mouth Rinse BID  . Chlorhexidine Gluconate Cloth  6 each Topical Q0600  . heparin  5,000 Units Subcutaneous Q8H  . insulin aspart  0-5 Units Subcutaneous QHS  . insulin aspart  0-9 Units Subcutaneous TID WC  .  insulin glargine  12 Units Subcutaneous Daily  . lidocaine  1 patch Transdermal Q24H  . sodium chloride flush  10-40 mL Intracatheter Q12H   Continuous Infusions: . lactated ringers 125 mL/hr at 11/18/19 0819  . piperacillin-tazobactam (ZOSYN)  IV 12.5 mL/hr at 11/18/19 0610     LOS: 3 days    Time spent: 35 minutes    Sharen Hones, MD [facility]   To contact the attending provider between 7A-7P or the covering provider during after hours 7P-7A, please log into the web site [email or web address] and access using universal [place] password for that web site. If you do not have the password, please call the hospital operator.  11/18/2019, 9:48 AM

## 2019-11-18 NOTE — Consult Note (Signed)
PHARMACY CONSULT NOTE  Pharmacy Consult for Electrolyte Monitoring and Replacement   Recent Labs: Potassium (mmol/L)  Date Value  11/18/2019 3.4 (L)   Magnesium (mg/dL)  Date Value  34/35/6861 1.9   Calcium (mg/dL)  Date Value  68/37/2902 8.2 (L)   Albumin (g/dL)  Date Value  02/17/5207 2.0 (L)   Phosphorus (mg/dL)  Date Value  05/27/3610 3.0   Sodium (mmol/L)  Date Value  11/18/2019 138    Assessment: Patient is a 44 y/o F with medical history including diabetes who is admitted for severe DKA. Labs on admission with pH 7, ketonemia, Bicarb 4.9, BG 341, anion gap not calculated, WBC 48. Patient was intubated 8/13-8/14. Pt is now eating & drinking. DKA is resolved. Pharmacy has been consulted to assist with electrolyte monitoring and replacement.  Goal of Therapy:  Electrolytes WNL  Plan:  - MD replaced K with KCl 40 mEq PO x 1 dose - Follow-up levels with AM labs   Reatha Armour, PharmD Pharmacy Resident  11/18/2019 8:42 AM

## 2019-11-18 NOTE — Clinical Social Work Note (Signed)
Patient does not have a PCP. Preference for a female provider in [place]. Set up with Orson Eva, NP at [facility] on Monday 8/23 at 10:30 am. Patient is aware and agreeable. Added appointment information to AVS.  Charlynn Court, CSW [phone]

## 2019-11-18 NOTE — Consult Note (Addendum)
[facility] SURGICAL CONSULTATION NOTE (initial) - cpt: 04888   HISTORY OF PRESENT ILLNESS (HPI):  44 y.o. female known to our service following an admission early this month for DKA as well. She also had left gluteal abscess which was I&D'd by the EDP on 08/02. We followed her through her previous admission and she never required additional intervention. She was discharged home on 08/07 with Keflex x 10 days.   She ppresented to [facility] ED initially on 08/13 for evaluation of SOB. At the time pf presentation, patient reported a 48 hour history of progressively worsening shortness of breath and associated chest tightness.Labratory work up at that time showed significant leukocytosis to 48.2K, hemoconcentration with Hgb 15.3, hyperglycemia to 341, sCr - 1.17, acidotic with pH 7.00, Beta-Hydroxybutyric Acid >8, and lactic acidosis to 3.0. While in the ED, patient became encephalopathic and was unstable to undergo and imaging and ultimately needed to be intubated for airway protection. After intubation, she underwent imaging with CXR, CT Head, and CT Abdomen/Pelvis which were overall reassuring aside from induration to left gluteal crease in the area of previous I&D without obvious fluid collection. She was ultimately admitted to the ICU for DKA. She was started on Vancomycin/Zosyn (vancomycin has been stopped) as well. Extubated on 08/14 and transferred to floor. Most recent labs show gradually resolving leukocytosis to 22.7K and she has remained afebrile. Renal function has returned to baseline with sCr - 8.2. Mild hypokalemia to 3.4. She remains hyperglycemic at 251. Currently her pain in her left gluteal crease is stable, worse at night after laying on it.   Surgery is consulted by Hospitalist physician Dr. Marrion Coy, MD in this context for evaluation and management of gluteal abscess/cellulitis.  PAST MEDICAL HISTORY (PMH):  Past Medical History:  Diagnosis Date   Bronchitis     Diabetes mellitus without complication (HCC)    Migraine      PAST SURGICAL HISTORY (PSH):  Past Surgical History:  Procedure Laterality Date   Abcess removal       MEDICATIONS:  Prior to Admission medications   Medication Sig Start Date End Date Taking? Authorizing Provider  cephALEXin (KEFLEX) 500 MG capsule Take 1 capsule (500 mg total) by mouth 4 (four) times daily for 10 days. 11/09/19 11/19/19 Yes Gertha Calkin, MD  metFORMIN (GLUCOPHAGE) 500 MG tablet Take 1 tablet (500 mg total) 2 (two) times daily with a meal by mouth. 02/12/17  Yes Bridget Hartshorn L, PA-C  sitaGLIPtin (JANUVIA) 100 MG tablet Take 0.5 tablets (50 mg total) by mouth daily. 11/09/19 12/09/19 Yes Gertha Calkin, MD  cetirizine (ZYRTEC) 10 MG tablet Take 1 tablet (10 mg total) by mouth daily. Patient not taking: Reported on 11/06/2019 04/06/17   Cuthriell, Delorise Royals, PA-C  fluticasone (FLONASE) 50 MCG/ACT nasal spray Place 1 spray into both nostrils 2 (two) times daily. Patient not taking: Reported on 11/06/2019 04/06/17   Cuthriell, Delorise Royals, PA-C     ALLERGIES:  Allergies  Allergen Reactions   Lactose Intolerance (Gi)    Latex Hives   Morphine And Related Itching     SOCIAL HISTORY:  Social History   Socioeconomic History   Marital status: Married    Spouse name: Not on file   Number of children: Not on file   Years of education: Not on file   Highest education level: Not on file  Occupational History   Not on file  Tobacco Use   Smoking status: Current Every Day Smoker  Packs/day: 0.50    Types: Cigarettes   Smokeless tobacco: Never Used  Substance and Sexual Activity   Alcohol use: No   Drug use: No   Sexual activity: Not on file  Other Topics Concern   Not on file  Social History Narrative   Not on file   Social Determinants of Health   Financial Resource Strain:    Difficulty of Paying Living Expenses:   Food Insecurity:    Worried About Programme researcher, broadcasting/film/videounning Out of Food in the Last Year:    Engineer, sitean Out of  Food in the Last Year:   Transportation Needs:    Freight forwarderLack of Transportation (Medical):    Lack of Transportation (Non-Medical):   Physical Activity:    Days of Exercise per Week:    Minutes of Exercise per Session:   Stress:    Feeling of Stress :   Social Connections:    Frequency of Communication with Friends and Family:    Frequency of Social Gatherings with Friends and Family:    Attends Religious Services:    Active Member of Clubs or Organizations:    Attends Engineer, structuralClub or Organization Meetings:    Marital Status:   Intimate Partner Violence:    Fear of Current or Ex-Partner:    Emotionally Abused:    Physically Abused:    Sexually Abused:      FAMILY HISTORY:  No family history on file.    REVIEW OF SYSTEMS:  Review of Systems  Constitutional: Negative for chills and fever.  Respiratory: Negative for cough and shortness of breath.   Cardiovascular: Negative for chest pain and palpitations.  Gastrointestinal: Negative for abdominal pain, constipation, diarrhea, nausea and vomiting.  Genitourinary: Negative for dysuria and urgency.  Skin:       + Peri-Rectal Pain   Neurological: Negative for weakness and headaches.  Endo/Heme/Allergies: Negative for polydipsia.  All other systems reviewed and are negative.   VITAL SIGNS:  Temp:  [97.7 F (36.5 C)-98.6 F (37 C)] 97.7 F (36.5 C) (08/16 0357) Pulse Rate:  [83-96] 89 (08/16 0357) Resp:  [0-23] 18 (08/16 0357) BP: (123-141)/(65-85) 125/76 (08/16 0357) SpO2:  [94 %-100 %] 94 % (08/16 0357) Weight:  [68.2 kg] 68.2 kg (08/16 0500)     Height: 5' 5.98" (167.6 cm) Weight: 68.2 kg BMI (Calculated): 24.28   INTAKE/OUTPUT:  08/15 0701 - 08/16 0700 In: 1957.9 [I.V.:1818.6; IV Piggyback:139.3] Out: 900 [Urine:900]  PHYSICAL EXAM:  Physical Exam Vitals and nursing note reviewed. Exam conducted with a chaperone present.  Constitutional:      General: She is not in acute distress.    Appearance: Normal appearance. She is  normal weight. She is not ill-appearing.  HENT:     Head: Normocephalic and atraumatic.     Mouth/Throat:     Mouth: Mucous membranes are moist.     Comments: Poor Dentition Eyes:     Conjunctiva/sclera: Conjunctivae normal.     Pupils: Pupils are equal, round, and reactive to light.  Cardiovascular:     Rate and Rhythm: Normal rate and regular rhythm.     Pulses: Normal pulses.     Heart sounds: No murmur heard.   Pulmonary:     Effort: Pulmonary effort is normal. No respiratory distress.  Genitourinary:    Comments: Deferred Musculoskeletal:        General: Normal range of motion.     Right lower leg: No edema.     Left lower leg: No edema.  Skin:  General: Skin is warm and dry.     Coloration: Skin is not pale.     Findings: Erythema and wound present.       Neurological:     General: No focal deficit present.     Mental Status: She is alert and oriented to person, place, and time.  Psychiatric:        Mood and Affect: Mood normal.        Behavior: Behavior normal.      Labs:  CBC Latest Ref Rng & Units 11/17/2019 11/16/2019 11/15/2019  WBC 4.0 - 10.5 K/uL 22.7(H) 32.9(H) 45.1(H)  Hemoglobin 12.0 - 15.0 g/dL 3.7(J) 10.8(L) 12.2  Hematocrit 36 - 46 % 29.5(L) 31.4(L) 35.5(L)  Platelets 150 - 400 K/uL 408(H) 435(H) 513(H)   CMP Latest Ref Rng & Units 11/18/2019 11/17/2019 11/16/2019  Glucose 70 - 99 mg/dL 696(V) 893(Y) 101(B)  BUN 6 - 20 mg/dL 16 16 13   Creatinine 0.44 - 1.00 mg/dL 5.10 2.58)  Sodium 135 - 145 mmol/L 138 139 142  Potassium 3.5 - 5.1 mmol/L 3.4(L) 3.6 3.9  Chloride 98 - 111 mmol/L 104 106 111  CO2 22 - 32 mmol/L 23 22 21(L)  Calcium 8.9 - 10.3 mg/dL 8.2(L) 8.6(L) 9.1  Total Protein 6.5 - 8.1 g/dL - 5.7(L) -  Total Bilirubin 0.3 - 1.2 mg/dL - 0.5 -  Alkaline Phos 38 - 126 U/L - 160(H) -  AST 15 - 41 U/L - 31 -  ALT 0 - 44 U/L - 21 -     Imaging studies:   CT Abdomen/Pelvis (11/15/2019) personally reviewed which again shows induration  to the left gluteal crease superiorly, no obvious fluid collection on this CT, but it does appear slightly worse compared to prior exams, and radiologist report reviewed:  IMPRESSION: 1. Subtle areas of relative decreased enhancement/attenuation of the right kidney, more evident on the delayed sequence. Consider pyelonephritis in the proper clinical setting. No evidence of a renal or perirenal abscess. No obstructive uropathy. 2. Non dependent air within the bladder with no bladder wall thickening or mass. This may be from recent instrumentation. 3. Interval worsening of subcutaneous buttock and perineal inflammation, incompletely imaged on current exam, remaining peripheral/inferior to the anus and rectum and levator sling, spanning approximately 8.7 x 5.1 cm. No defined abscess. 4. Abnormal appearance of the uterus with a small uterus and small hypoattenuating lesions. 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 Suspect both of these abnormalities to be developmental. 5. Small nonobstructing stones in the right kidney. 6. Well-positioned nasogastric tube mild dependent lung base atelectasis. 7. Aortic atherosclerosis.   Assessment/Plan: (ICD-10's: L02.31) 44 y.o. female admitted with DKA and encephalopathy requiring intubation, which is now improved, who we are seeing for left gluteal cellulitis/abscess s/p I&D on 08/02 without evidence of recurrence on examination.    - Clinical examination is reassuring and without evidence of abscess or necrotizing infection. No surgical intervention warranted  - If she clinically worsens, or develops worsening leukocytosis, then I think it would be pertinent to repeat CT Pelvis at that time.   - Continue IV ABx (Zsoyn); transition to PO for home  - Pain control prn  - Further management per primary service      - Discussed with medicine service, plan for discharge tomorrow. Abx for home as above, I would recommend completing a  total of 14 days (including IV Abx here), I can see her in my clinic in 1-2 weeks to ensure  improvement, otherwise follow up with PCP.   All of the above findings and recommendations were discussed with the patient, and all of patient's questions were answered to her expressed satisfaction.  Thank you for the opportunity to participate in this patient's care.   -- Lynden Oxford, PA-C [facility] 11/18/2019, 9:50 AM [phone] M-F: 7am - 4pm  I saw and evaluated the patient.  I agree with the above documentation, exam, and plan, which I have edited where appropriate. Duanne Guess  3:47 PM

## 2019-11-18 NOTE — Progress Notes (Addendum)
Admit DKA/ Cellulitis of the left superior gluteal site of I&D with concern for suppuration in the collection is possible deep tissue infection/ Sepsis  History: DM   Home DM Meds: Metformin 500 mg BID                             Januvia 50 mg Daily  Current Orders: Novolog Sensitive Correction Scale/ SSI (0-9 units) TID AC + HS                            Lantus 16 units Daily      Just dc'd 08/07 after admit for DKA--Was educated on how to use Insulin pens last week and was counseled extensively by the Diabetes Coordinator about the importance of good CBG control--Was d/c'd 08/07 with Rxs for Metformin and Januvia (Not insulin).    MD- Since patient admitted for DKA twice within 13 day period, likely needs Insulin for home.  Diabetes Coordinator had a conversation with pt about taking insulin at home on 11/07/2019 and pt was agreeable.  Please provide pt with the following Rxs for time of d/c home: Lantus Insulin Pen- Order # [id number] Novolog Insulin Pen- Order # [id number] Insulin Pen Needles- Order # [id number] CBG Meter Kit and Supplies- Order # [id number]   Met with pt around 12pm today.  Discussed with pt what DKA is and the treatment for DKA.  Also discussed with pt that the MD's will likely d/c her home on Insulin given she was admitted with DKA 2 times in a 2 week period.  Pt stated she lives with her husband and they both are living with her father right now.  Pt told me her grandmother takes insulin and uses the insulin pens.  She has watcher her grandmother give insulin many times.  RNs are allowing pt to give all injections ni the hospital for practice.  Educated patient on insulin pen use at home.  Reviewed all steps of insulin pen including attachment of needle, 2-unit air shot, dialing up dose, giving injection, rotation of injection sites, removing needle, disposal of sharps, storage of unused insulin, disposal of insulin etc.  Patient able to provide successful return  demonstration.  Reviewed troubleshooting with insulin pen.  Also reviewed Signs/Symptoms of Hypoglycemia with patient and how to treat Hypoglycemia at home.  Pt did well with the insulin pen demo and is requesting help finding PCP for after d/c.  I read Dr. Delories Heinz notes and Dr. Delories Heinz working with [facility] team to help pt find PCP for after d/c.    --Will follow patient during hospitalization--  Wyn Quaker RN, MSN, CDE Diabetes Coordinator Inpatient Glycemic Control Team Team Pager: [phone] (8a-5p)

## 2019-11-18 NOTE — Progress Notes (Addendum)
Inpatient Diabetes Program Recommendations  AACE/ADA: New Consensus Statement on Inpatient Glycemic Control (2015)  Target Ranges:  Prepandial:   less than 140 mg/dL      Peak postprandial:   less than 180 mg/dL (1-2 hours)      Critically ill patients:  140 - 180 mg/dL   Results for Brittany Bates, Brittany Bates (MRN [id number]) as of 11/18/2019 07:20  Ref. Range 11/16/2019 23:47 11/17/2019 03:57 11/17/2019 07:40 11/17/2019 11:28 11/17/2019 16:04 11/17/2019 18:13 11/17/2019 20:33  Glucose-Capillary Latest Ref Range: 70 - 99 mg/dL 742 (H)  4 units NOVOLOG  92 146 (H)  2 units NOVOLOG  237 (H)  6 units NOVOLOG +  5 units LEVEMIR  238 (H)  6 units NOVOLOG  247 (H) 204 (H)   Results for Brittany Bates, Brittany Bates (MRN [id number]) as of 11/18/2019 07:20  Ref. Range 11/06/2019 03:53 11/15/2019 16:09  Hemoglobin A1C Latest Ref Range: 4.8 - 5.6 % 13.5 (H)  (340 mg/dl) 43.3 (H)  (295 mg/dl)    Admit DKA/ Cellulitis of the left superior gluteal site of I&D with concern for suppuration in the collection is possible deep tissue infection/ Sepsis  History: DM   Home DM Meds: Metformin 500 mg BID       Januvia 50 mg Daily  Current Orders: Novolog Sensitive Correction Scale/ SSI (0-9 units) TID AC + HS      Lantus 16 units Daily      Just dc'd 08/07 after admit for DKA--Was educated on how to use Insulin pens last week and was counseled extensively by the Diabetes Coordinator about the importance of good CBG control--Was d/c'd 08/07 with Rxs for Metformin and Januvia (Not insulin).    MD- Since patient admitted for DKA twice within 13 day period, likely needs Insulin for home.  Diabetes Coordinator had a conversation with pt about taking insulin at home on 11/07/2019 and pt was agreeable.  Plan to re-address with pt today.  Note Lantus dose increased this AM.    --Will follow patient during hospitalization--  Ambrose Finland RN, MSN, CDE Diabetes Coordinator Inpatient Glycemic Control  Team Team Pager: [phone] (8a-5p)

## 2019-11-19 LAB — BASIC METABOLIC PANEL
Anion gap: 12 (ref 5–15)
BUN: 7 mg/dL (ref 6–20)
CO2: 29 mmol/L (ref 22–32)
Calcium: 8 mg/dL — ABNORMAL LOW (ref 8.9–10.3)
Chloride: 101 mmol/L (ref 98–111)
Creatinine, Ser: 0.34 mg/dL — ABNORMAL LOW (ref 0.44–1.00)
GFR calc Af Amer: >60 mL/min (ref >60)
GFR calc non Af Amer: >60 mL/min (ref >60)
Glucose, Bld: 171 mg/dL — ABNORMAL HIGH (ref 70–99)
Potassium: 2.7 mmol/L — CL (ref 3.5–5.1)
Sodium: 142 mmol/L (ref 135–145)

## 2019-11-19 LAB — GLUCOSE, CAPILLARY
Glucose-Capillary: 160 mg/dL — ABNORMAL HIGH (ref 70–99)
Glucose-Capillary: 147 mg/dL — ABNORMAL HIGH (ref 70–99)
Glucose-Capillary: 112 mg/dL — ABNORMAL HIGH (ref 70–99)
Glucose-Capillary: 157 mg/dL — ABNORMAL HIGH (ref 70–99)

## 2019-11-19 LAB — MAGNESIUM: Magnesium: 1.8 mg/dL (ref 1.7–2.4)

## 2019-11-19 LAB — POTASSIUM: Potassium: 2.9 mmol/L — ABNORMAL LOW (ref 3.5–5.1)

## 2019-11-19 MED ORDER — AMOXICILLIN-POT CLAVULANATE 875-125 MG PO TABS
1.0000 | ORAL_TABLET | Freq: Two times a day (BID) | ORAL | 0 refills | Status: AC
Start: 2019-11-19 — End: 2019-11-30

## 2019-11-19 MED ORDER — POTASSIUM CHLORIDE 10 MEQ/100ML IV SOLN
10.0000 meq | INTRAVENOUS | Status: AC
  Administered 2019-11-19 (×4): 10 meq via INTRAVENOUS
  Filled 2019-11-19 (×5): qty 100

## 2019-11-19 MED ORDER — DOXYCYCLINE HYCLATE 100 MG PO TABS: 100.0000 mg | ORAL_TABLET | Freq: Two times a day (BID) | ORAL | 0 refills | Status: AC

## 2019-11-19 MED ORDER — BLOOD GLUCOSE MONITOR KIT: PACK | 0 refills | Status: AC

## 2019-11-19 MED ORDER — POTASSIUM CHLORIDE CRYS ER 20 MEQ PO TBCR: 20.0000 meq | EXTENDED_RELEASE_TABLET | Freq: Every day | ORAL | 0 refills | Status: DC

## 2019-11-19 MED ORDER — INSULIN GLARGINE (2 UNIT DIAL) 300 UNIT/ML ~~LOC~~ SOPN: 12.0000 [IU] | PEN_INJECTOR | Freq: Every day | SUBCUTANEOUS | 0 refills | Status: DC

## 2019-11-19 MED ORDER — POTASSIUM CHLORIDE 10 MEQ/100ML IV SOLN
10.0000 meq | INTRAVENOUS | Status: AC
  Administered 2019-11-19 (×4): 10 meq via INTRAVENOUS
  Filled 2019-11-19 (×4): qty 100

## 2019-11-19 NOTE — Clinical Social Work Note (Signed)
Patient has orders to discharge home today. No further concerns. CSW signing off.  Zora Glendenning, CSW [phone]  

## 2019-11-19 NOTE — Discharge Summary (Addendum)
Physician Discharge Summary  Patient ID: Brittany Bates MRN: [id number] DOB/AGE: [birth year] 44 y.o.  Admit date: 11/15/2019 Discharge date: 11/19/2019  Admission Diagnoses:  Discharge Diagnoses:  Active Problems:   DKA (diabetic ketoacidoses) ([place])   Left buttock abscess   AKI (acute kidney injury) [facility])   Discharged Condition: good  Hospital Course:  44 year old lady with past medical history of diabetes, migraine, whowasadmitted to ICU due to DKA which has resolved. Patient also had respiratory failure with hypoxia, extubated already. Patient has cellulitis of the left superior gluteal siteand is s/p ofI&D.Patient is stabilized. Hemodynamically stable. We need to pick up this patient tomorrow morning.  8/16.  Patient does not have a primary care physician, has never used insulin.  She had a recurrent abscess.  Initial abscess in the buttock on CT scan will be evaluated by general surgery.  Continue current antibiotics.  8/17.  Seen by general surgery, there is some induration in the buttock, no abscess has formed.  Recommended antibiotics and follow-up with Dr. Celine Ahr in 1 to 2 weeks in office.   #1.  Uncontrolled type 2 diabetes with DKA. Anion gap has closed 3 days ago.  Started lower dose Lantus and a sliding scale insulin, patient has a small body weight, nave to insulin.    She is put on 12 units of Lantus and resumed her home medicines. 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  He need to follow-up closely for her diabetes.  2.  Left the buttock abscess. CT scan has a pretty large area of inflammation involving left of the buttock, perianal area.    Treated with vancomycin and Zosyn for the last few days.  Evaluated by general surgery, no abscess to drain at this time.  Recommended oral antibiotics, will continue with doxycycline and Augmentin, follow-up with general surgery in 1 to 2 weeks.  3.  Acute kidney injury  secondary to ATN. Renal function had improved.  4.  Elevated troponin secondary to demand ischemia. Echocardiogram showed ejection fraction more than 70%, no regional abnormality.  5.  Hypokalemia. Patient will be given 40 mEq IV potassium, will recheck again at 2 PM, supplement given, I have spoken with pharmacy to dose that.  I also started 20 mEq of oral potassium daily for 5 days.  Addendum: 9/8.  6.  Severe sepsis ruled in.  Present at admission. Patient had a significant tachycardia, tachypnea, elevated lactic acid level consistent with severe sepsis.    Consults: general surgery  Significant Diagnostic Studies:  CT ABDOMEN AND PELVIS WITH CONTRAST  TECHNIQUE: Multidetector CT imaging of the abdomen and pelvis was performed using the standard protocol following bolus administration of intravenous contrast.  CONTRAST:  161m OMNIPAQUE IOHEXOL 300 MG/ML  SOLN  COMPARISON:  11/06/2019  FINDINGS: Lower chest: Minor dependent lower lobe atelectasis. No acute findings at the lung bases.  Hepatobiliary: No focal liver abnormality is seen. No gallstones, gallbladder wall thickening, or biliary dilatation.  Pancreas: Unremarkable. No pancreatic ductal dilatation or surrounding inflammatory changes.  Spleen: Normal in size without focal abnormality.  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 Normal right adrenal gland.  Linear focus of soft tissue attenuation lies just below the left adrenal gland which may reflect minimal residual left renal tissue. No normal left kidney seen. Right kidney is relatively large consistent with compensatory hypertrophy. Multiple small right intrarenal collecting system calcifications. No right renal masses.  There are subtle areas of relative decreased  enhancement/attenuation in the right kidney, more apparent on the delayed sequence. Mild prominence of the right intrarenal collecting system and  right ureter. No ureteral stone. No perinephric stranding, fluid or abscess.  There is non dependent air within the bladder. No bladder wall thickening, mass or stone.  Stomach/Bowel: Nasogastric tube tip lies in distal stomach. Stomach otherwise unremarkable.  Small bowel and colon are normal in caliber. No wall thickening. No inflammation. No evidence of appendicitis.  Vascular/Lymphatic: Aortic atherosclerosis. No aneurysm. No enlarged lymph nodes.  Reproductive: Small abnormal appearing uterus with multiple low-attenuation areas with peripheral enhancement, unchanged from the prior CT. No adnexal masses.  Other: Ill-defined and incompletely imaged inflammatory lesion of the subcutaneous fat centered on the left medial buttock, extending across the midline, posterior to the rectum and levator sling. This measures approximately 8.7 x 5.1 cm in greatest transverse dimension, and is clearly progressed when compared to the prior CT. No defined abscess, but the process is not completely covered on this study.  No abdominal wall hernia.  No ascites.  Musculoskeletal: No fracture or bone lesion. No evidence of osteomyelitis. No significant skeletal abnormality.  IMPRESSION: 1. Subtle areas of relative decreased enhancement/attenuation of the right kidney, more evident on the delayed sequence. Consider pyelonephritis in the proper clinical setting. No evidence of a renal or perirenal abscess. No obstructive uropathy. 2. Non dependent air within the bladder with no bladder wall thickening or mass. This may be from recent instrumentation. 3. Interval worsening of subcutaneous buttock and perineal inflammation, incompletely imaged on current exam, remaining peripheral/inferior to the anus and rectum and levator sling, spanning approximately 8.7 x 5.1 cm. No defined abscess. 4. Abnormal appearance of the uterus with a small uterus and small hypoattenuating lesions. There  is also an absent versus severely atrophic left kidney. Suspect both of these abnormalities to be developmental. 5. Small nonobstructing stones in the right kidney. 6. Well-positioned nasogastric tube mild dependent lung base atelectasis. 7. Aortic atherosclerosis.   Electronically Signed   By: Lajean Manes M.D.   On: 11/15/2019 12:24  Echo: 1. Left ventricular ejection fraction, by estimation, is 65 to 70%. The left ventricle has normal function. The left ventricle has no regional wall motion abnormalities. Left ventricular diastolic parameters are consistent with Grade II diastolic dysfunction (pseudonormalization). 2. Right ventricular systolic function is normal. The right ventricular size is normal. 3. The mitral valve is normal in structure. No evidence of mitral valve regurgitation. 4. The aortic valve is normal in structure. Aortic valve regurgitation is not visualized.  Treatments: Insulin drip, antibiotics with Zosyn and vancomycin.  Discharge Exam: Blood pressure 133/74, pulse 86, temperature 98.2 F (36.8 C), temperature source Oral, resp. rate 20, height 5' 5.98" (1.676 m), weight 68.2 kg, last menstrual period 09/02/2017, SpO2 97 %. General appearance: alert and cooperative Resp: clear to auscultation bilaterally Cardio: regular rate and rhythm, S1, S2 normal, no murmur, click, rub or gallop GI: soft, non-tender; bowel sounds normal; no masses,  no organomegaly Extremities: extremities normal, atraumatic, no cyanosis or edema  Disposition: Discharge disposition: 01-Home or Self Care       Discharge Instructions    Diet - low sodium heart healthy   Complete by: As directed    Discharge wound care:   Complete by: As directed    Followed by Dr. Celine Ahr to decide on I&D if needed.   Increase activity slowly   Complete by: As directed      Allergies as of 11/19/2019  Reactions   Lactose Intolerance (gi)    Latex Hives   Morphine And Related Itching       Medication List    STOP taking these medications   cephALEXin 500 MG capsule Commonly known as: KEFLEX     TAKE these medications   amoxicillin-clavulanate 875-125 MG tablet Commonly known as: Augmentin Take 1 tablet by mouth 2 (two) times daily for 11 days.   blood glucose meter kit and supplies Kit Dispense based on patient and insurance preference. Use up to four times daily as directed. (FOR ICD-9 250.00, 250.01).   cetirizine 10 MG tablet Commonly known as: ZYRTEC Take 1 tablet (10 mg total) by mouth daily.   doxycycline 100 MG tablet Commonly known as: VIBRA-TABS Take 1 tablet (100 mg total) by mouth every 12 (twelve) hours for 11 days.   fluticasone 50 MCG/ACT nasal spray Commonly known as: Flonase Place 1 spray into both nostrils 2 (two) times daily.   insulin glargine (2 Unit Dial) 300 UNIT/ML Solostar Pen Commonly known as: TOUJEO MAX Inject 12 Units into the skin daily.   metFORMIN 500 MG tablet Commonly known as: GLUCOPHAGE Take 1 tablet (500 mg total) 2 (two) times daily with a meal by mouth.   potassium chloride SA 20 MEQ tablet Commonly known as: Klor-Con M20 Take 1 tablet (20 mEq total) by mouth daily for 5 days.   sitaGLIPtin 100 MG tablet Commonly known as: Januvia Take 0.5 tablets (50 mg total) by mouth daily.            Discharge Care Instructions  (From admission, onward)         Start     Ordered   11/19/19 0000  Discharge wound care:       Comments: Followed by Dr. Celine Ahr to decide on I&D if needed.   11/19/19 5361          Follow-up Information    Danelle Berry, NP. Go on 11/25/2019.   Specialty: Nurse Practitioner Why: Appointment at 10:30 am. Please arrive at 10:00. Bring your photo ID, insurance card, and medications. Contact information: [place] [address] [phone]        Fredirick Maudlin, MD Follow up in 1 week(s).   Specialty: General Surgery Contact information: [place] [address] [phone]              45 minutes Signed: Sharen Hones 11/19/2019, 9:29 AM

## 2019-11-19 NOTE — Progress Notes (Signed)
Mobility Specialist - Progress Note   11/19/19 1515  Mobility  Activity Ambulated in hall  Level of Assistance Moderate assist, patient does 50-74%  Distance Ambulated (ft) 60 ft  Mobility Response Tolerated well  Mobility performed by Mobility specialist  $Mobility charge 1 Mobility    Pre-mobility: 91 HR, 131/65 BP, 98% SpO2 During mobility: 100 HR, 97% SpO2 Post-mobility: 92 HR, 132/67 BP, 100% SpO2   Pt was lying in bed upon arrival and agreed to session. Pt was modA in both sit-to-stand and while ambulating. After ambulating 40', pt c/o SOB. O2 desat to 97%. Pt c/o pain in both legs "8/10", but stated she feels "better after that walk". Overall, pt tolerated session well. Pt was left in bed with phone/call bell in reach. Nurse was notified.    Filiberto Pinks Mobility Specialist 11/19/19, 3:24 PM

## 2019-11-20 LAB — BASIC METABOLIC PANEL
Anion gap: 12 (ref 5–15)
BUN: <5 mg/dL — ABNORMAL LOW (ref 6–20)
CO2: 27 mmol/L (ref 22–32)
Calcium: 8.4 mg/dL — ABNORMAL LOW (ref 8.9–10.3)
Chloride: 99 mmol/L (ref 98–111)
Creatinine, Ser: 0.36 mg/dL — ABNORMAL LOW (ref 0.44–1.00)
GFR calc Af Amer: >60 mL/min (ref >60)
GFR calc non Af Amer: >60 mL/min (ref >60)
Glucose, Bld: 164 mg/dL — ABNORMAL HIGH (ref 70–99)
Potassium: 3.9 mmol/L (ref 3.5–5.1)
Sodium: 138 mmol/L (ref 135–145)

## 2019-11-20 LAB — CULTURE, BLOOD (ROUTINE X 2)
Culture: NO GROWTH
Culture: NO GROWTH
Special Requests: ADEQUATE
Special Requests: ADEQUATE

## 2019-11-20 LAB — GLUCOSE, CAPILLARY: Glucose-Capillary: 171 mg/dL — ABNORMAL HIGH (ref 70–99)

## 2019-11-20 LAB — MAGNESIUM: Magnesium: 1.8 mg/dL (ref 1.7–2.4)

## 2019-11-20 LAB — POTASSIUM: Potassium: 3.3 mmol/L — ABNORMAL LOW (ref 3.5–5.1)

## 2019-11-20 MED ORDER — POTASSIUM CHLORIDE 10 MEQ/100ML IV SOLN
10.0000 meq | INTRAVENOUS | Status: AC
  Administered 2019-11-20 (×3): 10 meq via INTRAVENOUS
  Filled 2019-11-20 (×3): qty 100

## 2019-11-20 NOTE — Discharge Instructions (Signed)
Metabolic Acidosis Metabolic acidosis is a condition in which there is too much acid in the blood. It happens when certain chemicals in the body's cells are too high or too low. This condition can happen at any age, and there are many different causes. It may be a symptom of a sudden, short-term (acute) condition, or the result of a long-term (chronic) condition. Metabolic acidosis can be mild or it can be severe and life-threatening, depending on the cause. It can be reversed if the cause is identified and properly treated. This condition is a medical emergency. What are the causes? This condition may be caused by:  The body producing too much acid.  Losing too much of a chemical that balances acid levels (bicarbonate). This can result from diarrhea or from certain surgeries on the stomach and intestines.  Extra buildup of acidic chemicals (ketone bodies) in the blood due to untreated type 1 diabetes.  Extra buildup of a chemical (lactic acid) that forms when the body is low on oxygen. This can result from: ? The heart, lungs, liver, kidneys, or brain not getting enough blood, oxygen, and nutrients (shock). ? Intense physical activity. ? Certain diseases. ? Serious infections.  Exposure to a poisonous substance (toxin), such as: ? Carbon monoxide. ? Cyanide. ? Antifreeze (ethylene glycol). ? Methanol.  Certain medicines, such as acetazolamide or large doses of aspirin.  Kidney disease or kidney infection.  Too much iron in the body.  Excessive alcohol use.  Conditions that cause you to have a low level of red blood cells (anemia). What are the signs or symptoms? Symptoms of this condition vary depending on the cause and severity. Common symptoms include:  Rapid breathing.  Difficulty breathing.  Nausea or vomiting.  Headache.  Confusion.  Weakness.  Feeling restless.  Feeling drowsy and emotionless (lethargy). Mild acidosis may not cause any symptoms. Severe  acidosis can result in shock, coma, or death. How is this diagnosed? This condition may be diagnosed based on:  A physical exam.  Your medical history.  Blood tests. These may include: ? An arterial blood gas (ABG) test or a venous blood gas (VBG) test. These tests measure the acidity levels (pH balance) of your blood. ? A serum electrolytes test. This test measures the amounts of minerals in your blood.  Urine tests. How is this treated? Treatment for metabolic acidosis depends on the underlying condition and the severity of symptoms. The goal of treatment is to balance the amount of acid in the body and to treat the underlying cause. Mild metabolic acidosis may be treated with:  Fluids given through an IV.  Medicines.  Close monitoring with blood tests. Severe forms of metabolic acidosis may require:  Hospitalization and close monitoring with blood tests.  Medicines or procedures.  Bicarbonates. These may be given through an IV if your condition is very severe. Follow these instructions at home:   Take over-the-counter and prescription medicines only as told by your health care provider.  If you were prescribed an antibiotic medicine, take it as told by your health care provider. Do not stop taking the antibiotic even if you start to feel better.  Drink enough fluid to keep your urine pale yellow.  Do not drink alcohol.  Follow instructions from your health care provider about eating or drinking restrictions.  If you have diabetes, check your urine for ketones when you are ill and as told by your health care provider.  Pay attention to your symptoms for any changes.  Keep all follow-up visits as told by your health care provider. This is important. Contact a health care provider if you have:  Any new symptoms.  Side effects from medicines you are taking.  Nausea, vomiting, or diarrhea that does not get better with medicine or that gets worse.  Body aches or  lethargy that gets worse.  Dark urine or you urinate less often than you usually do.  A decreased appetite.  A fever. Get help right away if you:  Have shortness of breath, chest pain, or a fast heartbeat (palpitations).  Feel like you are losing consciousness, or you faint.  Feel drowsy or confused.  Have severe pain in your joints and limbs.  Have severe abdominal pain. These symptoms may represent a serious problem that is an emergency. Do not wait to see if the symptoms will go away. Get medical help right away. Call your local emergency services (911 in the [place] Do not drive yourself to the hospital. Summary  Metabolic acidosis is a condition in which there is too much acid in the blood. It happens because of a chemical imbalance in your cells in the body.  Metabolic acidosis can be mild or it can be severe and life-threatening, depending on the cause. Metabolic acidosis can be corrected if the cause is identified and properly treated.  If you are at risk for this condition, be aware of symptoms of metabolic acidosis.  Metabolic acidosis is a medical emergency. This information is not intended to replace advice given to you by your health care provider. Make sure you discuss any questions you have with your health care provider. Document Revised: 05/09/2017 Document Reviewed: 05/09/2017 Elsevier Patient Education  2020 [employer].

## 2019-11-20 NOTE — Consult Note (Signed)
PHARMACY CONSULT NOTE  Pharmacy Consult for Electrolyte Monitoring and Replacement   Recent Labs: Potassium (mmol/L)  Date Value  11/20/2019 3.3 (L)   Magnesium (mg/dL)  Date Value  68/11/8108 1.8   Calcium (mg/dL)  Date Value  31/59/4585 8.0 (L)   Albumin (g/dL)  Date Value  92/92/4462 2.0 (L)   Phosphorus (mg/dL)  Date Value  86/38/1771 3.0   Sodium (mmol/L)  Date Value  11/19/2019 142    Assessment: Patient is a 44 y/o F with medical history including diabetes who is admitted for severe DKA. Labs on admission with pH 7, ketonemia, Bicarb 4.9, BG 341, anion gap not calculated, WBC 48. Appears patient will be intubated. Pharmacy has been consulted to assist with electrolyte monitoring and replacement.  Patient has transitioned off insulin infusion.  Goal of Therapy:  Electrolytes WNL  Plan:  K+ 3.3.  Will give KCL IV q1h x 3 bags ( total) & f/u K+ in am  Valrie Hart, PharmD Clinical Pharmacist 11/20/2019 1:54 AM

## 2019-11-20 NOTE — Progress Notes (Signed)
Herbie Baltimore aware of pt's CVC discontinue order.  Penni Bombard Kiosha Buchan,RN-VAST

## 2019-11-20 NOTE — Plan of Care (Signed)
Patient discharged to home with self care.  Wound care to keep area clean and dry reviewed and go to followup appointment with surgery.  Patient verbalized udnerstanding.  Reviewed discharge instruction and medication changes with patient and husband who verbalized understanding. Patient given discharge packet and diabetic education (written).  Piv x2 removed and IJ removed with tips intact.  Sites clean dry and intact no bleeding.  Patient monitored x 30 min after IJ removed with no issues.

## 2019-11-26 ENCOUNTER — Ambulatory Visit: Payer: BLUE CROSS/BLUE SHIELD | Admitting: Physician Assistant

## 2019-11-26 ENCOUNTER — Inpatient Hospital Stay: Payer: BLUE CROSS/BLUE SHIELD | Admitting: General Surgery

## 2019-11-26 ENCOUNTER — Encounter: Payer: Self-pay | Admitting: Physician Assistant

## 2019-11-26 ENCOUNTER — Telehealth: Payer: Self-pay | Admitting: General Surgery

## 2019-11-26 ENCOUNTER — Other Ambulatory Visit: Payer: Self-pay

## 2019-11-26 VITALS — BP 111/76 | HR 120 | Temp 98.8°F | Resp 12 | Wt 126.4 lb

## 2019-11-26 DIAGNOSIS — L0231 Cutaneous abscess of buttock: Secondary | ICD-10-CM

## 2019-11-26 LAB — BLOOD GAS, ARTERIAL
Acid-base deficit: 24.6 mmol/L — ABNORMAL HIGH (ref 0.0–2.0)
Bicarbonate: 8.2 mmol/L — ABNORMAL LOW (ref 20.0–28.0)
FIO2: 0.4
MECHVT: 400 mL
O2 Saturation: 98.4 %
PEEP: 5 cmH2O
Patient temperature: 37
RATE: 32 resp/min
pCO2 arterial: 43 mmHg (ref 32.0–48.0)

## 2019-11-26 NOTE — Patient Instructions (Addendum)
Our surgery scheduler will contact you today to give you surgery information. Surgery is scheduled for tomorrow. Continue with your antibiotics. If you start developing high fevers please go to the ER.  Do not eat or drink after midnight tonight.

## 2019-11-26 NOTE — Progress Notes (Addendum)
[facility] NEW PATIENT  Referring provider:  No referring provider defined for this encounter.  HISTORY OF PRESENT ILLNESS (HPI):  44 y.o. female well known to me following multiple hospitalizations in the last month for DKA and concomitant gluteal abscess. She had initially presented to the ED on 08/02 and had an I&D preformed on her left gluteal abscess and packed. She ultimately require admission at that time for DKA. She was discharged on 08/07 with Keflex x10 days. She presented again to the ED on 08/13 in DKA. She had a follow up CT on that day which was reassuring aside from induration to left gluteal crease in the area of previous I&D without obvious fluid collection. She was admitted to the ICU and started on broad spectrum Abx. We followed her through the admission and she remained without any evidence of recurrence, abscess, or necrotizing infection. She was discharged home on 08/17 with doxycycline and Augmentin.   On presentation today, she reports that she still has some soreness on her buttock, primarily near the tailbone and this is mostly felt when she sits. Her initial I&D sit has reportedly healed and she now has an approximately 3 x 1 cm wound over her tailbone which is intermittently draining. She denied any fevers at home and no redness around the area. She has continued on her Abx as prescribed. Unfortunately, she also endorses that her blood glucose continues to remain consistently >300. She does report that she has been seeing her PCP and they are managing her home insulin. She denied any chills, cough, congestion, nausea, emesis, abdominal pain, CP, or SOB. No other new complaints this morning.    PAST MEDICAL HISTORY (PMH):  Past Medical History:  Diagnosis Date  . Bronchitis   . Diabetes mellitus without complication ([place])   . Migraine      PAST SURGICAL HISTORY ([place]):  Past Surgical History:  Procedure Laterality Date  . Abcess  removal       MEDICATIONS:  Prior to Admission medications   Medication Sig Start Date End Date Taking? Authorizing Provider  amoxicillin-clavulanate (AUGMENTIN) 875-125 MG tablet Take 1 tablet by mouth 2 (two) times daily for 11 days. 11/19/19 11/30/19  Sharen Hones, MD  blood glucose meter kit and supplies KIT Dispense based on patient and insurance preference. Use up to four times daily as directed. (FOR ICD-9 250.00, 250.01). 11/19/19   Sharen Hones, MD  cetirizine (ZYRTEC) 10 MG tablet Take 1 tablet (10 mg total) by mouth daily. Patient not taking: Reported on 11/06/2019 04/06/17   Cuthriell, Charline Bills, PA-C  doxycycline (VIBRA-TABS) 100 MG tablet Take 1 tablet (100 mg total) by mouth every 12 (twelve) hours for 11 days. 11/19/19 11/30/19  Sharen Hones, MD  fluticasone (FLONASE) 50 MCG/ACT nasal spray Place 1 spray into both nostrils 2 (two) times daily. Patient not taking: Reported on 11/06/2019 04/06/17   Cuthriell, Charline Bills, PA-C  insulin glargine, 2 Unit Dial, (TOUJEO MAX) 300 UNIT/ML Solostar Pen Inject 12 Units into the skin daily. 11/19/19   Sharen Hones, MD  metFORMIN (GLUCOPHAGE) 500 MG tablet Take 1 tablet (500 mg total) 2 (two) times daily with a meal by mouth. Patient not taking: Reported on 11/18/2019 02/12/17   Letitia Neri L, PA-C  sitaGLIPtin (JANUVIA) 100 MG tablet Take 0.5 tablets (50 mg total) by mouth daily. Patient not taking: Reported on 11/26/2019 11/09/19 12/09/19  Para Skeans, MD     ALLERGIES:  Allergies  Allergen Reactions  .  Lactose Intolerance (Gi)   . Latex Hives  . Morphine And Related Itching     SOCIAL HISTORY:  Social History   Socioeconomic History  . Marital status: Married    Spouse name: Not on file  . Number of children: Not on file  . Years of education: Not on file  . Highest education level: Not on file  Occupational History  . Not on file  Tobacco Use  . Smoking status: Current Every Day Smoker    Packs/day: 0.50    Types: Cigarettes  .  Smokeless tobacco: Never Used  Substance and Sexual Activity  . Alcohol use: No  . Drug use: No  . Sexual activity: Not on file  Other Topics Concern  . Not on file  Social History Narrative  . Not on file   Social Determinants of Health   Financial Resource Strain:   . Difficulty of Paying Living Expenses: Not on file  Food Insecurity:   . Worried About Charity fundraiser in the Last Year: Not on file  . Ran Out of Food in the Last Year: Not on file  Transportation Needs:   . Lack of Transportation (Medical): Not on file  . Lack of Transportation (Non-Medical): Not on file  Physical Activity:   . Days of Exercise per Week: Not on file  . Minutes of Exercise per Session: Not on file  Stress:   . Feeling of Stress : Not on file  Social Connections:   . Frequency of Communication with Friends and Family: Not on file  . Frequency of Social Gatherings with Friends and Family: Not on file  . Attends Religious Services: Not on file  . Active Member of Clubs or Organizations: Not on file  . Attends Archivist Meetings: Not on file  . Marital Status: Not on file  Intimate Partner Violence:   . Fear of Current or Ex-Partner: Not on file  . Emotionally Abused: Not on file  . Physically Abused: Not on file  . Sexually Abused: Not on file    The patient currently resides (home / rehab facility / nursing home): Home The patient normally is (ambulatory / bedbound): Ambulatory  FAMILY HISTORY:  History reviewed. No pertinent family history.  Otherwise negative/non-contributory.  REVIEW OF SYSTEMS:  Review of Systems  Constitutional: Negative for chills and fever.  HENT: Negative for congestion and sore throat.   Respiratory: Negative for cough and shortness of breath.   Cardiovascular: Negative for chest pain and palpitations.  Gastrointestinal: Negative for abdominal pain, constipation, diarrhea, nausea and vomiting.  Skin:       + sacral wound vs ulcer  All other  systems reviewed and are negative.    VITAL SIGNS:  BP 111/76   Pulse (!) 120   Temp 98.8 F (37.1 C)   Resp 12   Wt 126 lb 6.4 oz (57.3 kg)   LMP 09/02/2017 (Approximate)   SpO2 98%   BMI 20.41 kg/m    PHYSICAL EXAM:  Physical Exam Constitutional:      General: She is not in acute distress.    Appearance: Normal appearance. She is not ill-appearing.  HENT:     Head: Normocephalic and atraumatic.     Mouth/Throat:     Lips: Pink.     Mouth: Mucous membranes are moist.     Dentition: Abnormal dentition.  Eyes:     General: No scleral icterus.    Conjunctiva/sclera: Conjunctivae normal.     Pupils:  Pupils are equal, round, and reactive to light.  Cardiovascular:     Rate and Rhythm: Regular rhythm. Tachycardia present.     Pulses: Normal pulses.     Heart sounds: No murmur heard.   Pulmonary:     Effort: Pulmonary effort is normal. No respiratory distress.     Breath sounds: Normal breath sounds.  Abdominal:     General: Abdomen is flat.     Tenderness: There is no abdominal tenderness. There is no guarding or rebound.  Genitourinary:   Skin:    General: Skin is warm and dry.          Comments: Over her sacrum she initially appears to have a 3 x 1 cm superficial wound vs pressure injury with overlying fibrinous tissue. I did bedside debridement of this in attempt to get to underlying healthy tissue to aid in healing. However, the underlying tissue appeared to be primarily liquefactive necrosis and this tracked 1-2 cm towards the right. On the left, I began to express purulent drainage and further debridement revealed significant tracking of >4 cm to the right from the 7-11 o'clock positions. She had a significant pocket of purulent drainage and liquefactive necrosis in this area. Surprisingly this was relatively non-tender and there was no overlying cellulitic changes to the skin. I did not appreciate any crepitus overlaying this wound.   Neurological:     General:  No focal deficit present.     Mental Status: She is alert and oriented to person, place, and time. Mental status is at baseline.  Psychiatric:        Mood and Affect: Mood normal.        Behavior: Behavior normal.      Labs: No new pertinent labs available this visit  Imaging studies:  No new pertinent imaging studies available at this time.    Assessment/Plan:  44 y.o. female with what appears to be a quite significant sacral wound vs ulceration with underlying abscess and liquefactive necrosis present without any evidence of necrotizing infection at the present time, complicated by co-morbidities including poorly controlled DM and multiple recent admissions for DKA.   - I did attempt to preform bedside debridement (I will document formal procedure note) of what I initially suspect was a stage 1 pressure injury with overlying fibrinous tissue. However, this ultimately revealed significant underlying tracking about 1-2 cm on the right and >4 cm on the left with copious amount of purulent drainage and liquefactive necrosis. Surprisingly, there were no cellulitic changes to the skin and I did not appreciate any evidence of active necrotizing infection. She was afebrile as well. Given these findings and significant concern over the size and severity of this wound, I did ask Dr Celine Ahr and Dr Christian Mate to see her as well, and they agreed with my assessment. Initially, we planned for direct admission and formal debridement in the OR; however, patient is with [facility] and [facility] is out of network. This was discussed with the patient and her husband, and they elected to present immediately to [facility] for evaluation of this wound. All there questions and concerns were addressed and answered.   All of the above recommendations were discussed with the patient and patient's family at bedside, and all of their questions were answered to their expressed satisfaction.  Thank you for the  opportunity to participate in this patient's care.  Face-to-face time spent with the patient and care providers was 45 minutes, with more than 50%  of the time spent counseling, educating, and coordinating care of the patient.    -- Edison Simon, PA-C [facility] 11/26/2019, 11:56 AM [phone] M-F: 7am - 4pm

## 2019-11-26 NOTE — Progress Notes (Signed)
PROCEDURE NOTE  Date: 11/26/19 11:30 AM   Preforming Provider: Lynden Oxford, PA-C  Pre-Procedure Diagnosis: Likely stage 1 sacral pressure injury with overlying fibrinous tissue  Post-Procedure Diagnosis: Likely significant unstageable sacral wound with significant underlying abscess and liquefactive necrosis   Anesthesia: None  Details of Procedure:  I discussed recommendation for bedside debridement of what I initially suspect as a stage 1 pressure injury with non-viable tissue in wound bed. Patient verbalizes understanding and verbally consented to procedure. Patient was without tenderness and elected to forgo any local anesthetic. Initial debridement with 15 blade revealed liquefactive necrotic tissue throughout the majority of the wound bed. Eventually I discovered that this wound tracked approximately 2 cm to the right from about 1 - 5 o'clock positions. There was no underlying abscess or purulence expressed. The overlying skin was without any erythema. However, on the left side of this wound there was significant tracking, >4 cm, from the 7 - 11 o'clock position. There was a very large underlying pocket of purulent drainage which I was able to express. Again, there was no overlaying skin changes to this area. At this point, I had two additional MDs readily available in clinic come and assess this wound. The bedside procedure was aborted as this was likely a far more significant wound and could not be appropriately evaluated by bedside procedure. I packed the wound with 4x4 gauze and secured with tape. Patient tolerated this well without complications and all sharps were disposed of properly.   Complications: None  Plan: Please refer to office note for detailed description of plan  -- Lynden Oxford, PA-C [facility] 11/26/2019, 12:05 PM [phone] M-F: 7am - 4pm

## 2019-11-26 NOTE — Telephone Encounter (Signed)
Patient has [facility] which is [facility] only.  [facility] is out of network with patient's insurance.  Patient has been advised that if we proceed with surgery that there is a chance her insurance will deny or not pay well on her claim.  She is aware that she needs to go to a [facility] facility. Patient decided not to proceed with surgery at [facility] and will go to [facility] instead.  Patient is also informed of the urgency of her situation and understands the need for urgent care now.  She is on her way now to [facility].  She is wished well.

## 2019-11-27 ENCOUNTER — Inpatient Hospital Stay: Admit: 2019-11-27 | Payer: BLUE CROSS/BLUE SHIELD | Admitting: General Surgery

## 2019-11-27 SURGERY — INCISION AND DRAINAGE, ABSCESS
Anesthesia: Choice

## 2019-12-01 LAB — ANAEROBIC AND AEROBIC CULTURE

## 2020-02-20 DIAGNOSIS — Z0000 Encounter for general adult medical examination without abnormal findings: Secondary | ICD-10-CM | POA: Insufficient documentation

## 2020-02-20 DIAGNOSIS — E1110 Type 2 diabetes mellitus with ketoacidosis without coma: Secondary | ICD-10-CM | POA: Insufficient documentation

## 2020-02-20 DIAGNOSIS — L0591 Pilonidal cyst without abscess: Secondary | ICD-10-CM | POA: Insufficient documentation

## 2020-02-21 DIAGNOSIS — E1142 Type 2 diabetes mellitus with diabetic polyneuropathy: Secondary | ICD-10-CM | POA: Insufficient documentation

## 2020-02-21 DIAGNOSIS — F419 Anxiety disorder, unspecified: Secondary | ICD-10-CM | POA: Insufficient documentation

## 2020-03-20 DIAGNOSIS — R87810 Cervical high risk human papillomavirus (HPV) DNA test positive: Secondary | ICD-10-CM | POA: Insufficient documentation

## 2020-11-26 DIAGNOSIS — L97519 Non-pressure chronic ulcer of other part of right foot with unspecified severity: Secondary | ICD-10-CM | POA: Insufficient documentation

## 2020-12-31 ENCOUNTER — Encounter: Payer: Self-pay | Admitting: General Surgery

## 2021-04-06 DIAGNOSIS — R002 Palpitations: Secondary | ICD-10-CM | POA: Insufficient documentation

## 2021-06-24 ENCOUNTER — Encounter: Payer: Self-pay | Admitting: Intensive Care

## 2021-06-24 ENCOUNTER — Other Ambulatory Visit: Payer: Self-pay

## 2021-06-24 ENCOUNTER — Emergency Department
Admission: EM | Admit: 2021-06-24 | Discharge: 2021-06-24 | Disposition: A | Discharge status: Home / Self Care | Payer: 59 | Attending: Emergency Medicine | Admitting: Emergency Medicine

## 2021-06-24 DIAGNOSIS — E119 Type 2 diabetes mellitus without complications: Secondary | ICD-10-CM | POA: Diagnosis not present

## 2021-06-24 DIAGNOSIS — N6331 Unspecified lump in axillary tail of the right breast: Secondary | ICD-10-CM | POA: Diagnosis not present

## 2021-06-24 DIAGNOSIS — R2231 Localized swelling, mass and lump, right upper limb: Secondary | ICD-10-CM

## 2021-06-24 DIAGNOSIS — L299 Pruritus, unspecified: Secondary | ICD-10-CM | POA: Diagnosis present

## 2021-06-24 HISTORY — DX: Pure hypercholesterolemia, unspecified: E78.00

## 2021-06-24 HISTORY — DX: Essential (primary) hypertension: I10

## 2021-06-24 NOTE — ED Provider Notes (Signed)
? ?  [facility] ?Provider Note ? ? ? Event Date/Time  ? First MD Initiated Contact with Patient 06/24/21 1216   ?  (approximate) ? ? ?History  ? ?Abscess ? ? ?HPI ? ?Brittany Bates is a 46 y.o. female  who, per PCP note dated 05/31/21 has history of DM, who presents to the emergency department today because of concern for possible abscess to right axilla. Patient says that she first noticed some swelling to that area roughly 1 week ago. She says that it is itchy but denies any pain. Denies any fevers.  ?  ?Physical Exam  ? ?Triage Vital Signs: ?ED Triage Vitals [06/24/21 1136]  ?Enc Vitals Group  ?   BP 136/76  ?   Pulse Rate 98  ?   Resp 16  ?   Temp 98.8 ?F (37.1 ?C)  ?   Temp Source Oral  ?   SpO2 99 %  ?   Weight 150 lb (68 kg)  ?   Height 5\' 6"  (1.676 m)  ?   Head Circumference   ?   Peak Flow   ?   Pain Score 0  ? ?Most recent vital signs: ?Vitals:  ? 06/24/21 1136  ?BP: 136/76  ?Pulse: 98  ?Resp: 16  ?Temp: 98.8 ?F (37.1 ?C)  ?SpO2: 99%  ? ? ?General: Awake, no distress.  ?CV:  Good peripheral perfusion.  ?Resp:  Normal effort.  ?Abd:  No distention.  ?Skin:  Small roughly 1.5 cm diameter mass to right axilla. No overlying skin changes or erythema. No fluctuance. Non tender . ? ? ?ED Results / Procedures / Treatments  ? ?Labs ?(all labs ordered are listed, but only abnormal results are displayed) ?Labs Reviewed - No data to display ? ? ?EKG ? ?None ? ? ?RADIOLOGY ?None ? ?PROCEDURES: ? ?Critical Care performed: No ? ?Procedures ? ? ?MEDICATIONS ORDERED IN ED: ?Medications - No data to display ? ? ?IMPRESSION / MDM / ASSESSMENT AND PLAN / ED COURSE  ?I reviewed the triage vital signs and the nursing notes. ?             ?               ? ?Differential diagnosis includes, but is not limited to, abscess, lymph node enlargement, cyst. ? ?Patient presented to the emergency department today because of concern for mass to right axilla. Exam is not consistent with an abscess, no erythema, no  fluctuance. Do wonder if patient has a non infected cyst, vs I think less likely enlarged lymph node. At this time given lack of concern for abscess do not feel patient would benefit from I and D. Did recommend patient monitor for signs of infection and follow up with primary care physician.  ? ?FINAL CLINICAL IMPRESSION(S) / ED DIAGNOSES  ? ?Final diagnoses:  ?Mass of right axilla  ? ? ? ? ?Note:  This document was prepared using Dragon voice recognition software and may include unintentional dictation errors. ? ?  ?06/26/21, MD ?06/24/21 1313 ? ?

## 2021-06-24 NOTE — Discharge Instructions (Signed)
As we discussed please monitor the area in your armpit and follow up with your primary care. Please return for any fevers, redness or increased pain. ?

## 2021-06-24 NOTE — ED Notes (Signed)
See triage note  presents with possible abscess area under right arm states she noticed the area about 4-5 days ago ?

## 2021-06-24 NOTE — ED Triage Notes (Signed)
Patient c/o abscess under left axilla X1 week. No head or drainage ?

## 2021-08-03 ENCOUNTER — Emergency Department
Admission: EM | Admit: 2021-08-03 | Discharge: 2021-08-03 | Disposition: A | Discharge status: Home / Self Care | Payer: 59 | Attending: Emergency Medicine | Admitting: Emergency Medicine

## 2021-08-03 ENCOUNTER — Other Ambulatory Visit: Payer: Self-pay

## 2021-08-03 DIAGNOSIS — K047 Periapical abscess without sinus: Secondary | ICD-10-CM

## 2021-08-03 DIAGNOSIS — K0889 Other specified disorders of teeth and supporting structures: Secondary | ICD-10-CM | POA: Diagnosis present

## 2021-08-03 MED ORDER — AMOXICILLIN-POT CLAVULANATE 875-125 MG PO TABS: 1.0000 | ORAL_TABLET | Freq: Two times a day (BID) | ORAL | 0 refills | Status: AC

## 2021-08-03 MED ORDER — LIDOCAINE VISCOUS HCL 2 % MT SOLN
15.0000 mL | Freq: Once | OROMUCOSAL | Status: AC
  Administered 2021-08-03: 15 mL via OROMUCOSAL
  Filled 2021-08-03: qty 15

## 2021-08-03 MED ORDER — LIDOCAINE-EPINEPHRINE 2 %-1:100000 IJ SOLN
1.7000 mL | Freq: Once | INTRAMUSCULAR | Status: AC
  Administered 2021-08-03: 1.7 mL
  Filled 2021-08-03: qty 1.7

## 2021-08-03 NOTE — Discharge Instructions (Addendum)
-  Take all of antibiotics as prescribed. ?-You may take Tylenol/ibuprofen as needed for pain. ?-Follow-up with your dentist, as discussed. ?-Return to the emergency department anytime if you begin to experience any new or worsening symptoms. ?

## 2021-08-03 NOTE — ED Triage Notes (Signed)
Pt c/o left lower dental pain with some facial swelling noted, for the past 3 days ?

## 2021-08-03 NOTE — ED Provider Notes (Addendum)
? ?[facility] ?Provider Note ? ? ? Event Date/Time  ? First MD Initiated Contact with Patient 08/03/21 [phone]   ?  (approximate) ? ? ?History  ? ?Chief Complaint ?Dental Pain ? ? ?HPI ?Brittany Bates is a 46 y.o. female, history of hypertension, diabetes, migraine, presents the emergency department for evaluation of dental pain x 3 days.  Patient states that she gets frequent dental abscesses requiring antibiotics.  She states that she has lost several teeth because of them.  She says that she is feeling a new wound develop along the left lower side of her mouth.  Denies fever/chills, dysphagia, chest pain, shortness of breath, abdominal pain, ear pain, headache, vision changes, hearing changes, or rash/lesions.  She states that she has a established dental provider, however she has not seen them in a while.  She is not in significant pain at this time ? ?History Limitations: No limitations ? ?    ? ? ?Physical Exam  ?Triage Vital Signs: ?ED Triage Vitals [08/03/21 0733]  ?Enc Vitals Group  ?   BP   ?   Pulse   ?   Resp   ?   Temp   ?   Temp src   ?   SpO2   ?   Weight   ?   Height   ?   Head Circumference   ?   Peak Flow   ?   Pain Score 7  ?   Pain Loc   ?   Pain Edu?   ?   Excl. in GC?   ? ? ?Most recent vital signs: ?Vitals:  ? 08/03/21 0736  ?BP: 138/70  ?Pulse: 92  ?Resp: 19  ?Temp: 97.7 ?F (36.5 ?C)  ?SpO2: 99%  ? ? ?General: Awake, NAD.  ?Skin: Warm, dry. No rashes or lesions.  ?Eyes: PERRL. Conjunctivae normal.  ?CV: Good peripheral perfusion.  ?Resp: Normal effort.  ?Abd: Soft, non-tender. No distention.  ?Neuro: At baseline. No gross neurological deficits.  ? ?Focused Exam: Increased swelling and erythema around the gumline of the first and second molar on the bottom left side of the mouth.  Multiple missing teeth present, as well as multiple dental caries.  No active bleeding or discharge.  No obvious fluctuant mass.  No significant swelling along the jawline or neck. ? ?Physical  Exam ? ? ? ?ED Results / Procedures / Treatments  ?Labs ?(all labs ordered are listed, but only abnormal results are displayed) ?Labs Reviewed - No data to display ? ? ?EKG ?N/A ? ? ?RADIOLOGY ? ?ED Provider Interpretation: N/A ? ?No results found. ? ?PROCEDURES: ? ?Critical Care performed: N/A ? ?Marland Kitchen.Incision and Drainage ? ?Date/Time: 08/03/2021 8:28 AM ?Performed by: Varney Daily, PA ?Authorized by: Varney Daily, PA  ? ?Consent:  ?  Consent obtained:  Verbal ?  Consent given by:  Patient ?  Risks discussed:  Bleeding, incomplete drainage, infection and pain ?  Alternatives discussed:  No treatment ?Universal protocol:  ?  Patient identity confirmed:  Verbally with patient ?Location:  ?  Type:  Abscess ?  Size:  1 cm ?  Location:  Mouth ?  Mouth location:  Alveolar process ?Pre-procedure details:  ?  Skin preparation:  Antiseptic wash ?Sedation:  ?  Sedation type:  None ?Anesthesia:  ?  Anesthesia method:  Nerve block ?  Block location:  Local dental block, 2nd molar ?  Block needle gauge:  27 G ?  Block anesthetic:  Lidocaine 2% WITH epi ?  Block injection procedure:  Anatomic landmarks identified ?  Block outcome:  Anesthesia achieved ?Procedure type:  ?  Complexity:  Simple ?Procedure details:  ?  Ultrasound guidance: no   ?  Needle aspiration: no   ?  Incision types:  Stab incision ?  Incision depth:  Submucosal ?  Drainage:  Bloody ?  Drainage amount:  Scant ?  Wound treatment:  Wound left open ?  Packing materials:  None ?Post-procedure details:  ?  Procedure completion:  Tolerated well, no immediate complications ? ? ? ?MEDICATIONS ORDERED IN ED: ?Medications  ?lidocaine-EPINEPHrine (XYLOCAINE W/EPI) 2 %-1:100000 (with pres) injection 1.7 mL (has no administration in time range)  ?lidocaine (XYLOCAINE) 2 % viscous mouth solution 15 mL (15 mLs Mouth/Throat Given 08/03/21 0755)  ? ? ? ?IMPRESSION / MDM / ASSESSMENT AND PLAN / ED COURSE  ?I reviewed the triage vital signs and the nursing notes. ?              ?               ? ?Differential diagnosis includes, but is not limited to, dental caries, dental fracture, pulpitis, gingivitis, dental abscess.  ? ?ED Course ?Patient appears well, vitals within normal limits.  NAD. ? ?Assessment/Plan ?Presentation consistent with dental infection/abscess.  Applied local anesthesia of viscous lidocaine, as well as a localized injection of lidocaine 2% with epinephrine.  Attempted drainage with stab incision of 18-gauge needle with little success.  Scant drainage.  She states that drainage has often been unsuccessful with her previous abscesses.  We will cover her with a prescription for amoxicillin/clavulanate.  Advised her to follow-up with her dentist.  She expressed understanding and agreed with the plan.  Offered pain medication, however she states that she is not needed any medicine for pain.  We will plan to discharge. ? ?Provided the patient with anticipatory guidance, return precautions, and educational material. Encouraged the patient to return to the emergency department at any time if they begin to experience any new or worsening symptoms. Patient expressed understanding and agreed with the plan.  ? ?  ? ? ?FINAL CLINICAL IMPRESSION(S) / ED DIAGNOSES  ? ?Final diagnoses:  ?Dental infection  ? ? ? ?Rx / DC Orders  ? ?ED Discharge Orders   ? ?      Ordered  ?  amoxicillin-clavulanate (AUGMENTIN) 875-125 MG tablet  2 times daily       ? 08/03/21 0825  ? ?  ?  ? ?  ? ? ? ?Note:  This document was prepared using Dragon voice recognition software and may include unintentional dictation errors. ?  ?Varney Daily, [place] ?08/03/21 6144 ? ?  ?Varney Daily, [place] ?08/03/21 [phone] ? ?  ?Sharman Cheek, MD ?08/03/21 1412 ? ?

## 2021-09-06 ENCOUNTER — Ambulatory Visit: Payer: Self-pay | Admitting: Physician Assistant

## 2022-01-05 ENCOUNTER — Encounter: Payer: Self-pay | Admitting: Podiatry

## 2022-01-05 ENCOUNTER — Ambulatory Visit: Payer: 59 | Admitting: Podiatry

## 2022-01-05 DIAGNOSIS — M79675 Pain in left toe(s): Secondary | ICD-10-CM | POA: Diagnosis not present

## 2022-01-05 DIAGNOSIS — G43909 Migraine, unspecified, not intractable, without status migrainosus: Secondary | ICD-10-CM | POA: Insufficient documentation

## 2022-01-05 DIAGNOSIS — E1142 Type 2 diabetes mellitus with diabetic polyneuropathy: Secondary | ICD-10-CM | POA: Diagnosis not present

## 2022-01-05 DIAGNOSIS — M79674 Pain in right toe(s): Secondary | ICD-10-CM | POA: Diagnosis not present

## 2022-01-05 DIAGNOSIS — B351 Tinea unguium: Secondary | ICD-10-CM

## 2022-01-09 NOTE — Progress Notes (Signed)
  Subjective:  Patient ID: Brittany Bates, female    DOB: 05-31-1975,  MRN: [id number]  Chief Complaint  Patient presents with   Diabetes    NP  diabetic foot care   Nail Problem    Thick painful toenails   Peripheral Neuropathy    "Pins and needles" in feet. Sometimes they hurt, sometimes they are numb    46 y.o. female presents with the above complaint. History confirmed with patient.   Objective:  Physical Exam: warm, good capillary refill, no trophic changes or ulcerative lesions, normal DP and PT pulses, and abnormal sensory exam with loss of protective sensation. Left Foot: dystrophic yellowed discolored nail plates with subungual debris Right Foot: dystrophic yellowed discolored nail plates with subungual debris   Assessment:   1. Pain due to onychomycosis of toenails of both feet   2. Diabetic polyneuropathy associated with type 2 diabetes mellitus ([place])      Plan:  Patient was evaluated and treated and all questions answered.   Patient educated on diabetes. Discussed proper diabetic foot care and discussed risks and complications of disease. Educated patient in depth on reasons to return to the office immediately should he/she discover anything concerning or new on the feet. All questions answered. Discussed proper shoes as well.   Discussed the etiology and treatment options for the condition in detail with the patient. Educated patient on the topical and oral treatment options for mycotic nails. Recommended debridement of the nails today. Sharp and mechanical debridement performed of all painful and mycotic nails today. Nails debrided in length and thickness using a nail nipper to level of comfort. Discussed treatment options including appropriate shoe gear. Follow up as needed for painful nails.    Return in about 3 months (around 04/07/2022) for at risk diabetic foot care.

## 2022-03-04 DIAGNOSIS — Z419 Encounter for procedure for purposes other than remedying health state, unspecified: Secondary | ICD-10-CM | POA: Diagnosis not present

## 2022-04-04 DIAGNOSIS — Z419 Encounter for procedure for purposes other than remedying health state, unspecified: Secondary | ICD-10-CM | POA: Diagnosis not present

## 2022-05-05 DIAGNOSIS — Z419 Encounter for procedure for purposes other than remedying health state, unspecified: Secondary | ICD-10-CM | POA: Diagnosis not present

## 2022-05-31 ENCOUNTER — Other Ambulatory Visit: Payer: Self-pay | Admitting: Nurse Practitioner

## 2022-05-31 DIAGNOSIS — Z1231 Encounter for screening mammogram for malignant neoplasm of breast: Secondary | ICD-10-CM

## 2022-07-10 IMAGING — DX DG CHEST 1V PORT
1 series · 1 of 1 positions shown · non-contrast
Comparison: 11/15/2019

CLINICAL DATA: Hypoxia

EXAM:
PORTABLE CHEST 1 VIEW

[chest ap]
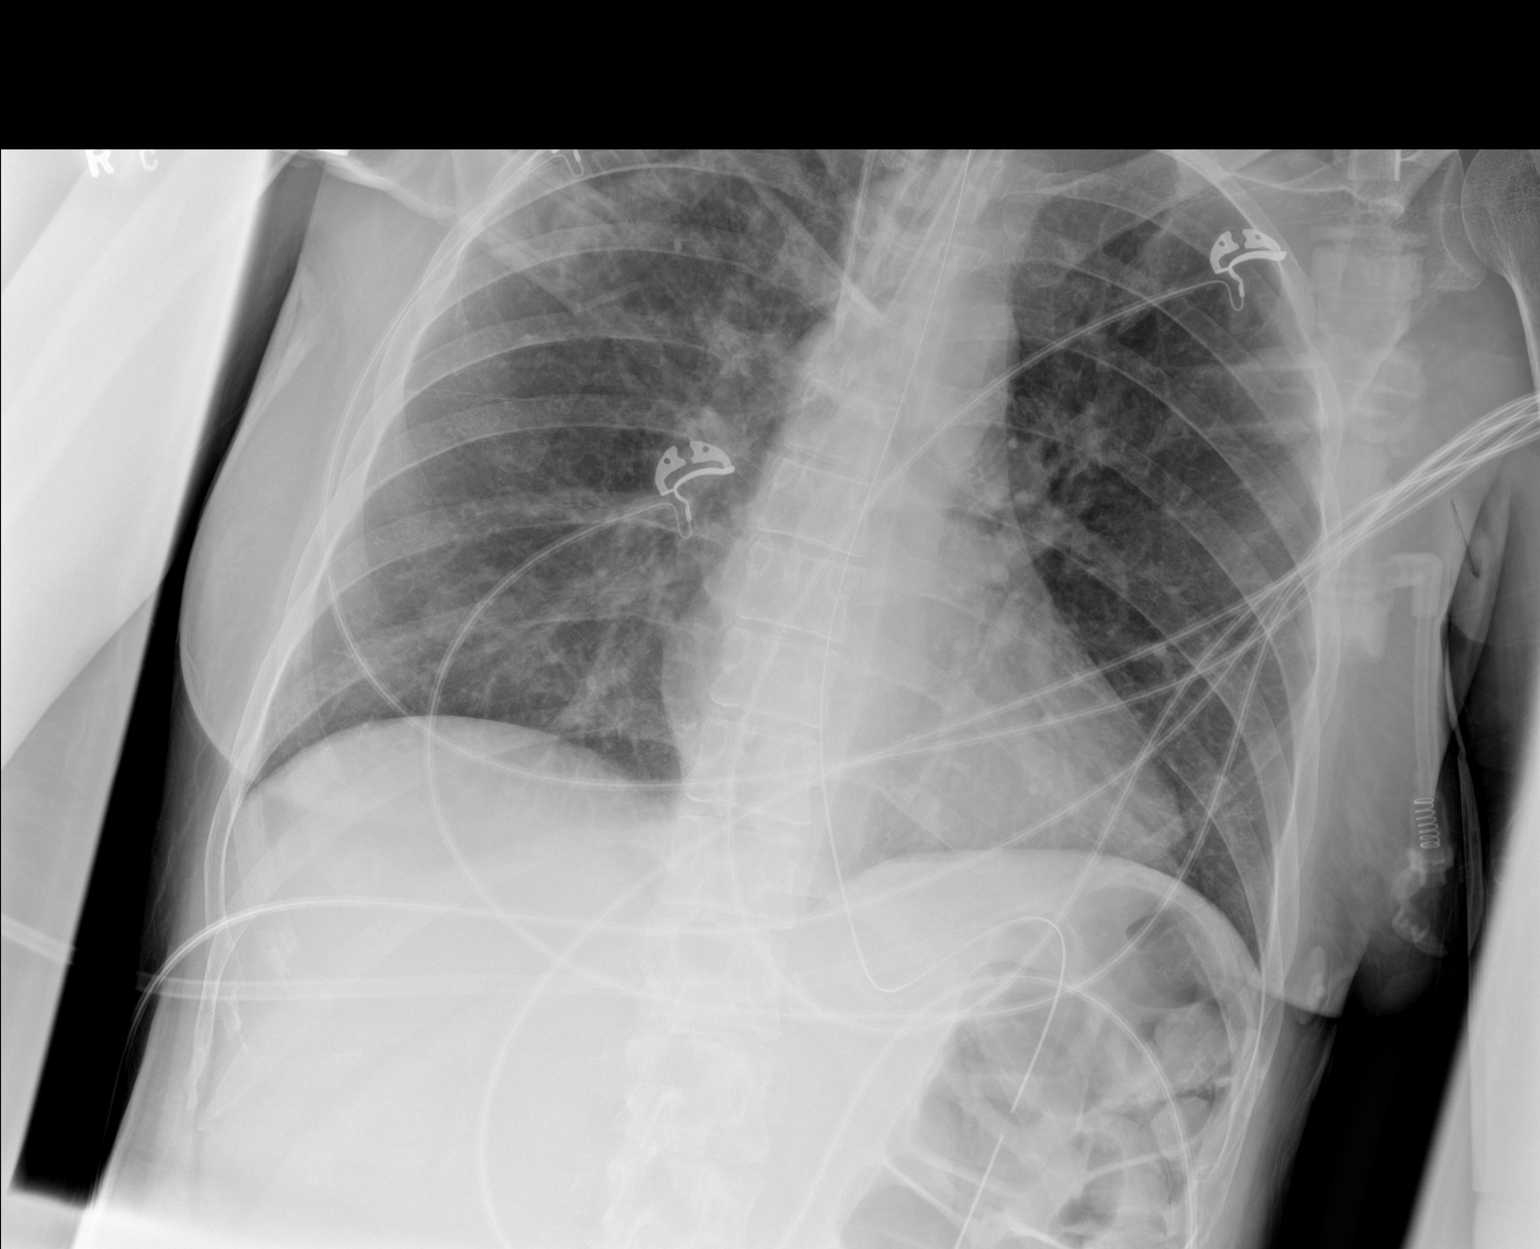

[1 of 1 positions shown; findings below may reference images not displayed]

FINDINGS: Cardiac shadow is stable. Endotracheal tube, gastric catheter and
right jugular central line are again seen and stable. Lungs are well
aerated bilaterally. No focal confluent infiltrate or sizable
effusion is noted. No bony abnormality is noted.
IMPRESSION: Tubes and lines as described above.  No acute infiltrate is seen.

## 2022-11-23 ENCOUNTER — Other Ambulatory Visit: Payer: Self-pay

## 2022-11-23 ENCOUNTER — Emergency Department
Admission: EM | Admit: 2022-11-23 | Discharge: 2022-11-23 | Disposition: A | Discharge status: Home / Self Care | Payer: 59 | Attending: Emergency Medicine | Admitting: Emergency Medicine

## 2022-11-23 DIAGNOSIS — H6502 Acute serous otitis media, left ear: Secondary | ICD-10-CM | POA: Diagnosis not present

## 2022-11-23 DIAGNOSIS — E119 Type 2 diabetes mellitus without complications: Secondary | ICD-10-CM | POA: Diagnosis not present

## 2022-11-23 DIAGNOSIS — H9202 Otalgia, left ear: Secondary | ICD-10-CM | POA: Diagnosis present

## 2022-11-23 DIAGNOSIS — Z87891 Personal history of nicotine dependence: Secondary | ICD-10-CM | POA: Diagnosis not present

## 2022-11-23 DIAGNOSIS — J069 Acute upper respiratory infection, unspecified: Secondary | ICD-10-CM | POA: Insufficient documentation

## 2022-11-23 DIAGNOSIS — H6690 Otitis media, unspecified, unspecified ear: Secondary | ICD-10-CM

## 2022-11-23 MED ORDER — AMOXICILLIN-POT CLAVULANATE 875-125 MG PO TABS: 1.0000 | ORAL_TABLET | Freq: Two times a day (BID) | ORAL | 0 refills | Status: AC

## 2022-11-23 NOTE — Discharge Instructions (Signed)
You may take the antibiotics as prescribed.  Please follow-up with your outpatient provider.  Please return for any new, worsening, or changing symptoms or other concerns.  It was a pleasure caring for you today.

## 2022-11-23 NOTE — ED Notes (Signed)
See triage notes. Patient c/o bilateral ear pain and inability to hear out of the right ear

## 2022-11-23 NOTE — ED Triage Notes (Signed)
Pt to ED via POV from home pt reports ear pain to right ear and now cannot hear out of it and now is having pain to left ear. Pt reports she has had a cold for 2 wks.

## 2022-11-23 NOTE — ED Provider Notes (Signed)
[facility] Provider Note    Event Date/Time   First MD Initiated Contact with Patient 11/23/22 1006     (approximate)   History   Otalgia   HPI  Brittany Bates is a 47 y.o. female with a past medical history of anxiety, depression, type 2 diabetes, tobacco use, migraine headaches who presents today for evaluation of ear pain.  Patient reports that she has had a URI for the past 2 weeks, and over the past few days she has felt that this has been "gone to her years."  She reports that this happens every time she gets a cold.  She reports that it started in her right ear and has now spread to her left ear.  She describes a fullness and a pain in these locations.  No fevers or chills.  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/20/2020   Health care maintenance 02/20/2020   Type 2 diabetes mellitus with ketoacidosis without coma, with long-term current use of insulin (HCC) 02/20/2020   Left buttock abscess 11/18/2019   AKI (acute kidney injury) (HCC) 11/18/2019   Tobacco abuse 11/07/2019   Cellulitis, gluteal, left 11/07/2019   DKA (diabetic ketoacidoses) 11/06/2019          Physical Exam   Triage Vital Signs: ED Triage Vitals  Encounter Vitals Group     BP 11/23/22 0906 (!) 118/93     Systolic BP Percentile --      Diastolic BP Percentile --      Pulse Rate 11/23/22 0906 89     Resp 11/23/22 0906 18     Temp 11/23/22 0906 98.1 F (36.7 C)     Temp src --      SpO2 11/23/22 0906 99 %     Weight 11/23/22 0905 174 lb (78.9 kg)     Height 11/23/22 0905 5\' 6"  (1.676 m)     Head Circumference --      Peak  Flow --      Pain Score 11/23/22 0907 5     Pain Loc --      Pain Education --      Exclude from Growth Chart --     Most recent vital signs: Vitals:   11/23/22 0906  BP: (!) 118/93  Pulse: 89  Resp: 18  Temp: 98.1 F (36.7 C)  SpO2: 99%    Physical Exam Vitals and nursing note reviewed.  Constitutional:      General: Awake and alert. No acute distress.    Appearance: Normal appearance. The patient is overweight.  HENT:     Head: Normocephalic and atraumatic.     Mouth: Mucous membranes are moist.  Right TM: Bulging and erythematous.  No perforation noted.  Normal pinna.  No proptosis of pinna.  Normal canal.  No otorrhea.  No mastoid tenderness or erythema. Left TM: Erythematous tympanic membrane, though not bulging.  Normal pinna.  No proptosis of pinna.  Mild cerumen within the canal.  No otorrhea.  No mastoid tenderness or erythema. Nasal congestion present Eyes:     General: PERRL. Normal EOMs        Right eye: No  discharge.        Left eye: No discharge.     Conjunctiva/sclera: Conjunctivae normal.  Cardiovascular:     Rate and Rhythm: Normal rate and regular rhythm.     Pulses: Normal pulses.  Pulmonary:     Effort: Pulmonary effort is normal. No respiratory distress.     Breath sounds: Normal breath sounds.  Abdominal:     Abdomen is soft. There is no abdominal tenderness. No rebound or guarding. No distention. Musculoskeletal:        General: No swelling. Normal range of motion.     Cervical back: Normal range of motion and neck supple.  Skin:    General: Skin is warm and dry.     Capillary Refill: Capillary refill takes less than 2 seconds.     Findings: No rash.  Neurological:     Mental Status: The patient is awake and alert.      ED Results / Procedures / Treatments   Labs (all labs ordered are listed, but only abnormal results are displayed) Labs Reviewed - No data to display   EKG     RADIOLOGY     PROCEDURES:  Critical Care  performed:   Procedures   MEDICATIONS ORDERED IN ED: Medications - No data to display   IMPRESSION / MDM / ASSESSMENT AND PLAN / ED COURSE  I reviewed the triage vital signs and the nursing notes.   Differential diagnosis includes, but is not limited to, otitis media, effusion, URI, nasal congestion.  Patient is awake and alert, hemodynamically stable and afebrile.  She is a bulging and erythematous right tympanic membrane and erythematous left tympanic membrane.  No evidence of otitis externa or malignant otitis externa.  I suspect otitis media in the setting of recent URI.  She was started on antibiotics.  No mastoid tenderness or erythema, no proptosis of pinna, no signs or symptoms of mastoiditis at this time.  We discussed return precautions and outpatient follow-up.  Patient or stands and agrees with plan.  She was discharged in stable condition.   Patient's presentation is most consistent with acute complicated illness / injury requiring diagnostic workup.    FINAL CLINICAL IMPRESSION(S) / ED DIAGNOSES   Final diagnoses:  Acute otitis media, unspecified otitis media type  Upper respiratory tract infection, unspecified type     Rx / DC Orders   ED Discharge Orders          Ordered    amoxicillin-clavulanate (AUGMENTIN) 875-125 MG tablet  2 times daily        11/23/22 1020             Note:  This document was prepared using Dragon voice recognition software and may include unintentional dictation errors.   Keturah Shavers 11/23/22 1029    Minna Antis, MD 11/23/22 1524

## 2023-05-01 ENCOUNTER — Other Ambulatory Visit: Payer: Self-pay | Admitting: Family Medicine

## 2023-05-01 DIAGNOSIS — Z1231 Encounter for screening mammogram for malignant neoplasm of breast: Secondary | ICD-10-CM

## 2023-06-04 LAB — COLOGUARD: COLOGUARD: NEGATIVE

## 2023-06-04 LAB — EXTERNAL GENERIC LAB PROCEDURE: COLOGUARD: NEGATIVE

## 2023-06-27 ENCOUNTER — Emergency Department

## 2023-06-27 ENCOUNTER — Emergency Department
Admission: EM | Admit: 2023-06-27 | Discharge: 2023-06-27 | Disposition: A | Discharge status: Home / Self Care | Attending: Emergency Medicine | Admitting: Emergency Medicine

## 2023-06-27 ENCOUNTER — Encounter: Payer: Self-pay | Admitting: Emergency Medicine

## 2023-06-27 ENCOUNTER — Other Ambulatory Visit: Payer: Self-pay

## 2023-06-27 DIAGNOSIS — L03317 Cellulitis of buttock: Secondary | ICD-10-CM | POA: Insufficient documentation

## 2023-06-27 DIAGNOSIS — L0231 Cutaneous abscess of buttock: Secondary | ICD-10-CM | POA: Insufficient documentation

## 2023-06-27 DIAGNOSIS — I10 Essential (primary) hypertension: Secondary | ICD-10-CM | POA: Insufficient documentation

## 2023-06-27 DIAGNOSIS — E119 Type 2 diabetes mellitus without complications: Secondary | ICD-10-CM | POA: Insufficient documentation

## 2023-06-27 LAB — CBC WITH DIFFERENTIAL/PLATELET
Abs Immature Granulocytes: 0.04 10*3/uL (ref 0.00–0.07)
Basophils Absolute: 0.1 10*3/uL (ref 0.0–0.1)
Basophils Relative: 1 %
Eosinophils Absolute: 0.1 10*3/uL (ref 0.0–0.5)
Eosinophils Relative: 1 %
HCT: 43.9 % (ref 36.0–46.0)
Hemoglobin: 15.2 g/dL — ABNORMAL HIGH (ref 12.0–15.0)
Immature Granulocytes: 0 %
Lymphocytes Relative: 21 %
Lymphs Abs: 2.2 10*3/uL (ref 0.7–4.0)
MCH: 32.5 pg (ref 26.0–34.0)
MCHC: 34.6 g/dL (ref 30.0–36.0)
MCV: 93.8 fL (ref 80.0–100.0)
Monocytes Absolute: 0.7 10*3/uL (ref 0.1–1.0)
Monocytes Relative: 7 %
Neutro Abs: 7.4 10*3/uL (ref 1.7–7.7)
Neutrophils Relative %: 70 %
Platelets: 273 10*3/uL (ref 150–400)
RBC: 4.68 MIL/uL (ref 3.87–5.11)
RDW: 11.6 % (ref 11.5–15.5)
WBC: 10.6 10*3/uL — ABNORMAL HIGH (ref 4.0–10.5)
nRBC: 0 % (ref 0.0–0.2)

## 2023-06-27 LAB — BASIC METABOLIC PANEL
Anion gap: 10 (ref 5–15)
BUN: 22 mg/dL — ABNORMAL HIGH (ref 6–20)
CO2: 18 mmol/L — ABNORMAL LOW (ref 22–32)
Calcium: 9 mg/dL (ref 8.9–10.3)
Chloride: 106 mmol/L (ref 98–111)
Creatinine, Ser: 1.14 mg/dL — ABNORMAL HIGH (ref 0.44–1.00)
GFR, Estimated: >60 mL/min (ref >60)
Glucose, Bld: 383 mg/dL — ABNORMAL HIGH (ref 70–99)
Potassium: 3.9 mmol/L (ref 3.5–5.1)
Sodium: 134 mmol/L — ABNORMAL LOW (ref 135–145)

## 2023-06-27 MED ORDER — CEPHALEXIN 500 MG PO CAPS: 500.0000 mg | ORAL_CAPSULE | Freq: Four times a day (QID) | ORAL | 0 refills | Status: AC

## 2023-06-27 MED ORDER — IOHEXOL 300 MG/ML  SOLN
100.0000 mL | Freq: Once | INTRAMUSCULAR | Status: AC | PRN
  Administered 2023-06-27: 100 mL via INTRAVENOUS

## 2023-06-27 NOTE — Discharge Instructions (Addendum)
 You were evaluated in the ED for an abscess in the gluteal region.  Your lab work is reassuring.  Your CT abdomen and pelvis revealed cellulitis in this area.  We will place you on antibiotics in which you are encouraged to complete this course even if symptoms resolve.  We also encourage you to perform sitz bath's with Epsom salt to help relieve pain.  Please continue to monitor this area for improvement.  Please follow-up with your primary care in 1 week.

## 2023-06-27 NOTE — ED Provider Notes (Signed)
 [facility] Emergency Department Provider Note     Event Date/Time   First MD Initiated Contact with Patient 06/27/23 1537     (approximate)   History   Abscess   HPI  Brittany Bates is a 48 y.o. female with a history of diabetes, HTN and HLD and tobacco use presents to the ED for evaluation of an abscess to the inner fold of her left gluteal x 2-3 weeks.  Patient reports yesterday she popped the abscess and stated concerns for infection given brown discharge.  She denies fever and chills.  She also complains of left ear fullness x 2 days. Hx of ear infections. No other symptoms accompanied.     Physical Exam   Triage Vital Signs: ED Triage Vitals [06/27/23 1436]  Encounter Vitals Group     BP (!) 158/72     Systolic BP Percentile      Diastolic BP Percentile      Pulse Rate (!) 103     Resp 17     Temp 98.6 F (37 C)     Temp Source Oral     SpO2 100 %     Weight 177 lb (80.3 kg)     Height 5\' 6"  (1.676 m)     Head Circumference      Peak Flow      Pain Score 7     Pain Loc      Pain Education      Exclude from Growth Chart     Most recent vital signs: Vitals:   06/27/23 1436  BP: (!) 158/72  Pulse: (!) 103  Resp: 17  Temp: 98.6 F (37 C)  SpO2: 100%    General Awake, no distress.  HEENT NCAT. PERRL. EOMI. Left TM is intact. No redness or bulging.  CV:  Good peripheral perfusion.  RESP:  Normal effort.  ABD:  No distention.  Other:  Approximately 1 cm cystic-like lesion to the superior anus region with erythema and induration to the left inner gluteal fold. No spontaneous drainage.    ED Results / Procedures / Treatments   Labs (all labs ordered are listed, but only abnormal results are displayed) Labs Reviewed  CBC WITH DIFFERENTIAL/PLATELET - Abnormal; Notable for the following components:      Result Value   WBC 10.6 (*)    Hemoglobin 15.2 (*)    All other components within normal limits  BASIC METABOLIC PANEL  - Abnormal; Notable for the following components:   Sodium 134 (*)    CO2 18 (*)    Glucose, Bld 383 (*)    BUN 22 (*)    Creatinine, Ser 1.14 (*)    All other components within normal limits    RADIOLOGY  I personally viewed and evaluated these images as part of my medical decision making, as well as reviewing the written report by the radiologist.  ED Provider Interpretation: cellulitis of the proximal gluteal cleft without abscess  CT ABDOMEN PELVIS W CONTRAST Result Date: 06/27/2023 CLINICAL DATA:  Buttock abscess for a few weeks. Ruptured yesterday. EXAM: CT ABDOMEN AND PELVIS WITH CONTRAST TECHNIQUE: Multidetector CT imaging of the abdomen and pelvis was performed using the standard protocol following bolus administration of intravenous contrast. RADIATION DOSE REDUCTION: This exam was performed according to the departmental dose-optimization program which includes automated exposure control, adjustment of the mA and/or kV according to patient size and/or use of iterative reconstruction technique. CONTRAST:  OMNIPAQUE IOHEXOL  300 MG/ML  SOLN COMPARISON:  CT abdomen pelvis dated November 15, 2019. FINDINGS: Lower chest: No acute abnormality. Hepatobiliary: No focal liver abnormality is seen. No gallstones, gallbladder wall thickening, or biliary dilatation. Pancreas: Unremarkable. No pancreatic ductal dilatation or surrounding inflammatory changes. Spleen: Normal in size without focal abnormality. 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 No follow-up imaging is recommended. Normal right adrenal gland. Absent left kidney. Several punctate right renal calculi again noted. No right renal mass or hydronephrosis. The bladder is unremarkable for the degree of distention. Stomach/Bowel: Stomach is within normal limits. Appendix appears normal. No evidence of bowel wall thickening, distention, or inflammatory changes. Vascular/Lymphatic: Aortic atherosclerosis. No enlarged  abdominal or pelvic lymph nodes. Reproductive: Uterus and bilateral adnexa are unremarkable. Other: No abdominal wall hernia or abnormality. No abdominopelvic ascites. No pneumoperitoneum. Musculoskeletal: Soft tissue thickening and 2.8 x 1.7 cm rounded density along the proximal gluteal cleft. No significant fluid component. IMPRESSION: 1. Soft tissue thickening and 2.8 x 1.7 cm rounded density along the proximal gluteal cleft, consistent with cellulitis/phlegmon. No discrete abscess. 2. Unchanged punctate nonobstructive right nephrolithiasis. Electronically Signed   By: Obie Dredge M.D.   On: 06/27/2023 17:57    PROCEDURES:  Critical Care performed: No  Procedures   MEDICATIONS ORDERED IN ED: Medications  iohexol (OMNIPAQUE) 300 MG/ML solution 100 mL (100 mLs Intravenous Contrast Given 06/27/23 1704)     IMPRESSION / MDM / ASSESSMENT AND PLAN / ED COURSE  I reviewed the triage vital signs and the nursing notes.                               48 y.o. female presents to the emergency department for evaluation and treatment of abscess and left ear pain. See HPI for further details.   Differential diagnosis includes, but is not limited to pilonidal abscess, cellulitis, perianal abscess, otitis media,     Patient's presentation is most consistent with acute complicated illness / injury requiring diagnostic workup.  Patient is alert and oriented.  She is initially mildly tachy at 103 bpm.  She is afebrile.  Physical exam findings are pertinent for a spontaneous draining 1 cm abscess with surrounding erythema and induration.  Presentation is clinically consistent with a pilonidal abscess that the patient has already drained.  During exam this lesion was palpated and a very small amount of blood was drained.  Concerns for abscess extending into deeper tissue given surrounding induration.  Obtain CT A/P which showed no discrete abscess and soft tissue thickening indicating cellulitis.  Patient  will be sent home on antibiotics.  She is encouraged for close follow-up with her PCP.  ED return precautions discussed.  Patient is in stable condition at discharge.   FINAL CLINICAL IMPRESSION(S) / ED DIAGNOSES   Final diagnoses:  Cellulitis and abscess of buttock   Rx / DC Orders   ED Discharge Orders          Ordered    cephALEXin (KEFLEX) 500 MG capsule  4 times daily        06/27/23 1809             Note:  This document was prepared using Dragon voice recognition software and may include unintentional dictation errors.    Romeo Apple, Koby Hartfield A, PA-C 06/27/23 1826    Claybon Jabs, MD 06/27/23 1901

## 2023-06-27 NOTE — ED Triage Notes (Signed)
 Patient to ED via POV for abscess on buttocks. States has been there for a few weeks but popped yesterday. States concerned with infection- oozing brown color. States she is also having let ear pain.

## 2024-01-23 ENCOUNTER — Ambulatory Visit: Payer: Self-pay

## 2024-01-23 NOTE — Telephone Encounter (Signed)
 FYI Only or Action Required?: FYI only for provider.  Patient was last seen in primary care on Not established.  Called Nurse Triage reporting Pain.  Symptoms began several months ago.  Interventions attempted: OTC medications: Tylenol .  Symptoms are: gradually worsening.  Triage Disposition: See HCP Within 4 Hours (Or PCP Triage)  Patient/caregiver understands and will follow disposition?: Yes  Copied from CRM #[id number]. Topic: Clinical - Red Word Triage >> Jan 23, 2024  4:00 PM Donna BRAVO wrote: Red Word that prompted transfer to Nurse Triage: patient stating she has diabetic neuropathy in both feet, very painful to walk, painful all the time, unable to sleep due to pain.    ----------------------------------------------------------------------- From previous Reason for Contact - Scheduling: Patient/patient representative is calling to schedule an appointment. Refer to attachments for appointment information.  Patient wanting to establish care  with Darice Petty FNP-C Reason for Disposition  [1] SEVERE pain (e.g., excruciating, unable to do any normal activities) AND [2] not improved after 2 hours of pain medicine  Answer Assessment - Initial Assessment Questions Has hx of neuropathy for past 3-4 years. Only able to wear sandals without socks. Walking slowly with a limp currently. Pt not established with [facility]CP, requesting so schedule a NP visit at [facility]. Soonest available appt scheduled for 04/2024 and advised to go to UC today to have increased foot pain evaluated. Pt reports she would go today.  1. ONSET: When did the pain start?      Past couple months pain has gotten worse  2. LOCATION: Where is the pain located?      Both feet  3. PAIN: How bad is the pain?    (Scale 1-10; or mild, moderate, severe)     Currently 8/10. Previously was 5/10 everyday. Taking tylenol  arthritis, not very effective.  4. WORK OR EXERCISE: Has there been any  recent work or exercise that involved this part of the body?      No.  5. CAUSE: What do you think is causing the foot pain?     Neuropathy.   6. OTHER SYMPTOMS: Do you have any other symptoms? (e.g., leg pain, rash, fever, numbness)     Toes swollen for past few month. No redness. 2nd toe on right foot with lump on it is red and painful. No fever  7. PREGNANCY: Is there any chance you are pregnant? When was your last menstrual period?     No  Protocols used: Foot Pain-A-AH

## 2024-02-29 ENCOUNTER — Emergency Department

## 2024-02-29 ENCOUNTER — Emergency Department
Admission: EM | Admit: 2024-02-29 | Discharge: 2024-02-29 | Disposition: A | Discharge status: Home / Self Care | Attending: Emergency Medicine | Admitting: Emergency Medicine

## 2024-02-29 ENCOUNTER — Other Ambulatory Visit: Payer: Self-pay

## 2024-02-29 DIAGNOSIS — Y92009 Unspecified place in unspecified non-institutional (private) residence as the place of occurrence of the external cause: Secondary | ICD-10-CM | POA: Insufficient documentation

## 2024-02-29 DIAGNOSIS — S82092A Other fracture of left patella, initial encounter for closed fracture: Secondary | ICD-10-CM | POA: Diagnosis not present

## 2024-02-29 DIAGNOSIS — Z794 Long term (current) use of insulin: Secondary | ICD-10-CM | POA: Diagnosis not present

## 2024-02-29 DIAGNOSIS — W16312A Fall into other water striking water surface causing other injury, initial encounter: Secondary | ICD-10-CM | POA: Diagnosis not present

## 2024-02-29 DIAGNOSIS — I10 Essential (primary) hypertension: Secondary | ICD-10-CM | POA: Diagnosis not present

## 2024-02-29 DIAGNOSIS — E119 Type 2 diabetes mellitus without complications: Secondary | ICD-10-CM | POA: Diagnosis not present

## 2024-02-29 DIAGNOSIS — S8992XA Unspecified injury of left lower leg, initial encounter: Secondary | ICD-10-CM | POA: Diagnosis present

## 2024-02-29 MED ORDER — HYDROCODONE-ACETAMINOPHEN 5-325 MG PO TABS: 1.0000 | ORAL_TABLET | Freq: Four times a day (QID) | ORAL | 0 refills | Status: AC | PRN

## 2024-02-29 NOTE — ED Provider Notes (Signed)
   [facility] Provider Note    Event Date/Time   First MD Initiated Contact with Patient 02/29/24 [phone]     (approximate)   History   Fall and Knee Pain   HPI  Brittany Bates is a 48 y.o. female  with history of DM 2 on insulin , HTN,  and as listed in EMR presents to the emergency department for treatment and evaluation of left knee pain after mechanical fall at home onto [facility] floor. She did not hit her head or lose consciousness. She is not on blood thinner. She denies pain other than left knee.     Physical Exam    Vitals:   02/29/24 0745  BP: (!) 150/73  Pulse: 81  Resp: 20  Temp: 97.8 F (36.6 C)  SpO2: 100%    General: Awake, no distress.  CV:  Good peripheral perfusion.  Resp:  Normal effort.  Abd:  No distention.  Other:  No focal midline tenderness over cervical spine. Contusion over prepatellar surface of left knee. Limited range of motion due to pain. Focal swelling at the distal patella.   ED Results / Procedures / Treatments   Labs (all labs ordered are listed, but only abnormal results are displayed)  Labs Reviewed - No data to display   EKG  Not indicated.   RADIOLOGY  Image and radiology report reviewed and interpreted by me. Radiology report consistent with the same.  X-ray of the left knee shows possible nondisplaced fracture of the base of the inferior patella enthesophyte with overlying soft tissue swelling  PROCEDURES:  Critical Care performed: No  Procedures   MEDICATIONS ORDERED IN ED:  Medications - No data to display   IMPRESSION / MDM / ASSESSMENT AND PLAN / ED COURSE   I have reviewed the triage note and vital signs. Vital signs stable   Differential diagnosis includes, but is not limited to, patella fracture, contusion, knee strain  Patient's presentation is most consistent with acute illness / injury with system symptoms.  47 year old female presents to the ER after mechanical,  non-syncopal fall. See HPI.  On exam, contusion and limited ROM of left knee. Imaging ordered.  X-ray shows nondisplaced fracture of the base of the patella, which is consistent with exam.  She will be placed in a knee immobilizer and given crutches.  Limited weight bearing was advised.  She will be given a short course of pain medication.  Medication teaching completed.  She is to call and schedule an appointment with orthopedics.  ER return precautions discussed.      FINAL CLINICAL IMPRESSION(S) / ED DIAGNOSES   Final diagnoses:  Other closed fracture of left patella, initial encounter     Rx / DC Orders   ED Discharge Orders          Ordered    HYDROcodone -acetaminophen  (NORCO/VICODIN) 5-325 MG tablet  Every 6 hours PRN        02/29/24 0935             Note:  This document was prepared using Dragon voice recognition software and may include unintentional dictation errors.   Herlinda Kirk NOVAK, FNP 02/29/24 9057    Claudene Rover, MD 02/29/24 [phone]

## 2024-02-29 NOTE — Discharge Instructions (Addendum)
 Please call and schedule an appointment with orthopedics.  Limit weight bearing as much as possible.  Taking pain medicine, please remember that it may make you sleepy or dizzy.  You should not drive or operate machinery for at least 8 hours after your last dose.  Also, do not take any additional Tylenol  while taking the pain medication.  You may also take ibuprofen  or Aleve  in addition to the pain medication.

## 2024-02-29 NOTE — ED Triage Notes (Signed)
 Pt to ED for eval after mechanical fall. Pt slipped on water  and fell onto L knee about 15 minutes ago.

## 2024-02-29 NOTE — ED Notes (Signed)
 See triage note  Presents s/p fall  States she slipped  Landed on left knee Unable to bear wt

## 2024-04-16 ENCOUNTER — Ambulatory Visit: Admitting: Nurse Practitioner

## 2024-04-16 ENCOUNTER — Encounter: Payer: Self-pay | Admitting: Nurse Practitioner

## 2024-04-16 VITALS — BP 128/81 | HR 90 | Temp 97.6°F | Ht 64.96 in | Wt 179.8 lb

## 2024-04-16 DIAGNOSIS — E785 Hyperlipidemia, unspecified: Secondary | ICD-10-CM | POA: Diagnosis not present

## 2024-04-16 DIAGNOSIS — F419 Anxiety disorder, unspecified: Secondary | ICD-10-CM | POA: Diagnosis not present

## 2024-04-16 DIAGNOSIS — E1110 Type 2 diabetes mellitus with ketoacidosis without coma: Secondary | ICD-10-CM | POA: Diagnosis not present

## 2024-04-16 DIAGNOSIS — F32A Depression, unspecified: Secondary | ICD-10-CM | POA: Diagnosis not present

## 2024-04-16 DIAGNOSIS — I10 Essential (primary) hypertension: Secondary | ICD-10-CM | POA: Diagnosis not present

## 2024-04-16 DIAGNOSIS — Z794 Long term (current) use of insulin: Secondary | ICD-10-CM | POA: Diagnosis not present

## 2024-04-16 DIAGNOSIS — E1140 Type 2 diabetes mellitus with diabetic neuropathy, unspecified: Secondary | ICD-10-CM | POA: Insufficient documentation

## 2024-04-16 DIAGNOSIS — Z7689 Persons encountering health services in other specified circumstances: Secondary | ICD-10-CM | POA: Diagnosis not present

## 2024-04-16 DIAGNOSIS — E1169 Type 2 diabetes mellitus with other specified complication: Secondary | ICD-10-CM | POA: Diagnosis not present

## 2024-04-16 LAB — MICROALBUMIN, URINE WAIVED
Creatinine, Urine Waived: 200 mg/dL (ref 10–300)
Microalb, Ur Waived: 150 mg/L — ABNORMAL HIGH (ref 0–19)
Microalb/Creat Ratio: >300 mg/g — ABNORMAL HIGH

## 2024-04-16 MED ORDER — PREGABALIN 25 MG PO CAPS: 25.0000 mg | ORAL_CAPSULE | Freq: Two times a day (BID) | ORAL | 1 refills | Status: AC

## 2024-04-16 MED ORDER — DEXCOM G7 SENSOR MISC: 1.0000 [IU] | 1 refills | Status: AC | PRN

## 2024-04-16 MED ORDER — ESCITALOPRAM OXALATE 10 MG PO TABS: 10.0000 mg | ORAL_TABLET | Freq: Every day | ORAL | 1 refills | Status: AC

## 2024-04-16 MED ORDER — LOSARTAN POTASSIUM 50 MG PO TABS: 50.0000 mg | ORAL_TABLET | Freq: Every day | ORAL | 1 refills | Status: AC

## 2024-04-16 MED ORDER — INSULIN ASPART 100 UNIT/ML IJ SOLN: 15.0000 [IU] | Freq: Three times a day (TID) | INTRAMUSCULAR | 1 refills | Status: AC

## 2024-04-16 MED ORDER — INSULIN GLARGINE 100 UNIT/ML ~~LOC~~ SOLN: 46.0000 [IU] | Freq: Every day | SUBCUTANEOUS | 1 refills | Status: AC

## 2024-04-16 MED ORDER — AMLODIPINE BESYLATE 10 MG PO TABS: 10.0000 mg | ORAL_TABLET | Freq: Every day | ORAL | 1 refills | Status: AC

## 2024-04-16 NOTE — Assessment & Plan Note (Addendum)
 Chronic. Not well controlled. Did not tolerate Gabapentin due to side effects.  Will start Pregabalin  25mg  BID.  Side effects and benefits of medication discussed.  Referral placed for Neurology.  Follow up in 1 month.

## 2024-04-16 NOTE — Assessment & Plan Note (Signed)
 Chronic.  Controlled.  Continue with current medication regimen of Amlodipine  10mg  and Losartan  50mg .  Labs ordered today.  Return to clinic in 6 months for reevaluation.  Call sooner if concerns arise.

## 2024-04-16 NOTE — Assessment & Plan Note (Signed)
 Chronic.  Controlled.  Continue with current medication regimen of Atorvastatin.  Labs ordered today.  Return to clinic in 6 months for reevaluation.  Call sooner if concerns arise.

## 2024-04-16 NOTE — Assessment & Plan Note (Signed)
 Chronic.  Controlled.  Continue with current medication regimen of Lexapro  10mg  daily.  Refills sent today.  Labs ordered today.  Return to clinic in 6 months for reevaluation.  Call sooner if concerns arise.

## 2024-04-16 NOTE — Assessment & Plan Note (Signed)
 Chronic.  Unsure of last A1c. No labs to review.  Patient would benefit from GLP1. Currenlty on Metformin  500mg  BID, Lantus  46u am, Novolog  15u TID.  Wearing Dexcom G6.  Eye exam requested.  Foot exam done. Follow up in 1 month.  Labs ordered. Will adjust medications as needed based on labs.

## 2024-04-16 NOTE — Progress Notes (Signed)
 "  BP 128/81 (BP Location: Right Arm, Patient Position: Sitting, Cuff Size: Normal)   Pulse 90   Temp 97.6 F (36.4 C) (Oral)   Ht 5' 4.96 (1.65 m)   Wt 179 lb 12.8 oz (81.6 kg)   LMP 09/02/2017   SpO2 97%   BMI 29.96 kg/m    Subjective:    Patient ID: Brittany Bates, female    DOB: 12/04/1975, 49 y.o.   MRN: [id number]  HPI: Brittany Bates is a 49 y.o. female  Chief Complaint  Patient presents with   Establish Care    NP. Patient stated she has a few concerns wit her health, her main thing is neuropathy in her feet she can barely walk. She's been dealing with it for 2 years. She also has hypertension, type 2 diabetic, anxiety, high cholesterol. She is also doing PT for her knee fracture.   Patient presents to clinic to establish care with new PCP.  Introduced to publishing rights manager role and practice setting.  All questions answered.  Discussed provider/patient relationship and expectations.  Patient reports a history of HTN, HLD, DM2, .  Patient was being seen at [facility].     Patient denies a history of: Hypertension, Elevated Cholesterol, Diabetes, Thyroid problems, Depression, Anxiety, Neurological problems, and Abdominal problems.    DIABETES Believes her last A1c was 9%.  Hypoglycemic episodes:no Polydipsia/polyuria: no Visual disturbance: no Chest pain: no Paresthesias: no Glucose Monitoring: yes  Accucheck frequency: Continuous  Fasting glucose: 130  Post prandial:  Evening:  Before meals: Taking Insulin ?: yes  Long acting insulin : Lantus  46 am  Short acting insulin : Novolog  15u Blood Pressure Monitoring: not checking Retinal Examination: Up to Date- [name] Foot Exam: Up to Date Diabetic Education: Not Completed Pneumovax: Up to Date Influenza: Up to Date Aspirin: no  HYPERTENSION without Chronic Kidney Disease Hypertension status: controlled  Satisfied with current treatment? yes Duration of hypertension: years BP monitoring  frequency:  not checking BP range:  BP medication side effects:  no Medication compliance: excellent compliance Previous BP meds:amlodipine  and losartan  (cozaar ) Aspirin: no Recurrent headaches: no Visual changes: no Palpitations: no Dyspnea: no Chest pain: no Lower extremity edema: no Dizzy/lightheaded: no  Patient states she has ongoing pain in the bottom of her feet.  States her toes ar numb.  Feels like she could walk on glass and no one would notice. She understands that it is likely from her diabetes.  Patient states she doesn't take any medication for her neuropathy.  She can't take Gabapentin- made her really mood and changed her personality.  She hasn't tried pregabalin .     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   Migraine 01/05/2022   Palpitation 04/06/2021   Type 2 diabetes mellitus with ketoacidosis without coma, with long-term current use of insulin  (HCC) 02/20/2020   Ulcer of right foot (HCC) 11/26/2020   Hyperlipidemia associated with type 2 diabetes mellitus (HCC) 04/16/2024   Type 2 diabetes mellitus with diabetic neuropathy, with long-term current use of insulin  (HCC) 04/16/2024   Primary hypertension 04/16/2024   Resolved Ambulatory Problems    Diagnosis Date Noted   AKI (acute kidney injury) 11/18/2019  Diabetic polyneuropathy associated with type 2 diabetes mellitus (HCC) 02/21/2020   Health care maintenance 02/20/2020   Past Medical History:  Diagnosis Date   Bronchitis    Diabetes mellitus without complication (HCC)    High cholesterol    Hypertension    Past Surgical History:  Procedure Laterality Date   Abcess removal      History reviewed. No pertinent family history.   Review of Systems  Eyes:  Negative for visual disturbance.  Respiratory:  Negative for cough, chest tightness and shortness of breath.   Cardiovascular:  Negative for chest pain, palpitations and leg swelling.  Endocrine: Negative for polydipsia and polyuria.  Neurological:  Positive for numbness. Negative for dizziness and headaches.  Psychiatric/Behavioral:  Positive for dysphoric mood. Negative for suicidal ideas. The patient is nervous/anxious.     Per HPI unless specifically indicated above     Objective:    BP 128/81 (BP Location: Right Arm, Patient Position: Sitting, Cuff Size: Normal)   Pulse 90   Temp 97.6 F (36.4 C) (Oral)   Ht 5' 4.96 (1.65 m)   Wt 179 lb 12.8 oz (81.6 kg)   LMP 09/02/2017   SpO2 97%   BMI 29.96 kg/m   Wt Readings from Last 3 Encounters:  04/16/24 179 lb 12.8 oz (81.6 kg)  02/29/24 176 lb (79.8 kg)  06/27/23 177 lb (80.3 kg)    Physical Exam Vitals and nursing note reviewed.  Constitutional:      General: She is not in acute distress.    Appearance: Normal appearance. She is normal weight. She is not ill-appearing, toxic-appearing or diaphoretic.  HENT:     Head: Normocephalic.     Right Ear: External ear normal.     Left Ear: External ear normal.     Nose: Nose normal.     Mouth/Throat:     Mouth: Mucous membranes are moist.     Pharynx: Oropharynx is clear.  Eyes:     General:        Right eye: No discharge.        Left eye: No discharge.     Extraocular Movements: Extraocular movements intact.     Conjunctiva/sclera: Conjunctivae normal.     Pupils: Pupils are equal, round, and reactive to light.  Cardiovascular:     Rate and Rhythm: Normal rate and regular rhythm.     Heart sounds: No murmur heard. Pulmonary:     Effort: Pulmonary effort is normal. No respiratory distress.     Breath sounds: Normal breath sounds. No wheezing or rales.  Musculoskeletal:     Cervical back:  Normal range of motion and neck supple.  Skin:    General: Skin is warm and dry.     Capillary Refill: Capillary refill takes less than 2 seconds.  Neurological:     General: No focal deficit present.     Mental Status: She is alert and oriented to person, place, and time. Mental status is at baseline.  Psychiatric:        Mood and Affect: Mood normal.        Behavior: Behavior normal.        Thought Content: Thought content normal.        Judgment: Judgment normal.     Results for orders placed or performed during the hospital encounter of 06/27/23  CBC with Differential   Collection Time: 06/27/23  2:38 PM  Result Value Ref Range   WBC 10.6 (H) 4.0 - 10.5 K/uL  RBC 4.68 3.87 - 5.11 MIL/uL   Hemoglobin 15.2 (H) 12.0 - 15.0 g/dL   HCT 56.0 63.9 - 53.9 %   MCV 93.8 80.0 - 100.0 fL   MCH 32.5 26.0 - 34.0 pg   MCHC 34.6 30.0 - 36.0 g/dL   RDW 88.3 88.4 - 84.4 %   Platelets 273 150 - 400 K/uL   nRBC 0.0 0.0 - 0.2 %   Neutrophils Relative % 70 %   Neutro Abs 7.4 1.7 - 7.7 K/uL   Lymphocytes Relative 21 %   Lymphs Abs 2.2 0.7 - 4.0 K/uL   Monocytes Relative 7 %   Monocytes Absolute 0.7 0.1 - 1.0 K/uL   Eosinophils Relative 1 %   Eosinophils Absolute 0.1 0.0 - 0.5 K/uL   Basophils Relative 1 %   Basophils Absolute 0.1 0.0 - 0.1 K/uL   Immature Granulocytes 0 %   Abs Immature Granulocytes 0.04 0.00 - 0.07 K/uL  Basic metabolic panel   Collection Time: 06/27/23  2:38 PM  Result Value Ref Range   Sodium 134 (L) 135 - 145 mmol/L   Potassium 3.9 3.5 - 5.1 mmol/L   Chloride 106 98 - 111 mmol/L   CO2 18 (L) 22 - 32 mmol/L   Glucose, Bld 383 (H) 70 - 99 mg/dL   BUN 22 (H) 6 - 20 mg/dL   Creatinine, Ser 8.85 (H) 0.44 - 1.00 mg/dL   Calcium 9.0 8.9 - 89.6 mg/dL   GFR, Estimated >39 >39 mL/min   Anion gap 10 5 - 15      Assessment & Plan:   Problem List Items Addressed This Visit       Cardiovascular and Mediastinum   Primary hypertension   Chronic.  Controlled.   Continue with current medication regimen of Amlodipine  10mg  and Losartan  50mg .  Labs ordered today.  Return to clinic in 6 months for reevaluation.  Call sooner if concerns arise.        Relevant Medications   losartan  (COZAAR ) 50 MG tablet   amLODipine  (NORVASC ) 10 MG tablet     Endocrine   Type 2 diabetes mellitus with ketoacidosis without coma, with long-term current use of insulin  (HCC) - Primary   Chronic.  Unsure of last A1c. No labs to review.  Patient would benefit from GLP1. Currenlty on Metformin  500mg  BID, Lantus  46u am, Novolog  15u TID.  Wearing Dexcom G6.  Eye exam requested.  Foot exam done. Follow up in 1 month.  Labs ordered. Will adjust medications as needed based on labs.       Relevant Medications   insulin  glargine (LANTUS ) 100 UNIT/ML injection   losartan  (COZAAR ) 50 MG tablet   insulin  aspart (NOVOLOG ) 100 UNIT/ML injection   Other Relevant Orders   Comp Met (CMET)   HgB A1c   Microalbumin, Urine Waived   Hyperlipidemia associated with type 2 diabetes mellitus (HCC)   Chronic.  Controlled.  Continue with current medication regimen of Atorvastatin.  Labs ordered today.  Return to clinic in 6 months for reevaluation.  Call sooner if concerns arise.        Relevant Medications   insulin  glargine (LANTUS ) 100 UNIT/ML injection   losartan  (COZAAR ) 50 MG tablet   insulin  aspart (NOVOLOG ) 100 UNIT/ML injection   amLODipine  (NORVASC ) 10 MG tablet   Other Relevant Orders   Lipid Profile   Type 2 diabetes mellitus with diabetic neuropathy, with long-term current use of insulin  (HCC)   Chronic. Not well controlled. Did not  tolerate Gabapentin due to side effects.  Will start Pregabalin  25mg  BID.  Side effects and benefits of medication discussed.  Referral placed for Neurology.  Follow up in 1 month.       Relevant Medications   insulin  glargine (LANTUS ) 100 UNIT/ML injection   losartan  (COZAAR ) 50 MG tablet   insulin  aspart (NOVOLOG ) 100 UNIT/ML injection   Other  Relevant Orders   Ambulatory referral to Neurology     Other   Anxiety and depression   Chronic.  Controlled.  Continue with current medication regimen of Lexapro  10mg  daily.  Refills sent today.  Labs ordered today.  Return to clinic in 6 months for reevaluation.  Call sooner if concerns arise.        Relevant Medications   escitalopram  (LEXAPRO ) 10 MG tablet   Other Visit Diagnoses       Encounter to establish care            Follow up plan: Return in about 1 month (around 05/17/2024) for HTN, HLD, DM2 FU.      "

## 2024-04-17 ENCOUNTER — Ambulatory Visit: Payer: Self-pay | Admitting: Nurse Practitioner

## 2024-04-17 LAB — LIPID PANEL
Chol/HDL Ratio: 3.8 ratio (ref 0.0–4.4)
Cholesterol, Total: 137 mg/dL (ref 100–199)
HDL: 36 mg/dL — ABNORMAL LOW
LDL Chol Calc (NIH): 80 mg/dL (ref 0–99)
Triglycerides: 115 mg/dL (ref 0–149)
VLDL Cholesterol Cal: 21 mg/dL (ref 5–40)

## 2024-04-17 LAB — COMPREHENSIVE METABOLIC PANEL WITH GFR
ALT: 24 IU/L (ref 0–32)
AST: 11 IU/L (ref 0–40)
Albumin: 4.3 g/dL (ref 3.9–4.9)
Alkaline Phosphatase: 114 IU/L (ref 41–116)
BUN/Creatinine Ratio: 18 (ref 9–23)
BUN: 19 mg/dL (ref 6–24)
Bilirubin Total: 0.3 mg/dL (ref 0.0–1.2)
CO2: 17 mmol/L — ABNORMAL LOW (ref 20–29)
Calcium: 9.1 mg/dL (ref 8.7–10.2)
Chloride: 109 mmol/L — ABNORMAL HIGH (ref 96–106)
Creatinine, Ser: 1.03 mg/dL — ABNORMAL HIGH (ref 0.57–1.00)
Globulin, Total: 2.3 g/dL (ref 1.5–4.5)
Glucose: 141 mg/dL — ABNORMAL HIGH (ref 70–99)
Potassium: 4.6 mmol/L (ref 3.5–5.2)
Sodium: 141 mmol/L (ref 134–144)
Total Protein: 6.6 g/dL (ref 6.0–8.5)
eGFR: 67 mL/min/1.73

## 2024-04-17 LAB — HEMOGLOBIN A1C
Est. average glucose Bld gHb Est-mCnc: 189 mg/dL
Hgb A1c MFr Bld: 8.2 % — ABNORMAL HIGH (ref 4.8–5.6)

## 2024-04-17 MED ORDER — OZEMPIC (0.25 OR 0.5 MG/DOSE) 2 MG/3ML ~~LOC~~ SOPN: 0.2500 mg | PEN_INJECTOR | SUBCUTANEOUS | 1 refills | Status: AC

## 2024-04-17 NOTE — Telephone Encounter (Signed)
 I tried to reach someone at the [facility] pharmacy on [address], and I was unable to. I tried twice. I will try again later this afternoon. The patient stated she is unsure why the were unable to fill the prescription.

## 2024-04-22 ENCOUNTER — Encounter: Payer: Self-pay | Admitting: Nurse Practitioner

## 2024-04-22 LAB — HM DIABETES EYE EXAM

## 2024-04-23 ENCOUNTER — Telehealth: Payer: Self-pay

## 2024-04-23 ENCOUNTER — Other Ambulatory Visit (HOSPITAL_COMMUNITY): Payer: Self-pay

## 2024-04-23 NOTE — Telephone Encounter (Signed)
 Pharmacy Patient Advocate Encounter   Received notification from [facility] KEY that prior authorization for Ozempic  (0.25 or 0.5 MG/DOSE) 2MG /3ML pen-injectors is required/requested.   Insurance verification completed.   The patient is insured through [facility] MEDICAID.   Per test claim: PA required; PA submitted to above mentioned insurance via Latent Key/confirmation #/EOC [id number] Status is pending

## 2024-04-24 ENCOUNTER — Other Ambulatory Visit (HOSPITAL_COMMUNITY): Payer: Self-pay

## 2024-04-24 NOTE — Telephone Encounter (Signed)
 Pharmacy Patient Advocate Encounter  Received notification from [facility] MEDICAID that Prior Authorization for Ozempic  (0.25 or 0.5 MG/DOSE) 2MG /3ML pen-injectors  has been APPROVED from 04/23/24 to 04/23/25. Ran test claim, Copay is $4.00. This test claim was processed through [facility]- copay amounts may vary at other pharmacies due to pharmacy/plan contracts, or as the patient moves through the different stages of their insurance plan.   PA #/Case ID/Reference #: [id number]

## 2024-05-06 ENCOUNTER — Other Ambulatory Visit: Payer: Self-pay | Admitting: Nurse Practitioner

## 2024-05-06 NOTE — Telephone Encounter (Unsigned)
 Copied from CRM [id number]. Topic: Clinical - Medication Refill >> May 06, 2024  1:16 PM Victoria B wrote: Medication: Semaglutide ,0.25 or 0.5MG /DOS, (OZEMPIC , 0.25 OR 0.5 MG/DOSE,) 2 MG/3ML SOPN 2nd request Patient states was told by insurance that med went to the wrong pharmacy. I don't show that happened  Has the patient contacted their pharmacy? yes (Agent: If no, request that the patient contact the pharmacy for the refill. If patient does not wish to contact the pharmacy document the reason why and proceed with request.) (Agent: If yes, when and what did the pharmacy advise?)contact pcp  This is the patient's preferred pharmacy:  [facility] [address], [place] - [address] [place] [address] Phone: [phone] Fax: [phone]   Is this the correct pharmacy for this prescription? yes   Has the prescription been filled recently? no  Is the patient out of the medication? yes  Has the patient been seen for an appointment in the last year OR does the patient have an upcoming appointment? yes  Can we respond through MyChart? no  Agent: Please be advised that Rx refills may take up to 3 business days. We ask that you follow-up with your pharmacy.

## 2024-05-07 NOTE — Telephone Encounter (Signed)
 Requested Prescriptions  Refused Prescriptions Disp Refills   Semaglutide ,0.25 or 0.5MG /DOS, (OZEMPIC , 0.25 OR 0.5 MG/DOSE,) 2 MG/3ML SOPN 6 mL 1    Sig: Inject 0.25 mg into the skin once a week. X 4 weeks then increase to 0.5mg  weekly.     Endocrinology:  Diabetes - GLP-1 Receptor Agonists - semaglutide  Failed - 05/07/2024  5:09 PM      Failed - HBA1C in normal range and within 180 days    Hgb A1c MFr Bld  Date Value Ref Range Status  04/16/2024 8.2 (H) 4.8 - 5.6 % Final    Comment:             Prediabetes: 5.7 - 6.4          Diabetes: >6.4          Glycemic control for adults with diabetes: <7.0          Failed - Cr in normal range and within 360 days    Creatinine, Ser  Date Value Ref Range Status  04/16/2024 1.03 (H) 0.57 - 1.00 mg/dL Final         Passed - Valid encounter within last 6 months    Recent Outpatient Visits           3 weeks ago Type 2 diabetes mellitus with ketoacidosis without coma, with long-term current use of insulin  [facility]   [facility] Melvin Pao, NP

## 2024-05-17 ENCOUNTER — Ambulatory Visit: Admitting: Nurse Practitioner
